# Patient Record
Sex: Female | Born: 1950 | Race: White | Hispanic: No | Marital: Married | Smoking: Former smoker
Health system: Southern US, Community
[De-identification: ages and names within clinical notes are randomized; demographics above are authoritative.]

## PROBLEM LIST (undated history)

## (undated) DIAGNOSIS — G20 Parkinson's disease: Secondary | ICD-10-CM

## (undated) DIAGNOSIS — G20A1 Parkinson's disease without dyskinesia, without mention of fluctuations: Secondary | ICD-10-CM

## (undated) DIAGNOSIS — E039 Hypothyroidism, unspecified: Secondary | ICD-10-CM

## (undated) DIAGNOSIS — E119 Type 2 diabetes mellitus without complications: Secondary | ICD-10-CM

## (undated) DIAGNOSIS — F319 Bipolar disorder, unspecified: Secondary | ICD-10-CM

## (undated) HISTORY — PX: ABDOMINAL HYSTERECTOMY: SHX81

## (undated) HISTORY — PX: CHOLECYSTECTOMY: SHX55

## (undated) HISTORY — PX: TONSILLECTOMY: SUR1361

---

## 2002-02-11 ENCOUNTER — Emergency Department (HOSPITAL_COMMUNITY): Admission: EM | Admit: 2002-02-11 | Discharge: 2002-02-11 | Payer: Self-pay | Admitting: Emergency Medicine

## 2004-09-08 ENCOUNTER — Ambulatory Visit: Payer: Self-pay | Admitting: Family Medicine

## 2004-11-20 ENCOUNTER — Ambulatory Visit: Payer: Self-pay | Admitting: Family Medicine

## 2004-12-08 ENCOUNTER — Ambulatory Visit: Payer: Self-pay | Admitting: Family Medicine

## 2005-01-27 ENCOUNTER — Ambulatory Visit: Payer: Self-pay | Admitting: Family Medicine

## 2005-05-12 ENCOUNTER — Ambulatory Visit: Payer: Self-pay | Admitting: Family Medicine

## 2005-11-23 ENCOUNTER — Ambulatory Visit: Payer: Self-pay | Admitting: Family Medicine

## 2006-01-08 ENCOUNTER — Ambulatory Visit: Payer: Self-pay | Admitting: Family Medicine

## 2015-12-29 ENCOUNTER — Encounter (HOSPITAL_COMMUNITY): Payer: Self-pay

## 2015-12-29 ENCOUNTER — Emergency Department (HOSPITAL_COMMUNITY): Payer: Medicaid Other

## 2015-12-29 ENCOUNTER — Emergency Department (HOSPITAL_COMMUNITY)
Admission: EM | Admit: 2015-12-29 | Discharge: 2015-12-30 | Disposition: A | Discharge status: Home / Self Care | Payer: Medicaid Other | Attending: Emergency Medicine | Admitting: Emergency Medicine

## 2015-12-29 DIAGNOSIS — W010XXA Fall on same level from slipping, tripping and stumbling without subsequent striking against object, initial encounter: Secondary | ICD-10-CM | POA: Insufficient documentation

## 2015-12-29 DIAGNOSIS — I10 Essential (primary) hypertension: Secondary | ICD-10-CM | POA: Diagnosis not present

## 2015-12-29 DIAGNOSIS — Z880 Allergy status to penicillin: Secondary | ICD-10-CM | POA: Insufficient documentation

## 2015-12-29 DIAGNOSIS — S40021A Contusion of right upper arm, initial encounter: Secondary | ICD-10-CM

## 2015-12-29 DIAGNOSIS — Y998 Other external cause status: Secondary | ICD-10-CM | POA: Insufficient documentation

## 2015-12-29 DIAGNOSIS — E1165 Type 2 diabetes mellitus with hyperglycemia: Secondary | ICD-10-CM | POA: Diagnosis present

## 2015-12-29 DIAGNOSIS — Y9389 Activity, other specified: Secondary | ICD-10-CM | POA: Insufficient documentation

## 2015-12-29 DIAGNOSIS — R739 Hyperglycemia, unspecified: Secondary | ICD-10-CM

## 2015-12-29 DIAGNOSIS — Y9289 Other specified places as the place of occurrence of the external cause: Secondary | ICD-10-CM | POA: Insufficient documentation

## 2015-12-29 HISTORY — DX: Type 2 diabetes mellitus without complications: E11.9

## 2015-12-29 HISTORY — DX: Parkinson's disease without dyskinesia, without mention of fluctuations: G20.A1

## 2015-12-29 HISTORY — DX: Parkinson's disease: G20

## 2015-12-29 LAB — CBG MONITORING, ED: Glucose-Capillary: 347 mg/dL — ABNORMAL HIGH (ref 65–99)

## 2015-12-29 MED ORDER — ONDANSETRON HCL 4 MG/2ML IJ SOLN
4.0000 mg | Freq: Once | INTRAMUSCULAR | Status: AC
  Administered 2015-12-29: 4 mg via INTRAVENOUS
  Filled 2015-12-29: qty 2

## 2015-12-29 MED ORDER — FENTANYL CITRATE (PF) 100 MCG/2ML IJ SOLN
50.0000 ug | Freq: Once | INTRAMUSCULAR | Status: AC
  Administered 2015-12-29: 50 ug via INTRAVENOUS
  Filled 2015-12-29: qty 2

## 2015-12-29 MED ORDER — INSULIN ASPART 100 UNIT/ML ~~LOC~~ SOLN
10.0000 [IU] | Freq: Once | SUBCUTANEOUS | Status: AC
  Administered 2015-12-29: 10 [IU] via INTRAVENOUS
  Filled 2015-12-29: qty 1

## 2015-12-29 MED ORDER — SODIUM CHLORIDE 0.9 % IV BOLUS (SEPSIS)
1000.0000 mL | Freq: Once | INTRAVENOUS | Status: AC
  Administered 2015-12-29: 1000 mL via INTRAVENOUS

## 2015-12-29 NOTE — ED Provider Notes (Signed)
CSN: [id number]     Arrival date & time 12/29/15  2218 History  By signing my name below, I, Linus GalasMaharshi Patel, attest that this documentation has been prepared under the direction and in the presence of Raeford RazorStephen Valery Chance, MD. Electronically Signed: Linus GalasMaharshi Patel, ED Scribe. 12/29/2015. 11:16 PM.   Chief Complaint  Patient presents with  . Arm Pain  . Hypoglycemia   The history is provided by the patient. No language interpreter was used.   HPI Comments: Alexis Sanders is a 65 y.o. female with a PMHx of DM and HTN  presents to the Emergency Department for an evaluation s/p fall 1 hour prior to arrival. Pt states as she was getting up from a seated position, she lost her balance and fell. Since then she reports right elbow pain. She denies any head injury. Pt denies any HA, neck pain, back pain, fever, chills, nausea, or any other symptoms at this time.     Pt also reports hyperglycemia.   No past medical history on file. No past surgical history on file. No family history on file. Social History  Substance Use Topics  . Smoking status: Not on file  . Smokeless tobacco: Not on file  . Alcohol Use: Not on file   OB History    No data available     Review of Systems  Constitutional: Negative for fever and chills.  Gastrointestinal: Negative for nausea.  Musculoskeletal: Positive for arthralgias. Negative for back pain and neck pain.  Neurological: Negative for headaches.   Allergies  Codeine and Penicillins  Home Medications   Prior to Admission medications   Not on File   BP 158/119 mmHg  Pulse 105  Temp(Src) 98 F (36.7 C)  Resp 19  SpO2 95%   Physical Exam  Constitutional: She is oriented to person, place, and time. She appears well-developed and well-nourished.  HENT:  Head: Normocephalic and atraumatic.  Mouth/Throat: Oropharynx is clear and moist and mucous membranes are normal.  Neck: Normal range of motion. Neck supple.  Cardiovascular: Normal rate, regular  rhythm, normal heart sounds and intact distal pulses.   Pulmonary/Chest: Effort normal and breath sounds normal. No respiratory distress.  Abdominal: Soft. Bowel sounds are normal. She exhibits no distension.  Musculoskeletal: Normal range of motion. She exhibits no edema or tenderness.  Mild tenderness to the distal right humerus above the elbow; no apparent pain with ROM of elbow; no deformity; NVID  Neurological: She is alert and oriented to person, place, and time. She exhibits normal muscle tone. Coordination normal.  Skin: Skin is warm and dry.  Psychiatric: She has a normal mood and affect.  Nursing note and vitals reviewed.   ED Course  Procedures   DIAGNOSTIC STUDIES: Oxygen Saturation is 95% on room air, normal by my interpretation.    COORDINATION OF CARE: 11:11 PM Will give zofran, fluids, insulin, and pain medication. Will order right elbow xray, blood work and urinalysis. Discussed treatment plan with pt at bedside and pt agreed to plan.   Labs Review Labs Reviewed  BASIC METABOLIC PANEL - Abnormal; Notable for the following:    Glucose, Bld 386 (*)    Creatinine, Ser 1.28 (*)    GFR calc non Af Amer 43 (*)    GFR calc Af Amer 50 (*)    All other components within normal limits  CBC - Abnormal; Notable for the following:    WBC 12.3 (*)    Platelets 121 (*)    All other  components within normal limits  URINALYSIS, ROUTINE W REFLEX MICROSCOPIC (NOT AT [facility]) - Abnormal; Notable for the following:    APPearance CLOUDY (*)    Glucose, UA >1000 (*)    Leukocytes, UA SMALL (*)    All other components within normal limits  URINE MICROSCOPIC-ADD ON - Abnormal; Notable for the following:    Squamous Epithelial / LPF 6-30 (*)    Bacteria, UA RARE (*)    All other components within normal limits  CBG MONITORING, ED - Abnormal; Notable for the following:    Glucose-Capillary 347 (*)    All other components within normal limits  CBG MONITORING, ED - Abnormal; Notable for  the following:    Glucose-Capillary 199 (*)    All other components within normal limits    Imaging Review No results found. I have personally reviewed and evaluated these images and lab results as part of my medical decision-making.   EKG Interpretation None      MDM   Final diagnoses:  Arm contusion, right, initial encounter  Hyperglycemia    64yF with R arm pain after mechanical fall. Closed injury. NVI. Negative imaging. Plan symptomatic tx of what is clinically a contusion.  I personally preformed the services scribed in my presence. The recorded information has been reviewed is accurate. Raeford Razor, MD.    Raeford Razor, MD 01/03/16 [place] Moment

## 2015-12-29 NOTE — ED Notes (Signed)
[facility], pt tripped and fell with 10/10 pain right arm. No head injury reported. No deformity. CBG of 398. Last BP of 212/100.

## 2015-12-29 NOTE — ED Notes (Signed)
Patient in XR, unable to reassess pain at this time.

## 2015-12-29 NOTE — ED Notes (Signed)
Family at bedside. 

## 2015-12-29 NOTE — ED Notes (Signed)
Bed: WA07 Expected date:  Expected time:  Means of arrival:  Comments: EMS 64yo F fall / arm pain

## 2015-12-30 ENCOUNTER — Encounter (HOSPITAL_COMMUNITY): Payer: Self-pay | Admitting: Emergency Medicine

## 2015-12-30 LAB — CBG MONITORING, ED: Glucose-Capillary: 199 mg/dL — ABNORMAL HIGH (ref 65–99)

## 2015-12-30 LAB — CBC
HEMATOCRIT: 37.9 % (ref 36.0–46.0)
Hemoglobin: 12.9 g/dL (ref 12.0–15.0)
MCH: 28.4 pg (ref 26.0–34.0)
MCHC: 34 g/dL (ref 30.0–36.0)
MCV: 83.3 fL (ref 78.0–100.0)
PLATELETS: 121 10*3/uL — AB (ref 150–400)
RBC: 4.55 MIL/uL (ref 3.87–5.11)
RDW: 13.1 % (ref 11.5–15.5)
WBC: 12.3 10*3/uL — AB (ref 4.0–10.5)

## 2015-12-30 LAB — BASIC METABOLIC PANEL
Anion gap: 12 (ref 5–15)
BUN: 17 mg/dL (ref 6–20)
CHLORIDE: 101 mmol/L (ref 101–111)
CO2: 25 mmol/L (ref 22–32)
CREATININE: 1.28 mg/dL — AB (ref 0.44–1.00)
Calcium: 9.2 mg/dL (ref 8.9–10.3)
GFR calc Af Amer: 50 mL/min — ABNORMAL LOW (ref >60)
GFR calc non Af Amer: 43 mL/min — ABNORMAL LOW (ref >60)
GLUCOSE: 386 mg/dL — AB (ref 65–99)
POTASSIUM: 4.3 mmol/L (ref 3.5–5.1)
SODIUM: 138 mmol/L (ref 135–145)

## 2015-12-30 LAB — URINALYSIS, ROUTINE W REFLEX MICROSCOPIC
BILIRUBIN URINE: NEGATIVE
HGB URINE DIPSTICK: NEGATIVE
KETONES UR: NEGATIVE mg/dL
Nitrite: NEGATIVE
PH: 7 (ref 5.0–8.0)
PROTEIN: NEGATIVE mg/dL
Specific Gravity, Urine: 1.01 (ref 1.005–1.030)

## 2015-12-30 LAB — URINE MICROSCOPIC-ADD ON

## 2015-12-30 MED ORDER — TRAMADOL HCL 50 MG PO TABS: 50.0000 mg | ORAL_TABLET | Freq: Four times a day (QID) | ORAL | Status: DC | PRN

## 2015-12-30 MED ORDER — MORPHINE SULFATE (PF) 4 MG/ML IV SOLN
4.0000 mg | Freq: Once | INTRAVENOUS | Status: AC
  Administered 2015-12-30: 4 mg via INTRAVENOUS
  Filled 2015-12-30: qty 1

## 2015-12-30 NOTE — Discharge Instructions (Signed)
Contusion °A contusion is a deep bruise. Contusions are the result of a blunt injury to tissues and muscle fibers under the skin. The injury causes bleeding under the skin. The skin overlying the contusion may turn blue, purple, or yellow. Minor injuries will give you a painless contusion, but more severe contusions may stay painful and swollen for a few weeks.  °CAUSES  °This condition is usually caused by a blow, trauma, or direct force to an area of the body. °SYMPTOMS  °Symptoms of this condition include: °· Swelling of the injured area. °· Pain and tenderness in the injured area. °· Discoloration. The area may have redness and then turn blue, purple, or yellow. °DIAGNOSIS  °This condition is diagnosed based on a physical exam and medical history. An X-ray, CT scan, or MRI may be needed to determine if there are any associated injuries, such as broken bones (fractures). °TREATMENT  °Specific treatment for this condition depends on what area of the body was injured. In general, the best treatment for a contusion is resting, icing, applying pressure to (compression), and elevating the injured area. This is often called the RICE strategy. Over-the-counter anti-inflammatory medicines may also be recommended for pain control.  °HOME CARE INSTRUCTIONS  °· Rest the injured area. °· If directed, apply ice to the injured area: °· Put ice in a plastic bag. °· Place a towel between your skin and the bag. °· Leave the ice on for 20 minutes, 2-3 times per day. °· If directed, apply light compression to the injured area using an elastic bandage. Make sure the bandage is not wrapped too tightly. Remove and reapply the bandage as directed by your health care provider. °· If possible, raise (elevate) the injured area above the level of your heart while you are sitting or lying down. °· Take over-the-counter and prescription medicines only as told by your health care provider. °SEEK MEDICAL CARE IF: °· Your symptoms do not  improve after several days of treatment. °· Your symptoms get worse. °· You have difficulty moving the injured area. °SEEK IMMEDIATE MEDICAL CARE IF:  °· You have severe pain. °· You have numbness in a hand or foot. °· Your hand or foot turns pale or cold. °  °This information is not intended to replace advice given to you by your health care provider. Make sure you discuss any questions you have with your health care provider. °  °Document Released: 07/15/2005 Document Revised: 06/26/2015 Document Reviewed: 02/20/2015 °Elsevier Interactive Patient Education ©2016 [employer] °Hyperglycemia °Hyperglycemia occurs when the glucose (sugar) in your blood is too high. Hyperglycemia can happen for many reasons, but it most often happens to people who do not know they have diabetes or are not managing their diabetes properly.  °CAUSES  °Whether you have diabetes or not, there are other causes of hyperglycemia. Hyperglycemia can occur when you have diabetes, but it can also occur in other situations that you might not be as aware of, such as: °Diabetes °· If you have diabetes and are having problems controlling your blood glucose, hyperglycemia could occur because of some of the following reasons: °¨ Not following your meal plan. °¨ Not taking your diabetes medications or not taking it properly. °¨ Exercising less or doing less activity than you normally do. °¨ Being sick. °Pre-diabetes °· This cannot be ignored. Before people develop Type 2 diabetes, they almost always have "pre-diabetes." This is when your blood glucose levels are higher than normal, but not yet high enough   to be diagnosed as diabetes. Research has shown that some long-term damage to the body, especially the heart and circulatory system, may already be occurring during pre-diabetes. If you take action to manage your blood glucose when you have pre-diabetes, you may delay or prevent Type 2 diabetes from developing. °Stress °· If you have diabetes, you  may be "diet" controlled or on oral medications or insulin to control your diabetes. However, you may find that your blood glucose is higher than usual in the hospital whether you have diabetes or not. This is often referred to as "stress hyperglycemia." Stress can elevate your blood glucose. This happens because of hormones put out by the body during times of stress. If stress has been the cause of your high blood glucose, it can be followed regularly by your caregiver. That way he/she can make sure your hyperglycemia does not continue to get worse or progress to diabetes. °Steroids °· Steroids are medications that act on the infection fighting system (immune system) to block inflammation or infection. One side effect can be a rise in blood glucose. Most people can produce enough extra insulin to allow for this rise, but for those who cannot, steroids make blood glucose levels go even higher. It is not unusual for steroid treatments to "uncover" diabetes that is developing. It is not always possible to determine if the hyperglycemia will go away after the steroids are stopped. A special blood test called an A1c is sometimes done to determine if your blood glucose was elevated before the steroids were started. °SYMPTOMS °· Thirsty. °· Frequent urination. °· Dry mouth. °· Blurred vision. °· Tired or fatigue. °· Weakness. °· Sleepy. °· Tingling in feet or leg. °DIAGNOSIS  °Diagnosis is made by monitoring blood glucose in one or all of the following ways: °· A1c test. This is a chemical found in your blood. °· Fingerstick blood glucose monitoring. °· Laboratory results. °TREATMENT  °First, knowing the cause of the hyperglycemia is important before the hyperglycemia can be treated. Treatment may include, but is not be limited to: °· Education. °· Change or adjustment in medications. °· Change or adjustment in meal plan. °· Treatment for an illness, infection, etc. °· More frequent blood glucose monitoring. °· Change in  exercise plan. °· Decreasing or stopping steroids. °· Lifestyle changes. °HOME CARE INSTRUCTIONS  °· Test your blood glucose as directed. °· Exercise regularly. Your caregiver will give you instructions about exercise. Pre-diabetes or diabetes which comes on with stress is helped by exercising. °· Eat wholesome, balanced meals. Eat often and at regular, fixed times. Your caregiver or nutritionist will give you a meal plan to guide your sugar intake. °· Being at an ideal weight is important. If needed, losing as little as 10 to 15 pounds may help improve blood glucose levels. °SEEK MEDICAL CARE IF:  °· You have questions about medicine, activity, or diet. °· You continue to have symptoms (problems such as increased thirst, urination, or weight gain). °SEEK IMMEDIATE MEDICAL CARE IF:  °· You are vomiting or have diarrhea. °· Your breath smells fruity. °· You are breathing faster or slower. °· You are very sleepy or incoherent. °· You have numbness, tingling, or pain in your feet or hands. °· You have chest pain. °· Your symptoms get worse even though you have been following your caregiver's orders. °· If you have any other questions or concerns. °  °This information is not intended to replace advice given to you by your health care provider.   Make sure you discuss any questions you have with your health care provider. °  °Document Released: 03/31/2001 Document Revised: 12/28/2011 Document Reviewed: 06/11/2015 °Elsevier Interactive Patient Education ©2016 Elsevier Inc. ° °

## 2016-08-17 ENCOUNTER — Encounter (HOSPITAL_COMMUNITY): Payer: Self-pay

## 2016-08-17 ENCOUNTER — Emergency Department (HOSPITAL_COMMUNITY): Payer: Medicare Other

## 2016-08-17 ENCOUNTER — Inpatient Hospital Stay (HOSPITAL_COMMUNITY)
Admission: EM | Admit: 2016-08-17 | Discharge: 2016-09-04 | DRG: 871 | Disposition: A | Discharge status: Skilled Nursing Facility | Payer: Medicare Other | Attending: Family Medicine | Admitting: Family Medicine

## 2016-08-17 DIAGNOSIS — E039 Hypothyroidism, unspecified: Secondary | ICD-10-CM | POA: Diagnosis present

## 2016-08-17 DIAGNOSIS — G9340 Encephalopathy, unspecified: Secondary | ICD-10-CM | POA: Diagnosis present

## 2016-08-17 DIAGNOSIS — A419 Sepsis, unspecified organism: Secondary | ICD-10-CM | POA: Diagnosis present

## 2016-08-17 DIAGNOSIS — D689 Coagulation defect, unspecified: Secondary | ICD-10-CM | POA: Diagnosis present

## 2016-08-17 DIAGNOSIS — J690 Pneumonitis due to inhalation of food and vomit: Secondary | ICD-10-CM | POA: Diagnosis present

## 2016-08-17 DIAGNOSIS — N183 Chronic kidney disease, stage 3 unspecified: Secondary | ICD-10-CM

## 2016-08-17 DIAGNOSIS — E876 Hypokalemia: Secondary | ICD-10-CM | POA: Diagnosis present

## 2016-08-17 DIAGNOSIS — N179 Acute kidney failure, unspecified: Secondary | ICD-10-CM | POA: Diagnosis present

## 2016-08-17 DIAGNOSIS — G2119 Other drug induced secondary parkinsonism: Secondary | ICD-10-CM | POA: Diagnosis present

## 2016-08-17 DIAGNOSIS — E878 Other disorders of electrolyte and fluid balance, not elsewhere classified: Secondary | ICD-10-CM | POA: Diagnosis present

## 2016-08-17 DIAGNOSIS — E1151 Type 2 diabetes mellitus with diabetic peripheral angiopathy without gangrene: Secondary | ICD-10-CM | POA: Diagnosis present

## 2016-08-17 DIAGNOSIS — G20A1 Parkinson's disease without dyskinesia, without mention of fluctuations: Secondary | ICD-10-CM | POA: Diagnosis present

## 2016-08-17 DIAGNOSIS — F419 Anxiety disorder, unspecified: Secondary | ICD-10-CM | POA: Diagnosis present

## 2016-08-17 DIAGNOSIS — E1122 Type 2 diabetes mellitus with diabetic chronic kidney disease: Secondary | ICD-10-CM | POA: Diagnosis present

## 2016-08-17 DIAGNOSIS — Z87891 Personal history of nicotine dependence: Secondary | ICD-10-CM

## 2016-08-17 DIAGNOSIS — N390 Urinary tract infection, site not specified: Secondary | ICD-10-CM | POA: Diagnosis present

## 2016-08-17 DIAGNOSIS — Z885 Allergy status to narcotic agent status: Secondary | ICD-10-CM

## 2016-08-17 DIAGNOSIS — E1165 Type 2 diabetes mellitus with hyperglycemia: Secondary | ICD-10-CM | POA: Diagnosis present

## 2016-08-17 DIAGNOSIS — Z794 Long term (current) use of insulin: Secondary | ICD-10-CM

## 2016-08-17 DIAGNOSIS — E869 Volume depletion, unspecified: Secondary | ICD-10-CM | POA: Diagnosis present

## 2016-08-17 DIAGNOSIS — R509 Fever, unspecified: Secondary | ICD-10-CM | POA: Diagnosis not present

## 2016-08-17 DIAGNOSIS — N170 Acute kidney failure with tubular necrosis: Secondary | ICD-10-CM | POA: Diagnosis not present

## 2016-08-17 DIAGNOSIS — J9601 Acute respiratory failure with hypoxia: Secondary | ICD-10-CM

## 2016-08-17 DIAGNOSIS — Z789 Other specified health status: Secondary | ICD-10-CM

## 2016-08-17 DIAGNOSIS — E43 Unspecified severe protein-calorie malnutrition: Secondary | ICD-10-CM | POA: Diagnosis present

## 2016-08-17 DIAGNOSIS — Z888 Allergy status to other drugs, medicaments and biological substances status: Secondary | ICD-10-CM

## 2016-08-17 DIAGNOSIS — F319 Bipolar disorder, unspecified: Secondary | ICD-10-CM | POA: Diagnosis present

## 2016-08-17 DIAGNOSIS — M6281 Muscle weakness (generalized): Secondary | ICD-10-CM

## 2016-08-17 DIAGNOSIS — A4102 Sepsis due to Methicillin resistant Staphylococcus aureus: Principal | ICD-10-CM | POA: Diagnosis present

## 2016-08-17 DIAGNOSIS — J9600 Acute respiratory failure, unspecified whether with hypoxia or hypercapnia: Secondary | ICD-10-CM | POA: Diagnosis not present

## 2016-08-17 DIAGNOSIS — E038 Other specified hypothyroidism: Secondary | ICD-10-CM | POA: Diagnosis not present

## 2016-08-17 DIAGNOSIS — G20 Parkinson's disease: Secondary | ICD-10-CM | POA: Diagnosis present

## 2016-08-17 DIAGNOSIS — R6521 Severe sepsis with septic shock: Secondary | ICD-10-CM | POA: Diagnosis present

## 2016-08-17 DIAGNOSIS — E875 Hyperkalemia: Secondary | ICD-10-CM | POA: Diagnosis present

## 2016-08-17 DIAGNOSIS — R1310 Dysphagia, unspecified: Secondary | ICD-10-CM | POA: Diagnosis not present

## 2016-08-17 DIAGNOSIS — D6489 Other specified anemias: Secondary | ICD-10-CM | POA: Diagnosis present

## 2016-08-17 DIAGNOSIS — E119 Type 2 diabetes mellitus without complications: Secondary | ICD-10-CM

## 2016-08-17 DIAGNOSIS — Z7984 Long term (current) use of oral hypoglycemic drugs: Secondary | ICD-10-CM

## 2016-08-17 DIAGNOSIS — Z7189 Other specified counseling: Secondary | ICD-10-CM

## 2016-08-17 DIAGNOSIS — D638 Anemia in other chronic diseases classified elsewhere: Secondary | ICD-10-CM | POA: Diagnosis present

## 2016-08-17 DIAGNOSIS — Z880 Allergy status to penicillin: Secondary | ICD-10-CM

## 2016-08-17 DIAGNOSIS — R0602 Shortness of breath: Secondary | ICD-10-CM

## 2016-08-17 DIAGNOSIS — Z79899 Other long term (current) drug therapy: Secondary | ICD-10-CM

## 2016-08-17 DIAGNOSIS — E870 Hyperosmolality and hypernatremia: Secondary | ICD-10-CM | POA: Diagnosis present

## 2016-08-17 DIAGNOSIS — G9341 Metabolic encephalopathy: Secondary | ICD-10-CM | POA: Diagnosis present

## 2016-08-17 DIAGNOSIS — Z515 Encounter for palliative care: Secondary | ICD-10-CM | POA: Diagnosis present

## 2016-08-17 DIAGNOSIS — R1312 Dysphagia, oropharyngeal phase: Secondary | ICD-10-CM | POA: Diagnosis present

## 2016-08-17 DIAGNOSIS — R0600 Dyspnea, unspecified: Secondary | ICD-10-CM

## 2016-08-17 DIAGNOSIS — J9690 Respiratory failure, unspecified, unspecified whether with hypoxia or hypercapnia: Secondary | ICD-10-CM

## 2016-08-17 DIAGNOSIS — Z4659 Encounter for fitting and adjustment of other gastrointestinal appliance and device: Secondary | ICD-10-CM

## 2016-08-17 HISTORY — DX: Hypothyroidism, unspecified: E03.9

## 2016-08-17 LAB — COMPREHENSIVE METABOLIC PANEL
ALT: 20 U/L (ref 14–54)
AST: 24 U/L (ref 15–41)
Albumin: 2.9 g/dL — ABNORMAL LOW (ref 3.5–5.0)
Alkaline Phosphatase: 136 U/L — ABNORMAL HIGH (ref 38–126)
Anion gap: 10 (ref 5–15)
BUN: 31 mg/dL — ABNORMAL HIGH (ref 6–20)
CHLORIDE: 109 mmol/L (ref 101–111)
CO2: 23 mmol/L (ref 22–32)
CREATININE: 2.26 mg/dL — AB (ref 0.44–1.00)
Calcium: 9.6 mg/dL (ref 8.9–10.3)
GFR calc non Af Amer: 22 mL/min — ABNORMAL LOW (ref >60)
GFR, EST AFRICAN AMERICAN: 25 mL/min — AB (ref >60)
Glucose, Bld: 116 mg/dL — ABNORMAL HIGH (ref 65–99)
Potassium: 4.5 mmol/L (ref 3.5–5.1)
SODIUM: 142 mmol/L (ref 135–145)
Total Bilirubin: 0.8 mg/dL (ref 0.3–1.2)
Total Protein: 7.1 g/dL (ref 6.5–8.1)

## 2016-08-17 LAB — CBC WITH DIFFERENTIAL/PLATELET
Basophils Absolute: 0 10*3/uL (ref 0.0–0.1)
Basophils Relative: 0 %
EOS ABS: 0 10*3/uL (ref 0.0–0.7)
Eosinophils Relative: 0 %
HEMATOCRIT: 31.5 % — AB (ref 36.0–46.0)
HEMOGLOBIN: 10.1 g/dL — AB (ref 12.0–15.0)
LYMPHS ABS: 0.8 10*3/uL (ref 0.7–4.0)
LYMPHS PCT: 11 %
MCH: 26.6 pg (ref 26.0–34.0)
MCHC: 32.1 g/dL (ref 30.0–36.0)
MCV: 83.1 fL (ref 78.0–100.0)
MONOS PCT: 8 %
Monocytes Absolute: 0.6 10*3/uL (ref 0.1–1.0)
NEUTROS ABS: 5.4 10*3/uL (ref 1.7–7.7)
NEUTROS PCT: 81 %
Platelets: 198 10*3/uL (ref 150–400)
RBC: 3.79 MIL/uL — ABNORMAL LOW (ref 3.87–5.11)
RDW: 13.6 % (ref 11.5–15.5)
WBC: 6.8 10*3/uL (ref 4.0–10.5)

## 2016-08-17 LAB — URINALYSIS, ROUTINE W REFLEX MICROSCOPIC
BILIRUBIN URINE: NEGATIVE
Glucose, UA: NEGATIVE mg/dL
HGB URINE DIPSTICK: NEGATIVE
Ketones, ur: 15 mg/dL — AB
Nitrite: NEGATIVE
PH: 5.5 (ref 5.0–8.0)
Protein, ur: NEGATIVE mg/dL
SPECIFIC GRAVITY, URINE: 1.012 (ref 1.005–1.030)

## 2016-08-17 LAB — URINE MICROSCOPIC-ADD ON
BACTERIA UA: NONE SEEN
RBC / HPF: NONE SEEN RBC/hpf (ref 0–5)

## 2016-08-17 LAB — I-STAT CG4 LACTIC ACID, ED
LACTIC ACID, VENOUS: 1.78 mmol/L (ref 0.5–1.9)
LACTIC ACID, VENOUS: 0.6 mmol/L (ref 0.5–1.9)

## 2016-08-17 MED ORDER — DEXTROSE 5 % IV SOLN
500.0000 mg | INTRAVENOUS | Status: DC
  Administered 2016-08-18 – 2016-08-20 (×3): 500 mg via INTRAVENOUS
  Filled 2016-08-17 (×6): qty 500

## 2016-08-17 MED ORDER — DEXTROSE 5 % IV SOLN
1.0000 g | Freq: Once | INTRAVENOUS | Status: AC
  Administered 2016-08-17: 1 g via INTRAVENOUS
  Filled 2016-08-17: qty 10

## 2016-08-17 MED ORDER — DEXTROSE 5 % IV SOLN
1.0000 g | INTRAVENOUS | Status: DC
  Filled 2016-08-17: qty 10

## 2016-08-17 MED ORDER — SODIUM CHLORIDE 0.9 % IV BOLUS (SEPSIS)
1500.0000 mL | Freq: Once | INTRAVENOUS | Status: AC
  Administered 2016-08-17: 1500 mL via INTRAVENOUS

## 2016-08-17 MED ORDER — DEXTROSE 5 % IV SOLN
500.0000 mg | Freq: Once | INTRAVENOUS | Status: AC
  Administered 2016-08-17: 500 mg via INTRAVENOUS
  Filled 2016-08-17: qty 500

## 2016-08-17 NOTE — ED Provider Notes (Addendum)
[facility]-EMERGENCY DEPT Provider Note   CSN: [id number] Arrival date & time: 08/17/16  1725     History   Chief Complaint Chief Complaint  Patient presents with  . Altered Mental Status  . Shortness of Breath  Level V caveat altered mental status history obtained from patient's husband who accompanies her and from records are coming patient. Patient has been more confused, less verbal over the past 3 days. She's also had slight cough. [facility], treated for community-acquired pneumonia with prescriptions for albuterol Levaquin and promethazine. She presents here by EMS treated by EMS with supplemental oxygen. She presents as she remains confused with speech unintelligible. She's had no vomiting no other associated symptoms  HPI Alexis Sanders is a 65 y.o. female.  HPI  Past Medical History:  Diagnosis Date  . Diabetes mellitus without complication (HCC)   . Parkinson disease (HCC)     There are no active problems to display for this patient.   Past Surgical History:  Procedure Laterality Date  . ABDOMINAL HYSTERECTOMY    . CHOLECYSTECTOMY    . TONSILLECTOMY      OB History    No data available       Home Medications    Prior to Admission medications   Medication Sig Start Date End Date Taking? Authorizing Provider  amantadine (SYMMETREL) 100 MG capsule Take 100 mg by mouth 2 (two) times daily.   Yes Historical Provider, MD  ARIPiprazole (ABILIFY) 10 MG tablet Take 10 mg by mouth every evening.   Yes Historical Provider, MD  buPROPion (WELLBUTRIN) 100 MG tablet Take 100 mg by mouth daily.   Yes Historical Provider, MD  clonazePAM (KLONOPIN) 1 MG tablet Take 1 mg by mouth 2 (two) times daily.   Yes Historical Provider, MD  DULoxetine (CYMBALTA) 30 MG capsule Take 30 mg by mouth 2 (two) times daily.   Yes Historical Provider, MD  gabapentin (NEURONTIN) 300 MG capsule Take 600 mg by mouth 2 (two) times daily.   Yes Historical Provider,  MD  glipiZIDE (GLUCOTROL XL) 2.5 MG 24 hr tablet Take 2.5 mg by mouth daily with breakfast.   Yes Historical Provider, MD  levofloxacin (LEVAQUIN) 750 MG tablet Take 750 mg by mouth daily.   Yes Historical Provider, MD  levothyroxine (SYNTHROID, LEVOTHROID) 125 MCG tablet Take 125 mcg by mouth daily before breakfast.   Yes Historical Provider, MD  montelukast (SINGULAIR) 10 MG tablet Take 10 mg by mouth at bedtime.   Yes Historical Provider, MD  pantoprazole (PROTONIX) 40 MG tablet Take 40 mg by mouth every evening.   Yes Historical Provider, MD  promethazine (PHENERGAN) 25 MG tablet Take 25 mg by mouth every 6 (six) hours as needed for nausea or vomiting.   Yes Historical Provider, MD  solifenacin (VESICARE) 5 MG tablet Take 5 mg by mouth daily.   Yes Historical Provider, MD  traZODone (DESYREL) 50 MG tablet Take 25 mg by mouth at bedtime.   Yes Historical Provider, MD    Family History History reviewed. No pertinent family history.  Social History Social History  Substance Use Topics  . Smoking status: Never Smoker  . Smokeless tobacco: Never Used  . Alcohol use No     Allergies   Codeine; Metformin; and Penicillins   Review of Systems Review of Systems  Unable to perform ROS: Mental status change  Respiratory: Positive for cough.   Allergic/Immunologic: Positive for immunocompromised state.  Diabetic  Neurological: Positive for tremors.       Chronic tremor     Physical Exam Updated Vital Signs BP 110/76   Pulse 87   Temp 100.2 F (37.9 C) (Rectal)   Resp 22   SpO2 98%   Physical Exam  Constitutional:  Chronically and acutely ill-appearing  HENT:  Head: Normocephalic and atraumatic.  Mucous membranes dry  Eyes: Conjunctivae are normal. Pupils are equal, round, and reactive to light.  Neck: Neck supple. No tracheal deviation present. No thyromegaly present.  Cardiovascular: Normal rate and regular rhythm.   No murmur heard. Pulmonary/Chest: Effort  normal and breath sounds normal.  Abdominal: Soft. Bowel sounds are normal. She exhibits no distension. There is no tenderness.  Musculoskeletal: Normal range of motion. She exhibits no edema or tenderness.  Neurological: She is alert. Coordination normal.  moves all extremities, tremor at rest. Speech unintelligible  Skin: Skin is warm and dry. No rash noted.  Psychiatric: She has a normal mood and affect.  Nursing note and vitals reviewed.  Radiology department called. Yesterday's chest x-ray report consistent with basilar densities concerning for pneumonia  ED Treatments / Results  Labs (all labs ordered are listed, but only abnormal results are displayed) Labs Reviewed  CULTURE, BLOOD (ROUTINE X 2)  CULTURE, BLOOD (ROUTINE X 2)  URINE CULTURE  COMPREHENSIVE METABOLIC PANEL  CBC WITH DIFFERENTIAL/PLATELET  URINALYSIS, ROUTINE W REFLEX MICROSCOPIC (NOT AT [facility])  I-STAT CG4 LACTIC ACID, ED    EKG  EKG Interpretation  Date/Time:  Monday August 17 2016 17:50:03 EDT Ventricular Rate:  96 PR Interval:    QRS Duration: 104 QT Interval:  375 QTC Calculation: 474 R Axis:   5 Text Interpretation:  Sinus rhythm Probable left ventricular hypertrophy Baseline wander in lead(s) V6 Confirmed by Ethelda ChickJACUBOWITZ  MD, Mathis Cashman ([id number]) on 08/17/2016 6:12:19 PM       Radiology No results found.  Procedures Procedures (including critical care time)  Medications Ordered in ED Medications  cefTRIAXone (ROCEPHIN) 1 g in dextrose 5 % 50 mL IVPB (not administered)  azithromycin (ZITHROMAX) 500 mg in dextrose 5 % 250 mL IVPB (not administered)    Results for orders placed or performed during the hospital encounter of 08/17/16  Comprehensive metabolic panel  Result Value Ref Range   Sodium 142 135 - 145 mmol/L   Potassium 4.5 3.5 - 5.1 mmol/L   Chloride 109 101 - 111 mmol/L   CO2 23 22 - 32 mmol/L   Glucose, Bld 116 (H) 65 - 99 mg/dL   BUN 31 (H) 6 - 20 mg/dL   Creatinine, Ser 9.142.26 (H) 0.44  - 1.00 mg/dL   Calcium 9.6 8.9 - 78.210.3 mg/dL   Total Protein 7.1 6.5 - 8.1 g/dL   Albumin 2.9 (L) 3.5 - 5.0 g/dL   AST 24 15 - 41 U/L   ALT 20 14 - 54 U/L   Alkaline Phosphatase 136 (H) 38 - 126 U/L   Total Bilirubin 0.8 0.3 - 1.2 mg/dL   GFR calc non Af Amer 22 (L) >60 mL/min   GFR calc Af Amer 25 (L) >60 mL/min   Anion gap 10 5 - 15  CBC WITH DIFFERENTIAL  Result Value Ref Range   WBC 6.8 4.0 - 10.5 K/uL   RBC 3.79 (L) 3.87 - 5.11 MIL/uL   Hemoglobin 10.1 (L) 12.0 - 15.0 g/dL   HCT 95.631.5 (L) 21.336.0 - 08.646.0 %   MCV 83.1 78.0 - 100.0 fL   MCH 26.6  26.0 - 34.0 pg   MCHC 32.1 30.0 - 36.0 g/dL   RDW 16.1 09.6 - 04.5 %   Platelets 198 150 - 400 K/uL   Neutrophils Relative % 81 %   Neutro Abs 5.4 1.7 - 7.7 K/uL   Lymphocytes Relative 11 %   Lymphs Abs 0.8 0.7 - 4.0 K/uL   Monocytes Relative 8 %   Monocytes Absolute 0.6 0.1 - 1.0 K/uL   Eosinophils Relative 0 %   Eosinophils Absolute 0.0 0.0 - 0.7 K/uL   Basophils Relative 0 %   Basophils Absolute 0.0 0.0 - 0.1 K/uL  Urinalysis, Routine w reflex microscopic (not at [facility])  Result Value Ref Range   Color, Urine YELLOW YELLOW   APPearance CLOUDY (A) CLEAR   Specific Gravity, Urine 1.012 1.005 - 1.030   pH 5.5 5.0 - 8.0   Glucose, UA NEGATIVE NEGATIVE mg/dL   Hgb urine dipstick NEGATIVE NEGATIVE   Bilirubin Urine NEGATIVE NEGATIVE   Ketones, ur 15 (A) NEGATIVE mg/dL   Protein, ur NEGATIVE NEGATIVE mg/dL   Nitrite NEGATIVE NEGATIVE   Leukocytes, UA LARGE (A) NEGATIVE  Urine microscopic-add on  Result Value Ref Range   Squamous Epithelial / LPF 6-30 (A) NONE SEEN   WBC, UA TOO NUMEROUS TO COUNT 0 - 5 WBC/hpf   RBC / HPF NONE SEEN 0 - 5 RBC/hpf   Bacteria, UA NONE SEEN NONE SEEN  I-Stat CG4 Lactic Acid, ED  (not at  [facility])  Result Value Ref Range   Lactic Acid, Venous 1.78 0.5 - 1.9 mmol/L   Dg Chest 2 View  Result Date: 08/17/2016 CLINICAL DATA:  Altered mental status and shortness of breath EXAM: CHEST  2 VIEW COMPARISON:   None. FINDINGS: There is patchy airspace consolidation in both lower lobes and right upper lobe regions. Lungs elsewhere clear. Heart is borderline enlarged with pulmonary vascularity within normal limits. No adenopathy. No bone lesions. IMPRESSION: Patchy airspace consolidation bilaterally, somewhat more on the right than on the left. While pneumonia can present in this manner, aspiration is a differential consideration given the overall appearance and distribution of abnormality. There is borderline cardiomegaly. Followup PA and lateral chest radiographs recommended in 3-4 weeks following trial of antibiotic therapy to ensure resolution and exclude underlying malignancy. Electronically Signed   By: Bretta Bang III M.D.   On: 08/17/2016 20:42   Initial Impression / Assessment and Plan / ED Course  I have reviewed the triage vital signs and the nursing notes.  Pertinent labs & imaging results that were available during my care of the patient were reviewed by me and considered in my medical decision making (see chart for details).  Clinical Course   9:15 PM patient is alert and talkative after treatment with intravenous antibiotics. IV fluid bolus ordered total 2.5 L after lab results back. Dr.Kakrakandy consulted. Plan admit step down unit   Final Clinical Impressions(s) / ED Diagnoses  Diagnosis #1 community-acquired pneumonia #2 septic shock #3 acute kidney injury Final diagnoses:  None  #4 anemia New Prescriptions New Prescriptions   No medications on file  CRITICAL CARE Performed by: Doug Sou Total critical care time: 30 minutes Critical care time was exclusive of separately billable procedures and treating other patients. Critical care was necessary to treat or prevent imminent or life-threatening deterioration. Critical care was time spent personally by me on the following activities: development of treatment plan with patient and/or surrogate as well as nursing,  discussions with consultants, evaluation of patient's response  to treatment, examination of patient, obtaining history from patient or surrogate, ordering and performing treatments and interventions, ordering and review of laboratory studies, ordering and review of radiographic studies, pulse oximetry and re-evaluation of patient's condition.   Doug Sou, MD 08/17/16 2122    Doug Sou, MD 08/17/16 2123

## 2016-08-17 NOTE — Progress Notes (Signed)
Pharmacy Antibiotic Note  Alexis HoleBetty S Sanders is a 65 y.o. female admitted on 08/17/2016 with pneumonia.  Pharmacy has been consulted for ceftriaxone and azithromycin dosing. CC AMS and SOB. [facility], dx w/ PNA and given levofloxacin PO for tx.   Plan: Ceftriaxone 1g IV q24h Azithromycin 500 mg q24h   Height: 5\' 2"  (157.5 cm) Weight: 160 lb (72.6 kg) IBW/kg (Calculated) : 50.1  Temp (24hrs), Avg:100.2 F (37.9 C), Min:100.1 F (37.8 C), Max:100.2 F (37.9 C)   Recent Labs Lab 08/17/16 1913  LATICACIDVEN 1.78    CrCl cannot be calculated (Patient's most recent lab result is older than the maximum 21 days allowed.).    Allergies  Allergen Reactions  . Codeine Nausea Only  . Metformin Diarrhea  . Penicillins Rash    Has patient had a PCN reaction causing immediate rash, facial/tongue/throat swelling, SOB or lightheadedness with hypotension:Yes Has patient had a PCN reaction causing severe rash involving mucus membranes or skin necrosis:unsure. Has patient had a PCN reaction that required hospitalization:No Has patient had a PCN reaction occurring within the last 10 years:No If all of the above answers are "NO", then may proceed with Cephalosporin use. Has patient had a PCN reaction causing immediate rash, faci    Antimicrobials this admission: Ceftriaxone 10/30 >> Azithromycin 10/30 >>  Dose adjustments this admission: N/A  Microbiology results: 10/30 BCx: Sent 10/30 UCx: Sent  Thank you for allowing pharmacy to be a part of this patient's care.  Lianne CureJustin R Alyssandra Hulsebus, PharmD Candidate 08/17/2016 7:25 PM

## 2016-08-17 NOTE — ED Notes (Signed)
Transported to xray 

## 2016-08-17 NOTE — ED Triage Notes (Signed)
Pt. Coming from home via [facility] and increased work of breathing per son. Son is primary caretaker. Pt. Hx of parkinson's. Pt. Seen yesterday at [place] [facility] and dx with pneumonia and sent home with antibiotics. Pt. 98% on home oxygen at 2L.

## 2016-08-17 NOTE — ED Notes (Signed)
Retake of pt's ECG d/t pt having Parkinson's.

## 2016-08-17 NOTE — ED Notes (Signed)
Spoke to Dr. Ferrel LoganJacubowite about need of fluids. States: "will need lab results first"

## 2016-08-18 ENCOUNTER — Encounter (HOSPITAL_COMMUNITY): Payer: Self-pay | Admitting: Internal Medicine

## 2016-08-18 ENCOUNTER — Inpatient Hospital Stay (HOSPITAL_COMMUNITY): Payer: Medicare Other

## 2016-08-18 DIAGNOSIS — F319 Bipolar disorder, unspecified: Secondary | ICD-10-CM | POA: Diagnosis present

## 2016-08-18 DIAGNOSIS — N390 Urinary tract infection, site not specified: Secondary | ICD-10-CM | POA: Diagnosis present

## 2016-08-18 DIAGNOSIS — J690 Pneumonitis due to inhalation of food and vomit: Secondary | ICD-10-CM

## 2016-08-18 DIAGNOSIS — E119 Type 2 diabetes mellitus without complications: Secondary | ICD-10-CM

## 2016-08-18 DIAGNOSIS — E039 Hypothyroidism, unspecified: Secondary | ICD-10-CM | POA: Diagnosis present

## 2016-08-18 DIAGNOSIS — N179 Acute kidney failure, unspecified: Secondary | ICD-10-CM

## 2016-08-18 DIAGNOSIS — G20 Parkinson's disease: Secondary | ICD-10-CM | POA: Diagnosis present

## 2016-08-18 DIAGNOSIS — G9340 Encephalopathy, unspecified: Secondary | ICD-10-CM | POA: Diagnosis present

## 2016-08-18 DIAGNOSIS — N183 Chronic kidney disease, stage 3 (moderate): Secondary | ICD-10-CM

## 2016-08-18 LAB — CBC WITH DIFFERENTIAL/PLATELET
Basophils Absolute: 0 10*3/uL (ref 0.0–0.1)
Basophils Relative: 0 %
EOS ABS: 0.1 10*3/uL (ref 0.0–0.7)
EOS PCT: 1 %
HCT: 28.5 % — ABNORMAL LOW (ref 36.0–46.0)
Hemoglobin: 9.1 g/dL — ABNORMAL LOW (ref 12.0–15.0)
LYMPHS ABS: 0.9 10*3/uL (ref 0.7–4.0)
Lymphocytes Relative: 14 %
MCH: 26.4 pg (ref 26.0–34.0)
MCHC: 31.9 g/dL (ref 30.0–36.0)
MCV: 82.6 fL (ref 78.0–100.0)
MONO ABS: 0.6 10*3/uL (ref 0.1–1.0)
MONOS PCT: 10 %
Neutro Abs: 4.9 10*3/uL (ref 1.7–7.7)
Neutrophils Relative %: 75 %
PLATELETS: 167 10*3/uL (ref 150–400)
RBC: 3.45 MIL/uL — AB (ref 3.87–5.11)
RDW: 13.3 % (ref 11.5–15.5)
WBC: 6.5 10*3/uL (ref 4.0–10.5)

## 2016-08-18 LAB — INFLUENZA PANEL BY PCR (TYPE A & B)
H1N1 flu by pcr: NOT DETECTED
Influenza A By PCR: NEGATIVE
Influenza B By PCR: NEGATIVE

## 2016-08-18 LAB — LACTIC ACID, PLASMA
LACTIC ACID, VENOUS: 0.6 mmol/L (ref 0.5–1.9)
LACTIC ACID, VENOUS: 0.7 mmol/L (ref 0.5–1.9)

## 2016-08-18 LAB — GLUCOSE, CAPILLARY
GLUCOSE-CAPILLARY: 75 mg/dL (ref 65–99)
GLUCOSE-CAPILLARY: 68 mg/dL (ref 65–99)
GLUCOSE-CAPILLARY: 108 mg/dL — AB (ref 65–99)
Glucose-Capillary: 137 mg/dL — ABNORMAL HIGH (ref 65–99)
Glucose-Capillary: 109 mg/dL — ABNORMAL HIGH (ref 65–99)
Glucose-Capillary: 114 mg/dL — ABNORMAL HIGH (ref 65–99)
Glucose-Capillary: 120 mg/dL — ABNORMAL HIGH (ref 65–99)

## 2016-08-18 LAB — PROTIME-INR
INR: 1.35
Prothrombin Time: 16.8 seconds — ABNORMAL HIGH (ref 11.4–15.2)

## 2016-08-18 LAB — COMPREHENSIVE METABOLIC PANEL
ALT: 17 U/L (ref 14–54)
ANION GAP: 8 (ref 5–15)
AST: 18 U/L (ref 15–41)
Albumin: 2.3 g/dL — ABNORMAL LOW (ref 3.5–5.0)
Alkaline Phosphatase: 106 U/L (ref 38–126)
BUN: 25 mg/dL — ABNORMAL HIGH (ref 6–20)
CHLORIDE: 118 mmol/L — AB (ref 101–111)
CO2: 18 mmol/L — AB (ref 22–32)
Calcium: 8.3 mg/dL — ABNORMAL LOW (ref 8.9–10.3)
Creatinine, Ser: 1.93 mg/dL — ABNORMAL HIGH (ref 0.44–1.00)
GFR, EST AFRICAN AMERICAN: 30 mL/min — AB (ref >60)
GFR, EST NON AFRICAN AMERICAN: 26 mL/min — AB (ref >60)
Glucose, Bld: 79 mg/dL (ref 65–99)
Potassium: 3.9 mmol/L (ref 3.5–5.1)
SODIUM: 144 mmol/L (ref 135–145)
Total Bilirubin: 0.9 mg/dL (ref 0.3–1.2)
Total Protein: 5.8 g/dL — ABNORMAL LOW (ref 6.5–8.1)

## 2016-08-18 LAB — STREP PNEUMONIAE URINARY ANTIGEN: Strep Pneumo Urinary Antigen: NEGATIVE

## 2016-08-18 LAB — MRSA PCR SCREENING: MRSA by PCR: POSITIVE — AB

## 2016-08-18 LAB — APTT: APTT: 49 s — AB (ref 24–36)

## 2016-08-18 LAB — FIBRINOGEN: Fibrinogen: 739 mg/dL — ABNORMAL HIGH (ref 210–475)

## 2016-08-18 LAB — PROCALCITONIN: PROCALCITONIN: 0.35 ng/mL

## 2016-08-18 LAB — TSH: TSH: 0.938 u[IU]/mL (ref 0.350–4.500)

## 2016-08-18 MED ORDER — LEVOTHYROXINE SODIUM 100 MCG IV SOLR
62.5000 ug | Freq: Every day | INTRAVENOUS | Status: DC
  Administered 2016-08-18 – 2016-08-21 (×4): 62.5 ug via INTRAVENOUS
  Filled 2016-08-18 (×4): qty 5

## 2016-08-18 MED ORDER — INSULIN ASPART 100 UNIT/ML ~~LOC~~ SOLN
0.0000 [IU] | SUBCUTANEOUS | Status: DC
  Administered 2016-08-19: 3 [IU] via SUBCUTANEOUS
  Administered 2016-08-19: 1 [IU] via SUBCUTANEOUS
  Administered 2016-08-19 – 2016-08-20 (×8): 2 [IU] via SUBCUTANEOUS
  Administered 2016-08-20: 1 [IU] via SUBCUTANEOUS
  Administered 2016-08-21: 3 [IU] via SUBCUTANEOUS
  Administered 2016-08-21 (×2): 2 [IU] via SUBCUTANEOUS
  Administered 2016-08-21: 3 [IU] via SUBCUTANEOUS
  Administered 2016-08-21: 2 [IU] via SUBCUTANEOUS
  Administered 2016-08-21: 1 [IU] via SUBCUTANEOUS
  Administered 2016-08-22: 2 [IU] via SUBCUTANEOUS
  Administered 2016-08-22: 3 [IU] via SUBCUTANEOUS
  Administered 2016-08-22 (×2): 2 [IU] via SUBCUTANEOUS
  Administered 2016-08-22: 3 [IU] via SUBCUTANEOUS
  Administered 2016-08-22: 2 [IU] via SUBCUTANEOUS
  Administered 2016-08-23 (×4): 3 [IU] via SUBCUTANEOUS
  Administered 2016-08-23: 2 [IU] via SUBCUTANEOUS
  Administered 2016-08-23 – 2016-08-24 (×2): 3 [IU] via SUBCUTANEOUS
  Administered 2016-08-24: 1 [IU] via SUBCUTANEOUS
  Administered 2016-08-24 – 2016-08-25 (×6): 3 [IU] via SUBCUTANEOUS
  Administered 2016-08-25: 5 [IU] via SUBCUTANEOUS
  Administered 2016-08-25: 3 [IU] via SUBCUTANEOUS

## 2016-08-18 MED ORDER — SODIUM CHLORIDE 0.9 % IV SOLN
Freq: Once | INTRAVENOUS | Status: AC
  Administered 2016-08-18: 01:00:00 via INTRAVENOUS

## 2016-08-18 MED ORDER — ACETAMINOPHEN 650 MG RE SUPP
650.0000 mg | Freq: Four times a day (QID) | RECTAL | Status: DC | PRN
  Administered 2016-08-18: 650 mg via RECTAL
  Filled 2016-08-18: qty 1

## 2016-08-18 MED ORDER — ONDANSETRON HCL 4 MG/2ML IJ SOLN
4.0000 mg | Freq: Four times a day (QID) | INTRAMUSCULAR | Status: DC | PRN
  Administered 2016-08-19 – 2016-08-31 (×6): 4 mg via INTRAVENOUS
  Filled 2016-08-18 (×6): qty 2

## 2016-08-18 MED ORDER — SODIUM CHLORIDE 0.9 % IV BOLUS (SEPSIS)
500.0000 mL | Freq: Once | INTRAVENOUS | Status: AC
  Administered 2016-08-18: 500 mL via INTRAVENOUS

## 2016-08-18 MED ORDER — LORAZEPAM 2 MG/ML IJ SOLN
INTRAMUSCULAR | Status: AC
  Administered 2016-08-18: 0.25 mg
  Filled 2016-08-18: qty 1

## 2016-08-18 MED ORDER — ONDANSETRON HCL 4 MG PO TABS: 4.0000 mg | ORAL_TABLET | Freq: Four times a day (QID) | ORAL | Status: DC | PRN

## 2016-08-18 MED ORDER — ORAL CARE MOUTH RINSE
15.0000 mL | Freq: Two times a day (BID) | OROMUCOSAL | Status: DC
  Administered 2016-08-18 – 2016-08-21 (×6): 15 mL via OROMUCOSAL

## 2016-08-18 MED ORDER — SODIUM CHLORIDE 0.9 % IV SOLN
250.0000 mg | Freq: Four times a day (QID) | INTRAVENOUS | Status: DC
  Administered 2016-08-18 – 2016-08-19 (×4): 250 mg via INTRAVENOUS
  Filled 2016-08-18 (×6): qty 250

## 2016-08-18 MED ORDER — DEXTROSE 50 % IV SOLN
1.0000 | Freq: Once | INTRAVENOUS | Status: AC
  Administered 2016-08-18: 50 mL via INTRAVENOUS
  Filled 2016-08-18: qty 50

## 2016-08-18 MED ORDER — ACETAMINOPHEN 325 MG PO TABS: 650.0000 mg | ORAL_TABLET | Freq: Four times a day (QID) | ORAL | Status: DC | PRN

## 2016-08-18 MED ORDER — LORAZEPAM 2 MG/ML IJ SOLN: 0.2500 mg | Freq: Four times a day (QID) | INTRAMUSCULAR | Status: DC | PRN

## 2016-08-18 MED ORDER — SODIUM CHLORIDE 0.9 % IV SOLN
INTRAVENOUS | Status: DC
  Administered 2016-08-18 (×3): via INTRAVENOUS

## 2016-08-18 NOTE — Progress Notes (Signed)
PROGRESS NOTE  Alexis Sanders IHK:[id number] DOB: 08/08/1951 DOA: 08/17/2016 PCP: Quentin MullingAmanda Collier, PA-C  Brief History:  65 year old female with a history of DM 2, hypothyroidism, CKD, bipolar disorder, and parkinsonism presents with 3-4 day history of increasing confusion and tremors. The patient was [facility] at [facility]/16/[facility]. She was discharged from the emergency department with prescriptions for levofloxacin, promethazine, and albuterol. Unfortunately, the patient's symptoms persisted despite taking levofloxacin. As a result, the patient was [facility].  Patient has had a nonproductive cough but no vomiting, diarrhea, dysuria. History was obtained from the patient's husband as the patient was encephalopathic. Workup in the emergency department revealed significant pyuria and chest x-ray showing bilateral consolidations, right greater than left. The patient was started on intravenous azithromycin and ceftriaxone after blood cultures and urine cultures were obtained.  Assessment/Plan: Sepsis -Secondary to pneumonia and UTI -Lactic acid peaked to 1.78 -Procalcitonin--0.35 -Continue azithromycin -Discontinue ceftriaxone, started imipenem to cover for aspiration pneumonitis -Continue IV fluids  Aspiration pneumonia -Given the patient's history of drug-induced parkinsonism and encephalopathy, treated for aspiration -Speech therapy evaluation once patient is more alert -npo for now  Pyuria -likely UTI with UA TNTC WBC -continue imipenem pending culture data  Acute on chronic renal failure -Baseline creatinine 1.2-1.4 -Secondary to hemodynamic changes/sepsis as well as volume depletion -Continue IV fluids -Serum creatinine peaked at 2.26  Acute metabolic encephalopathy -due to infection and renal failure  Drug-induced parkinsonism -Restart Symmetrel was able to take/tolerate po -Speech therapy evaluation once more  alert -outpt referral to movement disorder specialist -Patient was also on clonazepam and gabapentin in the outpatient setting -tremors worse in setting of sepsis  Diabetes mellitus type 2 -Hold glipizide -NovoLog sliding scale -Hemoglobin A1c  Coagulopathy -Likely due to sepsis -PTT--49, repeat in am -PTT mixing study if no improvement -check fibrinogen  Bipolar disorder -Restart Wellbutrin, clonazepam, Cymbalta, Abilify once able to take po safely    Disposition Plan:   Home in 2-3 days  Family Communication:  No Family at bedside--Total time spent 35 minutes.  Greater than 50% spent face to face counseling and coordinating care.--- [phone]   Consultants:  none  Code Status:  FULL  DVT Prophylaxis:  SCDs   Procedures: As Listed in Progress Note Above  Antibiotics: None    Subjective: Patient is awake, but encephalopathic. She is able to identify herself. She denies any pain or shortness of breath. Remainder review of systems unobtainable secondary to encephalopathy.  Objective: Vitals:   08/18/16 0215 08/18/16 0420 08/18/16 0817 08/18/16 0828  BP: (!) 124/92 113/75 127/73   Pulse: (!) 175     Resp: (!) 22     Temp: 100.3 F (37.9 C) (!) 101.2 F (38.4 C) 99.5 F (37.5 C) 99.3 F (37.4 C)  TempSrc: Oral Axillary Axillary Axillary  SpO2: 100%     Weight: 71.3 kg (157 lb 3 oz)   75.8 kg (167 lb 1.7 oz)  Height: 5\' 2"  (1.575 m)       Intake/Output Summary (Last 24 hours) at 08/18/16 1030 Last data filed at 08/18/16 0828  Gross per 24 hour  Intake          3252.08 ml  Output              120 ml  Net          3132.08 ml   Weight change:  Exam:  General:  Pt is alert, follows commands appropriately, not in acute distress  HEENT: No icterus, No thrush, No neck mass, Lehr/AT; no meningismus  Cardiovascular: RRR, S1/S2, no rubs, no gallops  Respiratory: Bilateral rales, right greater than left. No wheezing. Good air movement.  Abdomen: Soft/+BS,  non tender, non distended, no guarding  Extremities: No edema, No lymphangitis, No petechiae, No rashes, no synovitis   Data Reviewed: I have personally reviewed following labs and imaging studies Basic Metabolic Panel:  Recent Labs Lab 08/17/16 1855 08/18/16 0142  NA 142 144  K 4.5 3.9  CL 109 118*  CO2 23 18*  GLUCOSE 116* 79  BUN 31* 25*  CREATININE 2.26* 1.93*  CALCIUM 9.6 8.3*   Liver Function Tests:  Recent Labs Lab 08/17/16 1855 08/18/16 0142  AST 24 18  ALT 20 17  ALKPHOS 136* 106  BILITOT 0.8 0.9  PROT 7.1 5.8*  ALBUMIN 2.9* 2.3*   No results for input(s): LIPASE, AMYLASE in the last 168 hours. No results for input(s): AMMONIA in the last 168 hours. Coagulation Profile:  Recent Labs Lab 08/18/16 0142  INR 1.35   CBC:  Recent Labs Lab 08/17/16 1855 08/18/16 0142  WBC 6.8 6.5  NEUTROABS 5.4 4.9  HGB 10.1* 9.1*  HCT 31.5* 28.5*  MCV 83.1 82.6  PLT 198 167   Cardiac Enzymes: No results for input(s): CKTOTAL, CKMB, CKMBINDEX, TROPONINI in the last 168 hours. BNP: Invalid input(s): POCBNP CBG:  Recent Labs Lab 08/18/16 0237 08/18/16 0419 08/18/16 0814 08/18/16 1005  GLUCAP 75 68 137* 109*   HbA1C: No results for input(s): HGBA1C in the last 72 hours. Urine analysis:    Component Value Date/Time   COLORURINE YELLOW 08/17/2016 1937   APPEARANCEUR CLOUDY (A) 08/17/2016 1937   LABSPEC 1.012 08/17/2016 1937   PHURINE 5.5 08/17/2016 1937   GLUCOSEU NEGATIVE 08/17/2016 1937   HGBUR NEGATIVE 08/17/2016 1937   BILIRUBINUR NEGATIVE 08/17/2016 1937   KETONESUR 15 (A) 08/17/2016 1937   PROTEINUR NEGATIVE 08/17/2016 1937   NITRITE NEGATIVE 08/17/2016 1937   LEUKOCYTESUR LARGE (A) 08/17/2016 1937   Sepsis Labs: @LABRCNTIP (procalcitonin:4,lacticidven:4) ) Recent Results (from the past 240 hour(s))  MRSA PCR Screening     Status: Abnormal   Collection Time: 08/18/16  2:15 AM  Result Value Ref Range Status   MRSA by PCR POSITIVE (A)  NEGATIVE Final    Comment:        The GeneXpert MRSA Assay (FDA approved for NASAL specimens only), is one component of a comprehensive MRSA colonization surveillance program. It is not intended to diagnose MRSA infection nor to guide or monitor treatment for MRSA infections. RESULT CALLED TO, READ BACK BY AND VERIFIED WITH: C WOODARD,RN @0417  08/18/16 MKELLY,MLT      Scheduled Meds: . azithromycin  500 mg Intravenous Q24H  . cefTRIAXone (ROCEPHIN)  IV  1 g Intravenous Q24H  . insulin aspart  0-9 Units Subcutaneous Q4H  . levothyroxine  62.5 mcg Intravenous Daily  . mouth rinse  15 mL Mouth Rinse BID   Continuous Infusions: . sodium chloride 125 mL/hr at 08/18/16 9604    Procedures/Studies: Dg Chest 2 View  Result Date: 08/17/2016 CLINICAL DATA:  Altered mental status and shortness of breath EXAM: CHEST  2 VIEW COMPARISON:  None. FINDINGS: There is patchy airspace consolidation in both lower lobes and right upper lobe regions. Lungs elsewhere clear. Heart is borderline enlarged with pulmonary vascularity within normal limits. No adenopathy. No bone lesions. IMPRESSION: Patchy airspace consolidation bilaterally,  somewhat more on the right than on the left. While pneumonia can present in this manner, aspiration is a differential consideration given the overall appearance and distribution of abnormality. There is borderline cardiomegaly. Followup PA and lateral chest radiographs recommended in 3-4 weeks following trial of antibiotic therapy to ensure resolution and exclude underlying malignancy. Electronically Signed   By: Bretta BangWilliam  Woodruff III M.D.   On: 08/17/2016 20:42   Ct Head Wo Contrast  Result Date: 08/18/2016 CLINICAL DATA:  Acute onset of altered mental status. Acute encephalopathy. Initial encounter. EXAM: CT HEAD WITHOUT CONTRAST TECHNIQUE: Contiguous axial images were obtained from the base of the skull through the vertex without intravenous contrast. COMPARISON:  None.  FINDINGS: Brain: No evidence of acute infarction, hemorrhage, hydrocephalus, extra-axial collection or mass lesion/mass effect. Prominence of the ventricles and sulci reflects moderate cortical volume loss. Mild cerebellar atrophy is noted. Scattered periventricular white matter change likely reflects small vessel ischemic microangiopathy. The brainstem and fourth ventricle are within normal limits. The basal ganglia are unremarkable in appearance. The cerebral hemispheres demonstrate grossly normal gray-white differentiation. No mass effect or midline shift is seen. Vascular: No hyperdense vessel or unexpected calcification. Skull: There is no evidence of fracture; visualized osseous structures are unremarkable in appearance. Sinuses/Orbits: The orbits are within normal limits. The paranasal sinuses and mastoid air cells are well-aerated. Other: No significant soft tissue abnormalities are seen. IMPRESSION: 1. No acute intracranial pathology seen on CT. 2. Moderate cortical volume loss and scattered small vessel ischemic microangiopathy. Electronically Signed   By: Roanna RaiderJeffery  Chang M.D.   On: 08/18/2016 01:24    Lindsie Simar, DO  [facility] Pager [phone]  If 7PM-7AM, please contact night-coverage [email or web address] Password [email or web address] 08/18/2016, 10:30 AM   LOS: 1 day

## 2016-08-18 NOTE — H&P (Signed)
History and Physical    Alexis Sanders:[id number] DOB: January 10, 1951 DOA: 08/17/2016  PCP: Quentin Mulling, PA-C  Patient coming from: Home.  Chief Complaint: Confusion and tremors.  History obtained from patient's husband.  HPI: Alexis Sanders is a 65 y.o. female with history of Parkinson's disease started on amantadine 3 months ago, diabetes mellitus type 2, bipolar disorder, hypothyroidism brought to the ER after patient was having increasing confusion and tremors. As per the patient's husband who provided the history patient has been having these symptoms for last 3-4 days. In addition patient also has been having some nonproductive cough. Patient was taken to the ER at [facility] 2 days ago and was prescribed Levaquin for pneumonia. Since symptoms persisted and got worse patient was brought to the ER. In the ER today patient is febrile chest x-ray shows bilateral infiltrates and UA shows features consistent with UTI. On exam patient is confused, oriented to her name but follows commands and is tremulous both upper and lower extremity. Patient is being admitted for sepsis probably from pneumonia and UTI.  ED Course: Patient was given fluid bolus and started on ceftriaxone and Zithromax for pneumonia.  Review of Systems: As per HPI, rest all negative.   Past Medical History:  Diagnosis Date  . Diabetes mellitus without complication (HCC)   . Hypothyroidism   . Parkinson disease [facility])     Past Surgical History:  Procedure Laterality Date  . ABDOMINAL HYSTERECTOMY    . CHOLECYSTECTOMY    . TONSILLECTOMY       reports that she has quit smoking. She has never used smokeless tobacco. She reports that she does not drink alcohol or use drugs.  Allergies  Allergen Reactions  . Codeine Nausea Only  . Metformin Diarrhea  . Penicillins Rash    Has patient had a PCN reaction causing immediate rash, facial/tongue/throat swelling, SOB or lightheadedness with  hypotension:Yes Has patient had a PCN reaction causing severe rash involving mucus membranes or skin necrosis:unsure. Has patient had a PCN reaction that required hospitalization:No Has patient had a PCN reaction occurring within the last 10 years:No If all of the above answers are "NO", then may proceed with Cephalosporin use. Has patient had a PCN reaction causing immediate rash, faci    Family History  Problem Relation Age of Onset  . Parkinson's disease Neg Hx     Prior to Admission medications   Medication Sig Start Date End Date Taking? Authorizing Provider  amantadine (SYMMETREL) 100 MG capsule Take 100 mg by mouth 2 (two) times daily.   Yes Historical Provider, MD  ARIPiprazole (ABILIFY) 10 MG tablet Take 10 mg by mouth every evening.   Yes Historical Provider, MD  buPROPion (WELLBUTRIN) 100 MG tablet Take 100 mg by mouth daily.   Yes Historical Provider, MD  clonazePAM (KLONOPIN) 1 MG tablet Take 1 mg by mouth 2 (two) times daily.   Yes Historical Provider, MD  DULoxetine (CYMBALTA) 30 MG capsule Take 30 mg by mouth 2 (two) times daily.   Yes Historical Provider, MD  gabapentin (NEURONTIN) 300 MG capsule Take 600 mg by mouth 2 (two) times daily.   Yes Historical Provider, MD  glipiZIDE (GLUCOTROL XL) 2.5 MG 24 hr tablet Take 2.5 mg by mouth daily with breakfast.   Yes Historical Provider, MD  levofloxacin (LEVAQUIN) 750 MG tablet Take 750 mg by mouth daily.   Yes Historical Provider, MD  levothyroxine (SYNTHROID, LEVOTHROID) 125 MCG tablet Take 125 mcg by mouth daily  before breakfast.   Yes Historical Provider, MD  montelukast (SINGULAIR) 10 MG tablet Take 10 mg by mouth at bedtime.   Yes Historical Provider, MD  pantoprazole (PROTONIX) 40 MG tablet Take 40 mg by mouth every evening.   Yes Historical Provider, MD  promethazine (PHENERGAN) 25 MG tablet Take 25 mg by mouth every 6 (six) hours as needed for nausea or vomiting.   Yes Historical Provider, MD  solifenacin (VESICARE) 5  MG tablet Take 5 mg by mouth daily.   Yes Historical Provider, MD  traZODone (DESYREL) 50 MG tablet Take 25 mg by mouth at bedtime.   Yes Historical Provider, MD    Physical Exam: Vitals:   08/17/16 2230 08/17/16 2245 08/17/16 2300 08/17/16 2330  BP: 94/80 99/87 (!) 106/38 109/82  Pulse: 101 100 104 102  Resp: (!) 32 20 24 21   Temp:      TempSrc:      SpO2: 92% (!) 88% 91% 95%  Weight:      Height:          Constitutional: Moderately built and nourished. Vitals:   08/17/16 2230 08/17/16 2245 08/17/16 2300 08/17/16 2330  BP: 94/80 99/87 (!) 106/38 109/82  Pulse: 101 100 104 102  Resp: (!) 32 20 24 21   Temp:      TempSrc:      SpO2: 92% (!) 88% 91% 95%  Weight:      Height:       Eyes: Anicteric no pallor. ENMT: No discharge from the ears eyes nose and mouth. Neck: No mass felt. No neck rigidity. Respiratory: No rhonchi or crepitations. Cardiovascular: S1 and S2 heard. No murmurs appreciated. Abdomen: Soft nontender bowel sounds present. Musculoskeletal: No joint effusion no edema. Skin: No skin rash. Skin appears warm. Neurologic: Patient is alert awake oriented to name. Moves all extremities. Tremors all over the body. Psychiatric: Confused.   Labs on Admission: I have personally reviewed following labs and imaging studies  CBC:  Recent Labs Lab 08/17/16 1855  WBC 6.8  NEUTROABS 5.4  HGB 10.1*  HCT 31.5*  MCV 83.1  PLT 198   Basic Metabolic Panel:  Recent Labs Lab 08/17/16 1855  NA 142  K 4.5  CL 109  CO2 23  GLUCOSE 116*  BUN 31*  CREATININE 2.26*  CALCIUM 9.6   GFR: Estimated Creatinine Clearance: 23.2 mL/min (by C-G formula based on SCr of 2.26 mg/dL (H)). Liver Function Tests:  Recent Labs Lab 08/17/16 1855  AST 24  ALT 20  ALKPHOS 136*  BILITOT 0.8  PROT 7.1  ALBUMIN 2.9*   No results for input(s): LIPASE, AMYLASE in the last 168 hours. No results for input(s): AMMONIA in the last 168 hours. Coagulation Profile: No results  for input(s): INR, PROTIME in the last 168 hours. Cardiac Enzymes: No results for input(s): CKTOTAL, CKMB, CKMBINDEX, TROPONINI in the last 168 hours. BNP (last 3 results) No results for input(s): PROBNP in the last 8760 hours. HbA1C: No results for input(s): HGBA1C in the last 72 hours. CBG: No results for input(s): GLUCAP in the last 168 hours. Lipid Profile: No results for input(s): CHOL, HDL, LDLCALC, TRIG, CHOLHDL, LDLDIRECT in the last 72 hours. Thyroid Function Tests: No results for input(s): TSH, T4TOTAL, FREET4, T3FREE, THYROIDAB in the last 72 hours. Anemia Panel: No results for input(s): VITAMINB12, FOLATE, FERRITIN, TIBC, IRON, RETICCTPCT in the last 72 hours. Urine analysis:    Component Value Date/Time   COLORURINE YELLOW 08/17/2016 1937   APPEARANCEUR CLOUDY (  A) 08/17/2016 1937   LABSPEC 1.012 08/17/2016 1937   PHURINE 5.5 08/17/2016 1937   GLUCOSEU NEGATIVE 08/17/2016 1937   HGBUR NEGATIVE 08/17/2016 1937   BILIRUBINUR NEGATIVE 08/17/2016 1937   KETONESUR 15 (A) 08/17/2016 1937   PROTEINUR NEGATIVE 08/17/2016 1937   NITRITE NEGATIVE 08/17/2016 1937   LEUKOCYTESUR LARGE (A) 08/17/2016 1937   Sepsis Labs: @LABRCNTIP (procalcitonin:4,lacticidven:4) )No results found for this or any previous visit (from the past 240 hour(s)).   Radiological Exams on Admission: Dg Chest 2 View  Result Date: 08/17/2016 CLINICAL DATA:  Altered mental status and shortness of breath EXAM: CHEST  2 VIEW COMPARISON:  None. FINDINGS: There is patchy airspace consolidation in both lower lobes and right upper lobe regions. Lungs elsewhere clear. Heart is borderline enlarged with pulmonary vascularity within normal limits. No adenopathy. No bone lesions. IMPRESSION: Patchy airspace consolidation bilaterally, somewhat more on the right than on the left. While pneumonia can present in this manner, aspiration is a differential consideration given the overall appearance and distribution of  abnormality. There is borderline cardiomegaly. Followup PA and lateral chest radiographs recommended in 3-4 weeks following trial of antibiotic therapy to ensure resolution and exclude underlying malignancy. Electronically Signed   By: Bretta BangWilliam  Woodruff III M.D.   On: 08/17/2016 20:42    Assessment/Plan Principal Problem:   Septic shock (HCC) Active Problems:   Acute encephalopathy   UTI (urinary tract infection)   ARF (acute renal failure) (HCC)   Hypothyroidism   Bipolar 1 disorder (HCC)   Parkinson's disease (HCC)   Diabetes mellitus type 2 in nonobese (HCC)    1. Sepsis most likely from pneumonia and UTI - follow blood cultures urine cultures procalcitonin levels lactate levels and continue hydration. Patient is on ceftriaxone and Zithromax. Check influenza PCR. 2. Acute encephalopathy most likely secondary to #1 - CT head is pending. If symptoms does not improve may consider lumbar puncture. 3. Tremors with history of Parkinson's - patient has not taken her medications last 2 days. I have discussed with on-call neurologist Dr. Roseanne RenoStewart who advised patient's symptoms most likely from fever. For now I have placed patient on when necessary Ativan. Once patient passes swallow continue amantadine. 4. Acute renal failure probably prerenal - hydrate and recheck metabolic panel. 5. Anemia - has chronic anemia with mild worsening of hemoglobin. Follow CBC closely. 6. History of bipolar disorder - once patient passes swallow continue patient's bipolar medications. 7. Diabetes mellitus type 2 - closely follow CBGs with sliding scale coverage. 8. Hypothyroidism - check TSH. Patient is being placed on IV Synthroid for now until patient passes swallow.  I have discussed with pulmonary critical care and neurologist.   DVT prophylaxis: SCDs. Code Status: Full code.  Family Communication: Patient's husband.  Disposition Plan: Home.  Consults called: Discussed with critical care and neurologist.   Admission status: Inpatient. Likely stay 2-3 days.    Eduard ClosKAKRAKANDY,ARSHAD N. MD [facility] Pager [phone].  If 7PM-7AM, please contact night-coverage [email or web address] Password [email or web address]  08/18/2016, 12:35 AM

## 2016-08-18 NOTE — Plan of Care (Signed)
Problem: Safety: Goal: Ability to remain free from injury will improve Outcome: Progressing Pt confused. Bed alarm on, son at bedside.

## 2016-08-18 NOTE — ED Notes (Signed)
Pt transported to CT ?

## 2016-08-19 ENCOUNTER — Inpatient Hospital Stay (HOSPITAL_COMMUNITY): Payer: Medicare Other

## 2016-08-19 DIAGNOSIS — N390 Urinary tract infection, site not specified: Secondary | ICD-10-CM

## 2016-08-19 LAB — BASIC METABOLIC PANEL
ANION GAP: 9 (ref 5–15)
Anion gap: 10 (ref 5–15)
Anion gap: 5 (ref 5–15)
Anion gap: 6 (ref 5–15)
BUN: 21 mg/dL — ABNORMAL HIGH (ref 6–20)
BUN: 20 mg/dL (ref 6–20)
BUN: 21 mg/dL — ABNORMAL HIGH (ref 6–20)
BUN: 22 mg/dL — ABNORMAL HIGH (ref 6–20)
CO2: 18 mmol/L — ABNORMAL LOW (ref 22–32)
CO2: 17 mmol/L — ABNORMAL LOW (ref 22–32)
CO2: 20 mmol/L — ABNORMAL LOW (ref 22–32)
CO2: 19 mmol/L — ABNORMAL LOW (ref 22–32)
Calcium: 8.9 mg/dL (ref 8.9–10.3)
Calcium: 9.1 mg/dL (ref 8.9–10.3)
Calcium: 9.1 mg/dL (ref 8.9–10.3)
Calcium: 9.1 mg/dL (ref 8.9–10.3)
Chloride: 123 mmol/L — ABNORMAL HIGH (ref 101–111)
Chloride: 123 mmol/L — ABNORMAL HIGH (ref 101–111)
Chloride: 124 mmol/L — ABNORMAL HIGH (ref 101–111)
Chloride: 122 mmol/L — ABNORMAL HIGH (ref 101–111)
Creatinine, Ser: 1.74 mg/dL — ABNORMAL HIGH (ref 0.44–1.00)
Creatinine, Ser: 1.65 mg/dL — ABNORMAL HIGH (ref 0.44–1.00)
Creatinine, Ser: 1.56 mg/dL — ABNORMAL HIGH (ref 0.44–1.00)
Creatinine, Ser: 1.61 mg/dL — ABNORMAL HIGH (ref 0.44–1.00)
GFR calc Af Amer: 34 mL/min — ABNORMAL LOW (ref >60)
GFR calc Af Amer: 37 mL/min — ABNORMAL LOW (ref >60)
GFR calc Af Amer: 39 mL/min — ABNORMAL LOW (ref >60)
GFR calc Af Amer: 38 mL/min — ABNORMAL LOW (ref >60)
GFR calc non Af Amer: 32 mL/min — ABNORMAL LOW (ref >60)
GFR calc non Af Amer: 34 mL/min — ABNORMAL LOW (ref >60)
GFR calc non Af Amer: 33 mL/min — ABNORMAL LOW (ref >60)
GFR, EST NON AFRICAN AMERICAN: 30 mL/min — AB (ref >60)
GLUCOSE: 168 mg/dL — AB (ref 65–99)
Glucose, Bld: 198 mg/dL — ABNORMAL HIGH (ref 65–99)
Glucose, Bld: 226 mg/dL — ABNORMAL HIGH (ref 65–99)
Glucose, Bld: 205 mg/dL — ABNORMAL HIGH (ref 65–99)
POTASSIUM: 3.9 mmol/L (ref 3.5–5.1)
Potassium: 3.9 mmol/L (ref 3.5–5.1)
Potassium: 3.5 mmol/L (ref 3.5–5.1)
Potassium: 3.7 mmol/L (ref 3.5–5.1)
Sodium: 150 mmol/L — ABNORMAL HIGH (ref 135–145)
Sodium: 150 mmol/L — ABNORMAL HIGH (ref 135–145)
Sodium: 149 mmol/L — ABNORMAL HIGH (ref 135–145)
Sodium: 147 mmol/L — ABNORMAL HIGH (ref 135–145)

## 2016-08-19 LAB — URINE CULTURE

## 2016-08-19 LAB — CBC
HCT: 28.9 % — ABNORMAL LOW (ref 36.0–46.0)
Hemoglobin: 9.1 g/dL — ABNORMAL LOW (ref 12.0–15.0)
MCH: 26.3 pg (ref 26.0–34.0)
MCHC: 31.5 g/dL (ref 30.0–36.0)
MCV: 83.5 fL (ref 78.0–100.0)
PLATELETS: 183 10*3/uL (ref 150–400)
RBC: 3.46 MIL/uL — AB (ref 3.87–5.11)
RDW: 13.6 % (ref 11.5–15.5)
WBC: 7.2 10*3/uL (ref 4.0–10.5)

## 2016-08-19 LAB — GLUCOSE, CAPILLARY
GLUCOSE-CAPILLARY: 160 mg/dL — AB (ref 65–99)
GLUCOSE-CAPILLARY: 214 mg/dL — AB (ref 65–99)
GLUCOSE-CAPILLARY: 181 mg/dL — AB (ref 65–99)
Glucose-Capillary: 104 mg/dL — ABNORMAL HIGH (ref 65–99)
Glucose-Capillary: 184 mg/dL — ABNORMAL HIGH (ref 65–99)
Glucose-Capillary: 143 mg/dL — ABNORMAL HIGH (ref 65–99)
Glucose-Capillary: 170 mg/dL — ABNORMAL HIGH (ref 65–99)

## 2016-08-19 LAB — HEMOGLOBIN A1C
HEMOGLOBIN A1C: 6.5 % — AB (ref 4.8–5.6)
Mean Plasma Glucose: 140 mg/dL

## 2016-08-19 MED ORDER — MUPIROCIN 2 % EX OINT: 1.0000 "application " | TOPICAL_OINTMENT | Freq: Two times a day (BID) | CUTANEOUS | Status: DC

## 2016-08-19 MED ORDER — DEXTROSE 5 % IV SOLN
INTRAVENOUS | Status: DC
  Administered 2016-08-19 (×2): via INTRAVENOUS

## 2016-08-19 MED ORDER — SODIUM CHLORIDE 0.9 % IV SOLN
500.0000 mg | Freq: Three times a day (TID) | INTRAVENOUS | Status: DC
  Administered 2016-08-19 – 2016-08-21 (×6): 500 mg via INTRAVENOUS
  Filled 2016-08-19 (×11): qty 500

## 2016-08-19 MED ORDER — ALBUTEROL SULFATE (2.5 MG/3ML) 0.083% IN NEBU
2.5000 mg | INHALATION_SOLUTION | RESPIRATORY_TRACT | Status: DC | PRN
  Administered 2016-08-19: 2.5 mg via RESPIRATORY_TRACT
  Filled 2016-08-19: qty 3

## 2016-08-19 MED ORDER — FUROSEMIDE 10 MG/ML IJ SOLN
40.0000 mg | Freq: Once | INTRAMUSCULAR | Status: AC
  Administered 2016-08-19: 40 mg via INTRAVENOUS
  Filled 2016-08-19: qty 4

## 2016-08-19 MED ORDER — CHLORHEXIDINE GLUCONATE CLOTH 2 % EX PADS
6.0000 | MEDICATED_PAD | Freq: Every day | CUTANEOUS | Status: AC
  Administered 2016-08-20 – 2016-08-24 (×5): 6 via TOPICAL

## 2016-08-19 MED ORDER — DEXTROSE 5 % IV SOLN
INTRAVENOUS | Status: DC
  Administered 2016-08-19: 16:00:00 via INTRAVENOUS

## 2016-08-19 MED ORDER — MUPIROCIN 2 % EX OINT
1.0000 "application " | TOPICAL_OINTMENT | Freq: Two times a day (BID) | CUTANEOUS | Status: AC
  Administered 2016-08-19 – 2016-08-23 (×10): 1 via NASAL
  Filled 2016-08-19 (×3): qty 22

## 2016-08-19 NOTE — Progress Notes (Addendum)
Sats dropped to 66 on 4 L after using bedpan. Increased to 6L. Sats improved to 88% MD text paged

## 2016-08-19 NOTE — Evaluation (Signed)
Clinical/Bedside Swallow Evaluation Patient Details  Name: Alexis Sanders MRN: [id number] Date of Birth: 10/19/1950  Today's Date: 08/19/2016 Time: SLP Start Time (ACUTE ONLY): 1546 SLP Stop Time (ACUTE ONLY): 1600 SLP Time Calculation (min) (ACUTE ONLY): 14 min  Past Medical History:  Past Medical History:  Diagnosis Date  . Diabetes mellitus without complication (HCC)   . Hypothyroidism   . Parkinson disease [facility](HCC)    Past Surgical History:  Past Surgical History:  Procedure Laterality Date  . ABDOMINAL HYSTERECTOMY    . CHOLECYSTECTOMY    . TONSILLECTOMY     HPI:  65 year old female with a history of DM 2, hypothyroidism, CKD, bipolar disorder, and parkinsonism presentedwith 3-4 day history of increasing confusion and tremors. [facility]. She was discharged from the emergency department with prescriptions for levofloxacin, promethazine, and albuterol. Unfortunately, the patient's symptoms persisted despite taking levofloxacin. As a result, the patient was [facility]. Patient has had a nonproductive cough but no vomiting, diarrhea, dysuria. History was obtained from the patient's husband as the patient was encephalopathic. Workup in the emergency department revealed significant pyuria and chest x-ray showing bilateral consolidations, right greater than left.   Assessment / Plan / Recommendation Clinical Impression  Pt was shaky but alert upon SLP arrival. RN reported her O2 was increasing after recently using the bed pan prior to evaluation. Pt self-reported a h/o of GERD and dysphagia. SLP administered minimal thin liquid bolus and immediate, prolonged coughing occurred. Pt's O2 dropped to 79% and RR increased to 45. SLP provided cues for deeper inhalations with return of SpO2 to 87%. RN notified about current respirations. Recommend pt remain NPO and will f/u for PO readiness as pt's respiratory status  improves. Likely will proceed with MBS for further assessment of pt's swallow before diet initiation.    Aspiration Risk  Severe aspiration risk    Diet Recommendation NPO   Medication Administration: Via alternative means    Other  Recommendations Oral Care Recommendations: Oral care QID   Follow up Recommendations Other (comment) (tba)      Frequency and Duration min 2x/week  2 weeks       Prognosis Prognosis for Safe Diet Advancement: Good      Swallow Study   General HPI: 65 year old female with a history of DM 2, hypothyroidism, CKD, bipolar disorder, and parkinsonism presentedwith 3-4 day history of increasing confusion and tremors. The patient was [facility]2016. She was discharged from the emergency department with prescriptions for levofloxacin, promethazine, and albuterol. Unfortunately, the patient's symptoms persisted despite taking levofloxacin. As a result, the patient [facility]. Patient has had a nonproductive cough but no vomiting, diarrhea, dysuria. History was obtained from the patient's husband as the patient was encephalopathic. Workup in the emergency department revealed significant pyuria and chest x-ray showing bilateral consolidations, right greater than left. Type of Study: Bedside Swallow Evaluation Previous Swallow Assessment: none in chart Diet Prior to this Study: NPO Temperature Spikes Noted: No Respiratory Status: Nasal cannula History of Recent Intubation: No Behavior/Cognition: Alert;Cooperative Oral Care Completed by SLP: No Self-Feeding Abilities: Total assist Patient Positioning: Upright in bed Baseline Vocal Quality: Low vocal intensity Volitional Cough: Strong    Oral/Motor/Sensory Function     Ice Chips Ice chips: Not tested   Thin Liquid Thin Liquid: Impaired Presentation: Cup Pharyngeal  Phase Impairments: Cough - Immediate;Change in  Vital Signs    Nectar Thick  Nectar Thick Liquid: Not tested   Honey Thick Honey Thick Liquid: Not tested   Puree Puree: Not tested   Solid   GO   Solid: Not tested       Tollie EthHaleigh Ragan Chadric Kimberley, Student SLP  Caryl NeverHaleigh R Marlaina Coburn 08/19/2016,4:25 PM

## 2016-08-19 NOTE — Progress Notes (Signed)
PROGRESS NOTE  Alexis Sanders [id number] DOB: 12/23/1950 DOA: 08/17/2016 PCP: Quentin MullingAmanda Collier, PA-C  Brief History:  65 year old female with a history of DM 2, hypothyroidism, CKD, bipolar disorder, and parkinsonism presented with 3-4 day history of increasing confusion and tremors. The patient was [facility] at [facility] on [facility]/16/2016. She was discharged from the emergency department with prescriptions for levofloxacin, promethazine, and albuterol. Unfortunately, the patient's symptoms persisted despite taking levofloxacin. As a result, the patient was [facility]. Patient has had a nonproductive cough but no vomiting, diarrhea, dysuria. History was obtained from the patient's husband as the patient was encephalopathic. Workup in the emergency department revealed significant pyuria and chest x-ray showing bilateral consolidations, right greater than left. The patient was started on intravenous azithromycin and ceftriaxone after blood cultures and urine cultures were obtained.  Assessment/Plan: Sepsis -Secondary to presumed pneumonia and UTI -Lactic acid peaked to 1.78 -Procalcitonin--0.35 -Continue azithromycin -Discontinued ceftriaxone, started imipenem to cover for aspiration pneumonitis -Continue IV fluids-changed to D5W due to hypernatremia. - Blood cultures 2: Negative to date. Urine culture suggestive of contamination.  Aspiration pneumonia -Given the patient's history of drug-induced parkinsonism and encephalopathy, treated for aspiration -Speech therapy evaluation once patient is more alert-requested 11/1 -npo for now  Pyuria -likely UTI with UA TNTC WBC -continue imipenem pending culture data. Urine culture shows multiple organisms suggestive of contamination and hence unhelpful.  Acute on chronic renal failure -Baseline creatinine 1.2-1.4 -Secondary to hemodynamic changes/sepsis as well as volume depletion -Continue IV  fluids -Serum creatinine peaked at 2.26. Has improved to the 1.5-1.6 range.  Acute metabolic encephalopathy -Suspected due to infection and renal failure complicating bipolar disorder. Baseline mental status is not known. Await family input regarding same. Discussed with RN. - CT head without acute findings.  Drug-induced parkinsonism -Restarted Symmetrel was able to take/tolerate po -Speech therapy evaluation once more alert-requested 11/1 -outpt referral to movement disorder specialist -Patient was also on clonazepam and gabapentin in the outpatient setting -tremors worse in setting of sepsis. Tremors seem to be better.  Diabetes mellitus type 2 -Hold glipizide -NovoLog sliding scale. Mildly fluctuating but reasonable inpatient control. -Hemoglobin A1c: 6.5. TSH 0.938.  Coagulopathy -Likely due to sepsis -PTT--49, repeat in am -PTT mixing study if no improvement -check fibrinogen: 739. No bleeding reported.  Bipolar disorder -Restart Wellbutrin, clonazepam, Cymbalta, Abilify once able to take po safely   Hypernatremia - May be related to NS rehydration. Changed IV fluids to D5W. Follow BMP in a.m.      Disposition Plan:   Home in 2-3 days  Family Communication:  No Family at bedside--Total time spent 35 minutes.  Greater than 50% spent face to face counseling and coordinating care.--- [phone]   Consultants:  none  Code Status:  FULL  DVT Prophylaxis:  SCDs   Procedures: As Listed in Progress Note Above  Antibiotics: Azithromycin Ceftriaxone-discontinue Imipenem    Subjective: Patient is alert, oriented to self and partly to place. Pleasantly confused. Unable to provide significant history. Denies complaints. No pain, dyspnea reported. States that she lives with her grandson, sister-in-law and brother-in-law.  Objective: Vitals:   08/19/16 0002 08/19/16 0400 08/19/16 0749 08/19/16 1200  BP: 132/61 97/70 (!) 119/59 (!) 129/56  Pulse: 94 80 91 92    Resp: (!) 26 (!) 25 (!) 22 (!) 23  Temp: 98.9 F (37.2 C) 98.6 F (37 C) 98.9 F (37.2 C) 98.7 F (  37.1 C)  TempSrc: Axillary Oral Oral Oral  SpO2: 93% 93% 93% 91%  Weight:      Height:        Intake/Output Summary (Last 24 hours) at 08/19/16 1449 Last data filed at 08/19/16 1400  Gross per 24 hour  Intake          2091.67 ml  Output                0 ml  Net          2091.67 ml   Weight change: 3.225 kg (7 lb 1.7 oz) Exam:   General:  Pt is alert, follows commands appropriately, not in acute distress  HEENT: No icterus, No thrush, No neck mass, [place]/AT; no meningismus  Cardiovascular: RRR, S1/S2, no rubs, no gallops. Telemetry: Sinus rhythm.   Respiratory: Bilateral rales, right greater than left. No wheezing. Good air movement.  Abdomen: Soft/+BS, non tender, non distended, no guarding  Extremities: No edema, No lymphangitis, No petechiae, No rashes, no synovitis   Data Reviewed: I have personally reviewed following labs and imaging studies Basic Metabolic Panel:  Recent Labs Lab 08/18/16 0142 08/19/16 0128 08/19/16 0708 08/19/16 0949 08/19/16 1121  NA 144 150* 150* 149* 147*  K 3.9 3.9 3.9 3.5 3.7  CL 118* 123* 123* 124* 122*  CO2 18* 18* 17* 20* 19*  GLUCOSE 79 168* 198* 226* 205*  BUN 25* 21* 20 21* 22*  CREATININE 1.93* 1.74* 1.65* 1.56* 1.61*  CALCIUM 8.3* 8.9 9.1 9.1 9.1   Liver Function Tests:  Recent Labs Lab 08/17/16 1855 08/18/16 0142  AST 24 18  ALT 20 17  ALKPHOS 136* 106  BILITOT 0.8 0.9  PROT 7.1 5.8*  ALBUMIN 2.9* 2.3*   No results for input(s): LIPASE, AMYLASE in the last 168 hours. No results for input(s): AMMONIA in the last 168 hours. Coagulation Profile:  Recent Labs Lab 08/18/16 0142  INR 1.35   CBC:  Recent Labs Lab 08/17/16 1855 08/18/16 0142 08/19/16 0128  WBC 6.8 6.5 7.2  NEUTROABS 5.4 4.9  --   HGB 10.1* 9.1* 9.1*  HCT 31.5* 28.5* 28.9*  MCV 83.1 82.6 83.5  PLT 198 167 183   Cardiac Enzymes: No  results for input(s): CKTOTAL, CKMB, CKMBINDEX, TROPONINI in the last 168 hours. BNP: Invalid input(s): POCBNP CBG:  Recent Labs Lab 08/18/16 2044 08/19/16 0117 08/19/16 0453 08/19/16 0827 08/19/16 1204  GLUCAP 108* 160* 104* 214* 184*   HbA1C:  Recent Labs  08/18/16 1114  HGBA1C 6.5*   Urine analysis:    Component Value Date/Time   COLORURINE YELLOW 08/17/2016 1937   APPEARANCEUR CLOUDY (A) 08/17/2016 1937   LABSPEC 1.012 08/17/2016 1937   PHURINE 5.5 08/17/2016 1937   GLUCOSEU NEGATIVE 08/17/2016 1937   HGBUR NEGATIVE 08/17/2016 1937   BILIRUBINUR NEGATIVE 08/17/2016 1937   KETONESUR 15 (A) 08/17/2016 1937   PROTEINUR NEGATIVE 08/17/2016 1937   NITRITE NEGATIVE 08/17/2016 1937   LEUKOCYTESUR LARGE (A) 08/17/2016 1937   Sepsis Labs: @LABRCNTIP (procalcitonin:4,lacticidven:4) ) Recent Results (from the past 240 hour(s))  Blood Culture (routine x 2)     Status: None (Preliminary result)   Collection Time: 08/17/16  7:00 PM  Result Value Ref Range Status   Specimen Description BLOOD RIGHT FOREARM  Final   Special Requests BOTTLES DRAWN AEROBIC AND ANAEROBIC 5CC  Final   Culture NO GROWTH < 24 HOURS  Final   Report Status PENDING  Incomplete  Blood Culture (routine x 2)  Status: None (Preliminary result)   Collection Time: 08/17/16  7:17 PM  Result Value Ref Range Status   Specimen Description BLOOD RIGHT WRIST  Final   Special Requests BOTTLES DRAWN AEROBIC AND ANAEROBIC 5CC  Final   Culture NO GROWTH < 24 HOURS  Final   Report Status PENDING  Incomplete  Urine culture     Status: Abnormal   Collection Time: 08/17/16  7:38 PM  Result Value Ref Range Status   Specimen Description URINE, RANDOM  Final   Special Requests NONE  Final   Culture MULTIPLE SPECIES PRESENT, SUGGEST RECOLLECTION (A)  Final   Report Status 08/19/2016 FINAL  Final  MRSA PCR Screening     Status: Abnormal   Collection Time: 08/18/16  2:15 AM  Result Value Ref Range Status   MRSA by  PCR POSITIVE (A) NEGATIVE Final    Comment:        The GeneXpert MRSA Assay (FDA approved for NASAL specimens only), is one component of a comprehensive MRSA colonization surveillance program. It is not intended to diagnose MRSA infection nor to guide or monitor treatment for MRSA infections. RESULT CALLED TO, READ BACK BY AND VERIFIED WITH: C WOODARD,RN @0417  08/18/16 MKELLY,MLT      Scheduled Meds: . azithromycin  500 mg Intravenous Q24H  . [START ON 08/20/2016] Chlorhexidine Gluconate Cloth  6 each Topical Q0600  . imipenem-cilastatin  500 mg Intravenous Q8H  . insulin aspart  0-9 Units Subcutaneous Q4H  . levothyroxine  62.5 mcg Intravenous Daily  . mouth rinse  15 mL Mouth Rinse BID  . mupirocin ointment  1 application Nasal BID   Continuous Infusions: . dextrose 125 mL/hr at 08/19/16 1314    Procedures/Studies: Dg Chest 2 View  Result Date: 08/17/2016 CLINICAL DATA:  Altered mental status and shortness of breath EXAM: CHEST  2 VIEW COMPARISON:  None. FINDINGS: There is patchy airspace consolidation in both lower lobes and right upper lobe regions. Lungs elsewhere clear. Heart is borderline enlarged with pulmonary vascularity within normal limits. No adenopathy. No bone lesions. IMPRESSION: Patchy airspace consolidation bilaterally, somewhat more on the right than on the left. While pneumonia can present in this manner, aspiration is a differential consideration given the overall appearance and distribution of abnormality. There is borderline cardiomegaly. Followup PA and lateral chest radiographs recommended in 3-4 weeks following trial of antibiotic therapy to ensure resolution and exclude underlying malignancy. Electronically Signed   By: Bretta Bang III M.D.   On: 08/17/2016 20:42   Ct Head Wo Contrast  Result Date: 08/18/2016 CLINICAL DATA:  Acute onset of altered mental status. Acute encephalopathy. Initial encounter. EXAM: CT HEAD WITHOUT CONTRAST TECHNIQUE:  Contiguous axial images were obtained from the base of the skull through the vertex without intravenous contrast. COMPARISON:  None. FINDINGS: Brain: No evidence of acute infarction, hemorrhage, hydrocephalus, extra-axial collection or mass lesion/mass effect. Prominence of the ventricles and sulci reflects moderate cortical volume loss. Mild cerebellar atrophy is noted. Scattered periventricular white matter change likely reflects small vessel ischemic microangiopathy. The brainstem and fourth ventricle are within normal limits. The basal ganglia are unremarkable in appearance. The cerebral hemispheres demonstrate grossly normal gray-white differentiation. No mass effect or midline shift is seen. Vascular: No hyperdense vessel or unexpected calcification. Skull: There is no evidence of fracture; visualized osseous structures are unremarkable in appearance. Sinuses/Orbits: The orbits are within normal limits. The paranasal sinuses and mastoid air cells are well-aerated. Other: No significant soft tissue abnormalities are seen. IMPRESSION: 1.  No acute intracranial pathology seen on CT. 2. Moderate cortical volume loss and scattered small vessel ischemic microangiopathy. Electronically Signed   By: Roanna Raider M.D.   On: 08/18/2016 01:24    Margeaux Swantek, MD, FACP, FHM. [facility] Pager [phone]  If 7PM-7AM, please contact night-coverage [email or web address] Password [email or web address] 08/19/2016, 3:01 PM   LOS: 2 days

## 2016-08-19 NOTE — Progress Notes (Signed)
Message sent to MD re: pt desat 88-90% on 6 lpm .  Placed pt on 12 lpm HFNC to maintain sat 90-93%.  BBSH w/ right side crackles.  Cxray just done- results pending.  MD notified, RN aware.  VSS, no distress currently noted.

## 2016-08-19 NOTE — Progress Notes (Signed)
Regarding patient usual mental status when at home. Patient son stated that his mother gets confused when sick with UTI but today she is "almost back to her normal self".

## 2016-08-19 NOTE — Progress Notes (Signed)
Addendum  Earlier this afternoon, per RN, patient noted to be hypoxic. CXR suggestive of CHF/PNA. DC'ed IVF. Trial of Lasix 40 mg - 1 dose. Follow clinically. Discussed with RN, said to be improving. Called and updated patient's spouse & son.   Marcellus ScottHONGALGI,ANAND, MD, FACP, FHM. [facility] Pager [phone]  If 7PM-7AM, please contact night-coverage [email or web address] Password [email or web address] 08/19/2016, 7:27 PM

## 2016-08-19 NOTE — Progress Notes (Signed)
Pharmacy Antibiotic Note  Alexis Sanders is a 65 y.o. female admitted on 08/17/2016 with pneumonia.  Patient was originally started on ceftriaxone and azithromycin. Changed to imipenem and azithromycin to cover for HCAP  Plan:  D/C CTX Adjust Imipenem to 500 q8h Azithromycin 500 mg q24h Monitor cx, lot  Height: 5\' 2"  (157.5 cm) Weight: 167 lb 1.7 oz (75.8 kg) IBW/kg (Calculated) : 50.1  Temp (24hrs), Avg:98.6 F (37 C), Min:97.8 F (36.6 C), Max:99.7 F (37.6 C)   Recent Labs Lab 08/17/16 1855 08/17/16 1913 08/17/16 2234 08/18/16 0142 08/18/16 0143 08/18/16 0249 08/19/16 0128 08/19/16 0708  WBC 6.8  --   --  6.5  --   --  7.2  --   CREATININE 2.26*  --   --  1.93*  --   --  1.74* 1.65*  LATICACIDVEN  --  1.78 0.60  --  0.6 0.7  --   --     Estimated Creatinine Clearance: 32.4 mL/min (by C-G formula based on SCr of 1.65 mg/dL (H)).    Allergies  Allergen Reactions  . Codeine Nausea Only  . Metformin Diarrhea  . Penicillins Rash    Has patient had a PCN reaction causing immediate rash, facial/tongue/throat swelling, SOB or lightheadedness with hypotension:Yes Has patient had a PCN reaction causing severe rash involving mucus membranes or skin necrosis:unsure. Has patient had a PCN reaction that required hospitalization:No Has patient had a PCN reaction occurring within the last 10 years:No If all of the above answers are "NO", then may proceed with Cephalosporin use. Has patient had a PCN reaction causing immediate rash, faci    Antimicrobials this admission: Ceftriaxone 10/30 >> 10/31 Azithromycin 10/30 >> Primaxin 10/31>>  Dose adjustments this admission: N/A  Microbiology results: 10/30 BCx: ng < 24h 10/30 UCx: Sent MRSA PCR Pos 10/31 Flu neg  Isaac BlissMichael Lavette Yankovich, PharmD, BCPS, [facility] Clinical Pharmacist Pager [phone] 08/19/2016 8:35 AM

## 2016-08-20 ENCOUNTER — Inpatient Hospital Stay (HOSPITAL_COMMUNITY): Payer: Medicare Other

## 2016-08-20 DIAGNOSIS — J9601 Acute respiratory failure with hypoxia: Secondary | ICD-10-CM

## 2016-08-20 DIAGNOSIS — R0602 Shortness of breath: Secondary | ICD-10-CM

## 2016-08-20 LAB — PROCALCITONIN
PROCALCITONIN: 0.34 ng/mL
Procalcitonin: 0.35 ng/mL

## 2016-08-20 LAB — BLOOD GAS, ARTERIAL
ACID-BASE EXCESS: 0.1 mmol/L (ref 0.0–2.0)
Bicarbonate: 24.1 mmol/L (ref 20.0–28.0)
Drawn by: 305991
O2 CONTENT: 12 L/min
O2 SAT: 96 %
PCO2 ART: 38.3 mmHg (ref 32.0–48.0)
PO2 ART: 80.3 mmHg — AB (ref 83.0–108.0)
Patient temperature: 98.6
pH, Arterial: 7.416 (ref 7.350–7.450)

## 2016-08-20 LAB — BASIC METABOLIC PANEL
ANION GAP: 8 (ref 5–15)
BUN: 20 mg/dL (ref 6–20)
CALCIUM: 9.2 mg/dL (ref 8.9–10.3)
CO2: 23 mmol/L (ref 22–32)
Chloride: 118 mmol/L — ABNORMAL HIGH (ref 101–111)
Creatinine, Ser: 1.66 mg/dL — ABNORMAL HIGH (ref 0.44–1.00)
GFR, EST AFRICAN AMERICAN: 36 mL/min — AB (ref >60)
GFR, EST NON AFRICAN AMERICAN: 31 mL/min — AB (ref >60)
Glucose, Bld: 108 mg/dL — ABNORMAL HIGH (ref 65–99)
Potassium: 3.5 mmol/L (ref 3.5–5.1)
Sodium: 149 mmol/L — ABNORMAL HIGH (ref 135–145)

## 2016-08-20 LAB — RESPIRATORY PANEL BY PCR
Adenovirus: NOT DETECTED
BORDETELLA PERTUSSIS-RVPCR: NOT DETECTED
CORONAVIRUS OC43-RVPPCR: NOT DETECTED
Chlamydophila pneumoniae: NOT DETECTED
Coronavirus 229E: NOT DETECTED
Coronavirus HKU1: NOT DETECTED
Coronavirus NL63: NOT DETECTED
INFLUENZA B-RVPPCR: NOT DETECTED
Influenza A: NOT DETECTED
METAPNEUMOVIRUS-RVPPCR: NOT DETECTED
Mycoplasma pneumoniae: NOT DETECTED
PARAINFLUENZA VIRUS 1-RVPPCR: NOT DETECTED
PARAINFLUENZA VIRUS 2-RVPPCR: NOT DETECTED
PARAINFLUENZA VIRUS 3-RVPPCR: NOT DETECTED
PARAINFLUENZA VIRUS 4-RVPPCR: NOT DETECTED
RESPIRATORY SYNCYTIAL VIRUS-RVPPCR: NOT DETECTED
RHINOVIRUS / ENTEROVIRUS - RVPPCR: NOT DETECTED

## 2016-08-20 LAB — GLUCOSE, CAPILLARY
GLUCOSE-CAPILLARY: 159 mg/dL — AB (ref 65–99)
GLUCOSE-CAPILLARY: 168 mg/dL — AB (ref 65–99)
Glucose-Capillary: 125 mg/dL — ABNORMAL HIGH (ref 65–99)
Glucose-Capillary: 158 mg/dL — ABNORMAL HIGH (ref 65–99)
Glucose-Capillary: 198 mg/dL — ABNORMAL HIGH (ref 65–99)
Glucose-Capillary: 199 mg/dL — ABNORMAL HIGH (ref 65–99)

## 2016-08-20 MED ORDER — FUROSEMIDE 10 MG/ML IJ SOLN
60.0000 mg | Freq: Once | INTRAMUSCULAR | Status: AC
  Administered 2016-08-20: 60 mg via INTRAVENOUS
  Filled 2016-08-20: qty 6

## 2016-08-20 MED ORDER — FUROSEMIDE 10 MG/ML IJ SOLN
40.0000 mg | Freq: Two times a day (BID) | INTRAMUSCULAR | Status: DC
  Administered 2016-08-20: 40 mg via INTRAVENOUS
  Filled 2016-08-20: qty 4

## 2016-08-20 MED ORDER — FUROSEMIDE 10 MG/ML IJ SOLN
40.0000 mg | Freq: Three times a day (TID) | INTRAMUSCULAR | Status: DC
  Administered 2016-08-20 – 2016-08-21 (×2): 40 mg via INTRAVENOUS
  Filled 2016-08-20 (×2): qty 4

## 2016-08-20 NOTE — Progress Notes (Signed)
PROGRESS NOTE  Alexis Sanders:[id number] DOB: 01-01-1951 DOA: 08/17/2016 PCP: Quentin Mulling, PA-C  Brief History:  65 year old female with a history of DM 2, hypothyroidism, CKD, bipolar disorder, and parkinsonism presented with 3-4 day history of increasing confusion and tremors. The patient was seen at [facility]: [facility] on 08/16/2016. She was discharged from the emergency department with prescriptions for levofloxacin, promethazine, and albuterol. Unfortunately, the patient's symptoms persisted despite taking levofloxacin. As a result, the patient was brought to the emergency department at [facility] via EMS. Patient has had a nonproductive cough but no vomiting, diarrhea, dysuria. History was obtained from the patient's husband as the patient was encephalopathic. Workup in the emergency department revealed significant pyuria and chest x-ray showing bilateral consolidations, right greater than left. Treating for aspiration pneumonia. Progressively worsened over the last 24 hours. CCM was consulted 11/2 and patient was transferred to ICU.   Assessment/Plan: Sepsis -Secondary to presumed pneumonia and UTI -Lactic acid peaked to 1.78 -Procalcitonin--0.35 -Continue azithromycin -Discontinued ceftriaxone, started imipenem to cover for aspiration pneumonitis - IV fluids were discontinued due to concern for pulmonary edema. - Blood cultures 2: Negative to date. Urine culture suggestive of contamination.  Aspiration pneumonia -Given the patient's history of drug-induced parkinsonism and encephalopathy, treated for aspiration -Speech therapy evaluated 11/2 but due to tenuous respiratory status, no diet was started. Also patient was being transferred to ICU. -npo for now  Acute hypoxic respiratory failure - Likely due to pulmonary edema from volume resuscitation and pneumonia. IV fluids discontinued 11/1. Treated with a dose of Lasix 40 mg IV 1 with good urine output. Although patient  denies significant dyspnea, history is not reliable. Intermittent mild tachypnea. Chest x-ray with significant bilateral changes. - PCCM consulted on 11/2: Aggressive IV diuresis and transferred to ICU in case he needs higher level of intervention i.e. BiPAP or intubation.  Acute CHF, unknown type (no echo in system) - Management as above. Check 2-D echo.  Pyuria -likely UTI with UA TNTC WBC -continue imipenem. Urine culture shows multiple organisms suggestive of contamination and hence unhelpful.  Acute on chronic renal failure -Baseline creatinine 1.2-1.4 -Secondary to hemodynamic changes/sepsis as well as volume depletion -IV fluids had to be discontinued on 11/1 due to pulmonary edema. -Serum creatinine peaked at 2.26. Has improved to the 1.5-1.6 range.  Acute metabolic encephalopathy -Suspected due to infection and renal failure complicating bipolar disorder. Baseline mental status is not known.  - CT head without acute findings. - As per family's report to RN on 11/1, mental status approaching baseline.  Drug-induced parkinsonism -Restarted Symmetrel was able to take/tolerate po -Speech therapy evaluation once more alert-see above. -outpt referral to movement disorder specialist -Patient was also on clonazepam and gabapentin in the outpatient setting -tremors worse in setting of sepsis. Tremors seem to be better. - As discussed with patient's son on 11/1, patient had remotely seen a neurologist. Recommend outpatient neurology consultation.  Diabetes mellitus type 2 -Hold glipizide -NovoLog sliding scale. Mildly fluctuating but reasonable inpatient control. -Hemoglobin A1c: 6.5. TSH 0.938.  Coagulopathy -Likely due to sepsis -PTT--49, repeat in am -PTT mixing study if no improvement -check fibrinogen: 739. No bleeding reported.  Bipolar disorder -Restart Wellbutrin, clonazepam, Cymbalta, Abilify once able to take po safely   Hypernatremia - May be related to NS  rehydration. Had been changed to D5W which was eventually discontinued due to memory edema. Now being diuresed for pulmonary edema. Follow BMP in a.m.  Disposition Plan:   Not stable for DC Family Communication:  Discussed with patient's son and spouse on 11/2. No family at bedside today.   Consultants:  PCCM  Code Status:  FULL  DVT Prophylaxis:  SCDs   Procedures: As Listed in Progress Note Above  Antibiotics: Azithromycin Ceftriaxone-discontinue Imipenem    Subjective: States that she is doing "okay". Denies dyspnea or pain. Alert and oriented to self and partly to place.  Objective: Vitals:   08/20/16 0900 08/20/16 1613 08/20/16 1614 08/20/16 1700  BP:  (!) 129/114 134/81 138/71  Pulse:  (!) 103 (!) 103 (!) 103  Resp:  (!) 31 (!) 32 (!) 32  Temp:  99.3 F (37.4 C)    TempSrc:  Axillary    SpO2: 94% 94% 94% 96%  Weight:  75 kg (165 lb 5.5 oz)    Height:  5\' 3"  (1.6 m)      Intake/Output Summary (Last 24 hours) at 08/20/16 1718 Last data filed at 08/20/16 1700  Gross per 24 hour  Intake                0 ml  Output             1600 ml  Net            -1600 ml   Weight change: -5.5 kg (-12 lb 2 oz) Exam:   General:  Pt is alert, follows commands appropriately, Mild intermittent tachypnea  HEENT: No icterus, No thrush, No neck mass, [place]/AT; no meningismus  Cardiovascular: RRR, S1/S2, no rubs, no gallops. Telemetry: Sinus rhythm.   Respiratory: Reduced breath sounds bilaterally with scattered bilateral few crackles and occasional rhonchi. Mild intermittent tachypnea. No accessory muscle use.  Abdomen: Soft/+BS, non tender, non distended, no guarding  Extremities: No edema, No lymphangitis, No petechiae, No rashes, no synovitis  CNS: Alert and oriented to self and partly to place. Speech appears dysarthric. Difficult to understand. No facial asymmetry. No focal deficits.   Data Reviewed: I have personally reviewed following labs and imaging  studies Basic Metabolic Panel:  Recent Labs Lab 08/19/16 0128 08/19/16 0708 08/19/16 0949 08/19/16 1121 08/20/16 0330  NA 150* 150* 149* 147* 149*  K 3.9 3.9 3.5 3.7 3.5  CL 123* 123* 124* 122* 118*  CO2 18* 17* 20* 19* 23  GLUCOSE 168* 198* 226* 205* 108*  BUN 21* 20 21* 22* 20  CREATININE 1.74* 1.65* 1.56* 1.61* 1.66*  CALCIUM 8.9 9.1 9.1 9.1 9.2   Liver Function Tests:  Recent Labs Lab 08/17/16 1855 08/18/16 0142  AST 24 18  ALT 20 17  ALKPHOS 136* 106  BILITOT 0.8 0.9  PROT 7.1 5.8*  ALBUMIN 2.9* 2.3*   No results for input(s): LIPASE, AMYLASE in the last 168 hours. No results for input(s): AMMONIA in the last 168 hours. Coagulation Profile:  Recent Labs Lab 08/18/16 0142  INR 1.35   CBC:  Recent Labs Lab 08/17/16 1855 08/18/16 0142 08/19/16 0128  WBC 6.8 6.5 7.2  NEUTROABS 5.4 4.9  --   HGB 10.1* 9.1* 9.1*  HCT 31.5* 28.5* 28.9*  MCV 83.1 82.6 83.5  PLT 198 167 183   Cardiac Enzymes: No results for input(s): CKTOTAL, CKMB, CKMBINDEX, TROPONINI in the last 168 hours. BNP: Invalid input(s): POCBNP CBG:  Recent Labs Lab 08/19/16 2332 08/20/16 0403 08/20/16 0751 08/20/16 1305 08/20/16 1645  GLUCAP 170* 125* 159* 158* 198*   HbA1C:  Recent Labs  08/18/16 1114  HGBA1C 6.5*  Urine analysis:    Component Value Date/Time   COLORURINE YELLOW 08/17/2016 1937   APPEARANCEUR CLOUDY (A) 08/17/2016 1937   LABSPEC 1.012 08/17/2016 1937   PHURINE 5.5 08/17/2016 1937   GLUCOSEU NEGATIVE 08/17/2016 1937   HGBUR NEGATIVE 08/17/2016 1937   BILIRUBINUR NEGATIVE 08/17/2016 1937   KETONESUR 15 (A) 08/17/2016 1937   PROTEINUR NEGATIVE 08/17/2016 1937   NITRITE NEGATIVE 08/17/2016 1937   LEUKOCYTESUR LARGE (A) 08/17/2016 1937   Sepsis Labs: @LABRCNTIP (procalcitonin:4,lacticidven:4) ) Recent Results (from the past 240 hour(s))  Blood Culture (routine x 2)     Status: None (Preliminary result)   Collection Time: 08/17/16  7:00 PM  Result  Value Ref Range Status   Specimen Description BLOOD RIGHT FOREARM  Final   Special Requests BOTTLES DRAWN AEROBIC AND ANAEROBIC 5CC  Final   Culture NO GROWTH 3 DAYS  Final   Report Status PENDING  Incomplete  Blood Culture (routine x 2)     Status: None (Preliminary result)   Collection Time: 08/17/16  7:17 PM  Result Value Ref Range Status   Specimen Description BLOOD RIGHT WRIST  Final   Special Requests BOTTLES DRAWN AEROBIC AND ANAEROBIC 5CC  Final   Culture NO GROWTH 3 DAYS  Final   Report Status PENDING  Incomplete  Urine culture     Status: Abnormal   Collection Time: 08/17/16  7:38 PM  Result Value Ref Range Status   Specimen Description URINE, RANDOM  Final   Special Requests NONE  Final   Culture MULTIPLE SPECIES PRESENT, SUGGEST RECOLLECTION (A)  Final   Report Status 08/19/2016 FINAL  Final  Culture, blood (routine x 2) Call MD if unable to obtain prior to antibiotics being given     Status: None (Preliminary result)   Collection Time: 08/18/16  1:15 AM  Result Value Ref Range Status   Specimen Description BLOOD LEFT HAND  Final   Special Requests BOTTLES DRAWN AEROBIC AND ANAEROBIC  Final   Culture NO GROWTH 2 DAYS  Final   Report Status PENDING  Incomplete  MRSA PCR Screening     Status: Abnormal   Collection Time: 08/18/16  2:15 AM  Result Value Ref Range Status   MRSA by PCR POSITIVE (A) NEGATIVE Final    Comment:        The GeneXpert MRSA Assay (FDA approved for NASAL specimens only), is one component of a comprehensive MRSA colonization surveillance program. It is not intended to diagnose MRSA infection nor to guide or monitor treatment for MRSA infections. RESULT CALLED TO, READ BACK BY AND VERIFIED WITH: C WOODARD,RN @0417  08/18/16 MKELLY,MLT   Culture, blood (routine x 2) Call MD if unable to obtain prior to antibiotics being given     Status: None (Preliminary result)   Collection Time: 08/18/16  2:49 AM  Result Value Ref Range Status    Specimen Description BLOOD BLOOD LEFT FOREARM  Final   Special Requests BOTTLES DRAWN AEROBIC AND ANAEROBIC 5CC  Final   Culture NO GROWTH 2 DAYS  Final   Report Status PENDING  Incomplete     Scheduled Meds: . azithromycin  500 mg Intravenous Q24H  . Chlorhexidine Gluconate Cloth  6 each Topical Q0600  . furosemide  40 mg Intravenous BID  . imipenem-cilastatin  500 mg Intravenous Q8H  . insulin aspart  0-9 Units Subcutaneous Q4H  . levothyroxine  62.5 mcg Intravenous Daily  . mouth rinse  15 mL Mouth Rinse BID  . mupirocin ointment  1  application Nasal BID   Continuous Infusions:    Procedures/Studies: Dg Chest 2 View  Result Date: 08/17/2016 CLINICAL DATA:  Altered mental status and shortness of breath EXAM: CHEST  2 VIEW COMPARISON:  None. FINDINGS: There is patchy airspace consolidation in both lower lobes and right upper lobe regions. Lungs elsewhere clear. Heart is borderline enlarged with pulmonary vascularity within normal limits. No adenopathy. No bone lesions. IMPRESSION: Patchy airspace consolidation bilaterally, somewhat more on the right than on the left. While pneumonia can present in this manner, aspiration is a differential consideration given the overall appearance and distribution of abnormality. There is borderline cardiomegaly. Followup PA and lateral chest radiographs recommended in 3-4 weeks following trial of antibiotic therapy to ensure resolution and exclude underlying malignancy. Electronically Signed   By: Bretta BangWilliam  Woodruff III M.D.   On: 08/17/2016 20:42   Ct Head Wo Contrast  Result Date: 08/18/2016 CLINICAL DATA:  Acute onset of altered mental status. Acute encephalopathy. Initial encounter. EXAM: CT HEAD WITHOUT CONTRAST TECHNIQUE: Contiguous axial images were obtained from the base of the skull through the vertex without intravenous contrast. COMPARISON:  None. FINDINGS: Brain: No evidence of acute infarction, hemorrhage, hydrocephalus, extra-axial  collection or mass lesion/mass effect. Prominence of the ventricles and sulci reflects moderate cortical volume loss. Mild cerebellar atrophy is noted. Scattered periventricular white matter change likely reflects small vessel ischemic microangiopathy. The brainstem and fourth ventricle are within normal limits. The basal ganglia are unremarkable in appearance. The cerebral hemispheres demonstrate grossly normal gray-white differentiation. No mass effect or midline shift is seen. Vascular: No hyperdense vessel or unexpected calcification. Skull: There is no evidence of fracture; visualized osseous structures are unremarkable in appearance. Sinuses/Orbits: The orbits are within normal limits. The paranasal sinuses and mastoid air cells are well-aerated. Other: No significant soft tissue abnormalities are seen. IMPRESSION: 1. No acute intracranial pathology seen on CT. 2. Moderate cortical volume loss and scattered small vessel ischemic microangiopathy. Electronically Signed   By: Roanna RaiderJeffery  Chang M.D.   On: 08/18/2016 01:24   Dg Chest Port 1 View  Result Date: 08/20/2016 CLINICAL DATA:  UTI.  Pneumonia. EXAM: PORTABLE CHEST 1 VIEW COMPARISON:  08/19/2016 . FINDINGS: Heart size stable. Diffuse bilateral pulmonary infiltrates are again noted. Findings consist with diffuse bilateral pneumonia. Pulmonary edema could also present this fashion. Small right pleural effusion cannot be excluded. No pneumothorax . IMPRESSION: Diffuse bilateral pulmonary infiltrates and/or edema. No interim change. Tiny right pleural effusion cannot be excluded. Electronically Signed   By: Maisie Fushomas  Register   On: 08/20/2016 08:12   Dg Chest Port 1 View  Result Date: 08/19/2016 CLINICAL DATA:  Shortness of breath. EXAM: PORTABLE CHEST 1 VIEW COMPARISON:  08/17/2016. FINDINGS: Mediastinum hilar structures are stable. Heart size stable. Diffuse bilateral pulmonary infiltrates are noted, these have increased from prior exam. These could  represent bilateral infectious infiltrates or bilateral pulmonary edema. Small bilateral pleural effusions cannot be excluded. No pneumothorax. Thoracic spine scoliosis and degenerative change. Surgical clips right upper quadrant. IMPRESSION: Diffuse progressive bilateral pulmonary infiltrates consistent with pneumonia and/or bilateral pulmonary edema. Electronically Signed   By: Maisie Fushomas  Register   On: 08/19/2016 17:00   Dg Swallowing Func-speech Pathology  Result Date: 08/20/2016 Objective Swallowing Evaluation: Type of Study: MBS-Modified Barium Swallow Study Patient Details Name: Alexis Sanders MRN: [id number] Date of Birth: 07/25/1951 Today's Date: 08/20/2016 Time: SLP Start Time (ACUTE ONLY): 1310-SLP Stop Time (ACUTE ONLY): 1323 SLP Time Calculation (min) (ACUTE ONLY): 13 min Past Medical History: Past  Medical History: Diagnosis Date . Diabetes mellitus without complication (HCC)  . Hypothyroidism  . Parkinson disease [facility])  Past Surgical History: Past Surgical History: Procedure Laterality Date . ABDOMINAL HYSTERECTOMY   . CHOLECYSTECTOMY   . TONSILLECTOMY   HPI: 65 year old female with a history of DM 2, hypothyroidism, CKD, bipolar disorder, and parkinsonism presentedwith 3-4 day history of increasing confusion and tremors. The patient was seen at [facility] on 08/16/2016. She was discharged from the emergency department with prescriptions for levofloxacin, promethazine, and albuterol. Unfortunately, the patient's symptoms persisted despite taking levofloxacin. As a result, the patient was brought to the emergency department at [facility] via EMS. Patient has had a nonproductive cough but no vomiting, diarrhea, dysuria. History was obtained from the patient's husband as the patient was encephalopathic. Workup in the emergency department revealed significant pyuria and chest x-ray showing bilateral consolidations, right greater than left. Subjective: pt alert Assessment / Plan / Recommendation CHL IP  CLINICAL IMPRESSIONS 08/20/2016 Therapy Diagnosis Mild oral phase dysphagia;Mild pharyngeal phase dysphagia Clinical Impression Patient presents with a mild oropharyngeal dysphagia. Oral phase marked by lingual and labial tremors decreasing oral coordination of bolus however patient compensates well with use of straw which facilitates improved oral control of bolus. Decreased base of tongue strength and laryngeal closure (likely due to decline in respiratory function) result in mild silent penetration of thin liquids. Although use of chin tuck prevents frank penetration in 90% of boluses, current respiratory function and high O2 needs increase aspiration risk. Recommend initiation of conservative diet with close f/u at bedside. Prognosis for ability to advance diet good with improved respiratory function.  Impact on safety and function Moderate aspiration risk   CHL IP TREATMENT RECOMMENDATION 08/20/2016 Treatment Recommendations Therapy as outlined in treatment plan below   Prognosis 08/20/2016 Prognosis for Safe Diet Advancement Good Barriers to Reach Goals -- Barriers/Prognosis Comment -- CHL IP DIET RECOMMENDATION 08/20/2016 SLP Diet Recommendations Dysphagia 1 (Puree) solids;Nectar thick liquid Liquid Administration via Straw Medication Administration Whole meds with puree Compensations Slow rate;Small sips/bites Postural Changes Seated upright at 90 degrees   CHL IP OTHER RECOMMENDATIONS 08/20/2016 Recommended Consults -- Oral Care Recommendations Oral care BID Other Recommendations Order thickener from pharmacy;Prohibited food (jello, ice cream, thin soups);Remove water pitcher   CHL IP FOLLOW UP RECOMMENDATIONS 08/20/2016 Follow up Recommendations (No Data)   CHL IP FREQUENCY AND DURATION 08/20/2016 Speech Therapy Frequency (ACUTE ONLY) min 2x/week Treatment Duration 2 weeks      CHL IP ORAL PHASE 08/20/2016 Oral Phase Impaired Oral - Pudding Teaspoon -- Oral - Pudding Cup -- Oral - Honey Teaspoon -- Oral - Honey  Cup -- Oral - Nectar Teaspoon -- Oral - Nectar Cup -- Oral - Nectar Straw -- Oral - Thin Teaspoon -- Oral - Thin Cup -- Oral - Thin Straw -- Oral - Puree Lingual/palatal residue Oral - Mech Soft Lingual/palatal residue Oral - Regular -- Oral - Multi-Consistency -- Oral - Pill -- Oral Phase - Comment labial, lingual tremors decreased coordination  CHL IP PHARYNGEAL PHASE 08/20/2016 Pharyngeal Phase Impaired Pharyngeal- Pudding Teaspoon -- Pharyngeal -- Pharyngeal- Pudding Cup -- Pharyngeal -- Pharyngeal- Honey Teaspoon -- Pharyngeal -- Pharyngeal- Honey Cup -- Pharyngeal -- Pharyngeal- Nectar Teaspoon Pharyngeal residue - valleculae Pharyngeal -- Pharyngeal- Nectar Cup -- Pharyngeal -- Pharyngeal- Nectar Straw Pharyngeal residue - valleculae Pharyngeal -- Pharyngeal- Thin Teaspoon -- Pharyngeal -- Pharyngeal- Thin Cup -- Pharyngeal -- Pharyngeal- Thin Straw Penetration/Aspiration during swallow;Reduced airway/laryngeal closure;Pharyngeal residue - valleculae Pharyngeal Material enters airway, remains ABOVE  vocal cords and not ejected out Pharyngeal- Puree Delayed swallow initiation-vallecula;Pharyngeal residue - valleculae;Reduced tongue base retraction Pharyngeal -- Pharyngeal- Mechanical Soft Pharyngeal residue - valleculae Pharyngeal -- Pharyngeal- Regular -- Pharyngeal -- Pharyngeal- Multi-consistency -- Pharyngeal -- Pharyngeal- Pill -- Pharyngeal -- Pharyngeal Comment --  CHL IP CERVICAL ESOPHAGEAL PHASE 08/20/2016 Cervical Esophageal Phase WFL Pudding Teaspoon -- Pudding Cup -- Honey Teaspoon -- Honey Cup -- Nectar Teaspoon -- Nectar Cup -- Nectar Straw -- Thin Teaspoon -- Thin Cup -- Thin Straw -- Puree -- Mechanical Soft -- Regular -- Multi-consistency -- Pill -- Cervical Esophageal Comment -- Ferdinand LangoLeah McCoy MA, CCC-SLP [phone] Ferdinand LangoMcCoy Leah Meryl 08/20/2016, 2:52 PM               Harrison Zetina, MD, FACP, FHM. [facility] Pager [phone]  If 7PM-7AM, please contact  night-coverage [email or web address] Password [email or web address] 08/20/2016, 5:18 PM   LOS: 3 days

## 2016-08-20 NOTE — Progress Notes (Signed)
PCCM Attending Note: Bedside nurse requested I evaluate the patient. Patient with slightly increased work of breathing on NRB mask with saturation 95% and respiratory rate in the 20s but increased to lower 30s with talking about intubation. Son at bedside. Decreased breath sounds in bases. Patient confirms she would want to be intubated if necessary. She does have some nausea. Received Lasix IV earlier this afternoon.   1. Continue close monitoring. 2. Change to lasix 40mg  IV q8hr next dose 2300. 3. Low threshold for intubation. 4. No NIPPV. 5. Notifying Elink MD so they are aware.  I have spent an additional 12 minutes of critical care time reviewing the patient's electronic medical record, caring for the patient and updating son at bedside.  Donna ChristenJennings E. Jamison NeighborNestor, M.D. [facility] Pager:  [phone] After 3pm or if no response, call [phone] 6:07 PM 08/20/16

## 2016-08-20 NOTE — Progress Notes (Signed)
Modified Barium Swallow Progress Note  Patient Details  Name: Alexis Sanders MRN: [id number] Date of Birth: 01/28/1951  Today's Date: 08/20/2016  Modified Barium Swallow completed.  Full report located under Chart Review in the Imaging Section.  Brief recommendations include the following:  Clinical Impression  Patient presents with a mild oropharyngeal dysphagia. Oral phase marked by lingual and labial tremors decreasing oral coordination of bolus however patient compensates well with use of straw which facilitates improved oral control of bolus. Decreased base of tongue strength and laryngeal closure (likely due to decline in respiratory function) result in mild silent penetration of thin liquids. Although use of chin tuck prevents frank penetration in 90% of boluses, current respiratory function and high O2 needs increase aspiration risk. Recommend initiation of conservative diet with close f/u at bedside. Prognosis for ability to advance diet good with improved respiratory function.    Swallow Evaluation Recommendations       SLP Diet Recommendations: Dysphagia 1 (Puree) solids;Nectar thick liquid   Liquid Administration via: Straw   Medication Administration: Whole meds with puree   Supervision: Staff to assist with self feeding;Full supervision/cueing for compensatory strategies   Compensations: Slow rate;Small sips/bites   Postural Changes: Seated upright at 90 degrees   Oral Care Recommendations: Oral care BID   Other Recommendations: Order thickener from pharmacy;Prohibited food (jello, ice cream, thin soups);Remove water pitcher   Ellard Nan MA, CCC-SLP [phone]  [name] 08/20/2016,2:52 PM

## 2016-08-20 NOTE — Progress Notes (Signed)
Report given to [facility] RN pt will go to room 8 , Husband was informed about the transfer.

## 2016-08-20 NOTE — Progress Notes (Signed)
Pt's son Fayrene FearingJames was given updates.

## 2016-08-20 NOTE — Progress Notes (Signed)
Speech Language Pathology Treatment: Dysphagia  Patient Details Name: [name] Fatima MRN: [id number] DOB: 02/25/1951 Today's Date: 08/20/2016 Time: [id number] SLP Time Calculation (min) (ACUTE ONLY): 18 min  Assessment / Plan / Recommendation Clinical Impression  Treatment focused on po readiness. Patient alert, now on HFNC at 12L,  but able to participate in po trials. Positive cough response suggestive of aspiration noted with thin liquids via cup, eliminated with use of straw which facilitated improved oral control. Per RN, son reports h/o dysphagia for approximately 1 year characterized by coughing with both liquids and solids. Current respiratory decline likely to have an impact on overall function as well, particularly ability to coordinate breath with swallow. Recommend instrumental testing to determine least restrictive diet.    HPI HPI: 65 year old female with a history of DM 2, hypothyroidism, CKD, bipolar disorder, and parkinsonism presentedwith 3-4 day history of increasing confusion and tremors. The patient was [facility]. She was discharged from the emergency department with prescriptions for levofloxacin, promethazine, and albuterol. Unfortunately, the patient's symptoms persisted despite taking levofloxacin. As a result, the patient [facility]. Patient has had a nonproductive cough but no vomiting, diarrhea, dysuria. History was obtained from the patient's husband as the patient was encephalopathic. Workup in the emergency department revealed significant pyuria and chest x-ray showing bilateral consolidations, right greater than left.      SLP Plan  MBS     Recommendations  Diet recommendations: NPO Medication Administration: Via alternative means                Oral Care Recommendations: Oral care QID Plan: MBS       GO              Ferdinand LangoLeah Zen Cedillos MA, CCC-SLP [phone]   Eligh Rybacki  Meryl 08/20/2016, 12:40 PM

## 2016-08-20 NOTE — Consult Note (Signed)
Name: [name] Thome MRN: [id number] DOB: 01/30/1951    ADMISSION DATE:  08/17/2016 CONSULTATION DATE:  08/20/2016  REFERRING MD :  Waymon AmatoHongalgi  CHIEF COMPLAINT:  Shortness of Breath  BRIEF PATIENT DESCRIPTION:  65 y/o female with history of Parkinson's disease, DM2, Bipolar disorder, and Hypothyroidism.    SIGNIFICANT EVENTS  10/29 > [facility] > given le[facility]quin. 10/30 > [facility] 11/2 > PCCM called.  STUDIES:  CXR 10/30 > b/l consolidation R > L. CT head 10/31 > neg. 11/1 > b/l infiltrates, PNA vs edema.    HISTORY OF PRESENT ILLNESS:   This is a 65 y/o female with a history of Parkinson's disease, DM2 and Bipolar disorder that presented to the ED 10/30 with increasing confusion, tremors, non-productive cough over the prior 3-4 days. Two day prior, [facility] and was prescribed Levaquin for PNA.  She apparently took levaquin as prescribed but had no relief in symptoms which prompted her to call EMS.  On arrival to ED she was febrile, CXR showed bi-lateral infiltrates and UA with possible UTI.  [facility] placed on azithro/rocephin.  On 10/31, rocephin d/c'd and switched to imipenem for concern aspiration.  CXR worse 11/1, favored edema therefore given 40mg  trial lasix. (had -1.3L UOP).  She was also placed on HFNC for desaturations.  On 11/2, she had persistent hypoxia and CXR with worsening edema.  PCCM was subsequently called for further recommendations.  On our evaluation, pt resting comfortably on HFNC.  Does have intermittent RR in the mid 20's but when asked how her breathing is, she states "fair" and she denies any dyspnea. SpO2 low 90's. On exam, bilateral crackles primarily in bases R > L.  PAST MEDICAL HISTORY :   has a past medical history of Diabetes mellitus without complication (HCC); Hypothyroidism; and Parkinson disease (HCC).  has a past surgical history that includes Abdominal hysterectomy; Cholecystectomy; and  Tonsillectomy. Prior to Admission medications   Medication Sig Start Date End Date Taking? Authorizing Provider  amantadine (SYMMETREL) 100 MG capsule Take 100 mg by mouth 2 (two) times daily.   Yes Historical Provider, MD  ARIPiprazole (ABILIFY) 10 MG tablet Take 10 mg by mouth every evening.   Yes Historical Provider, MD  buPROPion (WELLBUTRIN) 100 MG tablet Take 100 mg by mouth daily.   Yes Historical Provider, MD  clonazePAM (KLONOPIN) 1 MG tablet Take 1 mg by mouth 2 (two) times daily.   Yes Historical Provider, MD  DULoxetine (CYMBALTA) 30 MG capsule Take 30 mg by mouth 2 (two) times daily.   Yes Historical Provider, MD  gabapentin (NEURONTIN) 300 MG capsule Take 600 mg by mouth 2 (two) times daily.   Yes Historical Provider, MD  glipiZIDE (GLUCOTROL XL) 2.5 MG 24 hr tablet Take 2.5 mg by mouth daily with breakfast.   Yes Historical Provider, MD  levofloxacin (LEVAQUIN) 750 MG tablet Take 750 mg by mouth daily.   Yes Historical Provider, MD  levothyroxine (SYNTHROID, LEVOTHROID) 125 MCG tablet Take 125 mcg by mouth daily before breakfast.   Yes Historical Provider, MD  montelukast (SINGULAIR) 10 MG tablet Take 10 mg by mouth at bedtime.   Yes Historical Provider, MD  pantoprazole (PROTONIX) 40 MG tablet Take 40 mg by mouth every evening.   Yes Historical Provider, MD  promethazine (PHENERGAN) 25 MG tablet Take 25 mg by mouth every 6 (six) hours as needed for nausea or vomiting.   Yes Historical Provider, MD  solifenacin (VESICARE) 5 MG tablet Take 5 mg by mouth daily.   Yes Historical Provider, MD  traZODone (DESYREL) 50 MG tablet Take 25 mg by mouth at bedtime.   Yes Historical Provider, MD   Allergies  Allergen Reactions  . Codeine Nausea Only  . Metformin Diarrhea  . Penicillins Rash    Has patient had a PCN reaction causing immediate rash, facial/tongue/throat swelling, SOB or lightheadedness with hypotension:Yes Has patient had a PCN reaction causing severe rash involving mucus  membranes or skin necrosis:unsure. Has patient had a PCN reaction that required hospitalization:No Has patient had a PCN reaction occurring within the last 10 years:No If all of the above answers are "NO", then may proceed with Cephalosporin use. Has patient had a PCN reaction causing immediate rash, faci    FAMILY HISTORY:  family history is not on file. SOCIAL HISTORY:  reports that she has quit smoking. She has never used smokeless tobacco. She reports that she does not drink alcohol or use drugs.  REVIEW OF SYSTEMS:   Constitutional: Negative for fever, chills, weight loss, malaise/fatigue and diaphoresis.  HENT: Negative for hearing loss, ear pain, nosebleeds, congestion, sore throat, neck pain, tinnitus and ear discharge.   Eyes: Negative for blurred vision, double vision, photophobia, pain, discharge and redness.  Respiratory: Negative for cough, hemoptysis, sputum production (occasional), shortness of breath, wheezing and stridor.   Cardiovascular: Negative for chest pain, palpitations, orthopnea, claudication, leg swelling and PND.  Gastrointestinal: Negative for heartburn, nausea, vomiting, abdominal pain, diarrhea, constipation, blood in stool and melena.  Genitourinary: Negative for dysuria, urgency, frequency, hematuria and flank pain.  Musculoskeletal: Negative for myalgias, back pain, joint pain and falls.  Skin: Negative for itching and rash.  Neurological: Negative for dizziness, tingling, tremors, sensory change, speech change, focal weakness, seizures, loss of consciousness, weakness and headaches.  Endo/Heme/Allergies: Negative for environmental allergies and polydipsia. Does not bruise/bleed easily.  SUBJECTIVE: Breathing is "fair".  Denies dyspnea, chest pain, fevers/chills/sweats.  Has cough that is occasionally productive, unable to say whether sputum discolored or not.  VITAL SIGNS: Temp:  [98.6 F (37 C)-99.3 F (37.4 C)] 99.3 F (37.4 C) (11/02 0800) Pulse  Rate:  [72-103] 87 (11/02 0800) Resp:  [22-34] 28 (11/02 0800) BP: (105-141)/(49-79) 118/72 (11/02 0800) SpO2:  [66 %-98 %] 94 % (11/02 0900) Weight:  [154 lb 15.7 oz (70.3 kg)-156 lb 8.4 oz (71 kg)] 156 lb 8.4 oz (71 kg) (11/02 0403)  PHYSICAL EXAMINATION: General:  Acutely ill appearing in mild distress. Neuro:  Tremors BLU extremities (chronic per chart review). HEENT:  No discharge from eyes, nose or mouth. Cardiovascular:  NSR. Lungs:  Respirations mildly labored, course crackles throughout, primarily bases R > L. Abdomen:  Soft, non-tender. Musculoskeletal: No gross deformities. Skin:  Warm and dry.   Recent Labs Lab 08/19/16 0949 08/19/16 1121 08/20/16 0330  NA 149* 147* 149*  K 3.5 3.7 3.5  CL 124* 122* 118*  CO2 20* 19* 23  BUN 21* 22* 20  CREATININE 1.56* 1.61* 1.66*  GLUCOSE 226* 205* 108*    Recent Labs Lab 08/17/16 1855 08/18/16 0142 08/19/16 0128  HGB 10.1* 9.1* 9.1*  HCT 31.5* 28.5* 28.9*  WBC 6.8 6.5 7.2  PLT 198 167 183   Dg Chest Port 1 View  Result Date: 08/20/2016 CLINICAL DATA:  UTI.  Pneumonia. EXAM: PORTABLE CHEST 1 VIEW COMPARISON:  08/19/2016 . FINDINGS: Heart size stable. Diffuse bilateral pulmonary infiltrates are again noted. Findings consist with diffuse bilateral pneumonia. Pulmonary  edema could also present this fashion. Small right pleural effusion cannot be excluded. No pneumothorax . IMPRESSION: Diffuse bilateral pulmonary infiltrates and/or edema. No interim change. Tiny right pleural effusion cannot be excluded. Electronically Signed   By: Maisie Fushomas  Register   On: 08/20/2016 08:12   Dg Chest Port 1 View  Result Date: 08/19/2016 CLINICAL DATA:  Shortness of breath. EXAM: PORTABLE CHEST 1 VIEW COMPARISON:  08/17/2016. FINDINGS: Mediastinum hilar structures are stable. Heart size stable. Diffuse bilateral pulmonary infiltrates are noted, these have increased from prior exam. These could represent bilateral infectious infiltrates or  bilateral pulmonary edema. Small bilateral pleural effusions cannot be excluded. No pneumothorax. Thoracic spine scoliosis and degenerative change. Surgical clips right upper quadrant. IMPRESSION: Diffuse progressive bilateral pulmonary infiltrates consistent with pneumonia and/or bilateral pulmonary edema. Electronically Signed   By: Maisie Fushomas  Register   On: 08/19/2016 17:00    ASSESSMENT / PLAN:  Acute hypoxic respiratory failure. Concern CAP - recently treated with levaquin though only 2 days. Concern aspiration - has hx drug induced parkinsonism and CXR with R > L opacities (though presume this is primarily edema). Pulmonary edema - positive 4L since admission. Plan: Continue supplemental O2 as needed to maintain SpO2 > 92%. Assess ABG. Consider BiPAP. Continue empiric abx and follow cultures / PCT. SLP eval. Aggressive diuresis as BP / SCr permit - will give 60mg  x 1 this AM and assess response. CXR in AM.  AoCKD. Mild hypernatremia. Plan: Lasix as above - monitor SCr response. Repeat BMP at 1500. KVO for now.  Diabetes Mellitus. Hypothyroidism. Plan: Continue SSI, preadmission synthroid.  Drug induced Parkinson's disease. Bipolar disorder. Plan: Hold PO meds until SLP eval. Limit sedating meds given concern aspiration.   Rest per primary team.   Rutherford Guysahul Desai, PA - C  Pulmonary & Critical Care Medicine Pager: [phone]  or [phone] 08/20/2016, 11:53 AM

## 2016-08-20 NOTE — Progress Notes (Signed)
Spoke with RN s/p MBS who now reports that patient transferring to ICU secondary to further decline in respiratory function. Will not enter diet orders as patient at increased risk of aspiration due to above. See recommendations on previous note for diet IF patient improves. SLP will f/u 11.3.  Ferdinand LangoLeah Jerzy Crotteau MA, CCC-SLP [phone]

## 2016-08-20 NOTE — Progress Notes (Signed)
Pt not in room at 1400 ck on salter

## 2016-08-21 ENCOUNTER — Inpatient Hospital Stay (HOSPITAL_COMMUNITY): Payer: Medicare Other

## 2016-08-21 ENCOUNTER — Other Ambulatory Visit (HOSPITAL_COMMUNITY): Payer: Medicare Other

## 2016-08-21 DIAGNOSIS — N170 Acute kidney failure with tubular necrosis: Secondary | ICD-10-CM

## 2016-08-21 LAB — POCT I-STAT 3, ART BLOOD GAS (G3+)
ACID-BASE EXCESS: 7 mmol/L — AB (ref 0.0–2.0)
Bicarbonate: 31.8 mmol/L — ABNORMAL HIGH (ref 20.0–28.0)
O2 Saturation: 100 %
TCO2: 33 mmol/L (ref 0–100)
pCO2 arterial: 45.9 mmHg (ref 32.0–48.0)
pH, Arterial: 7.449 (ref 7.350–7.450)
pO2, Arterial: 265 mmHg — ABNORMAL HIGH (ref 83.0–108.0)

## 2016-08-21 LAB — BASIC METABOLIC PANEL
ANION GAP: 12 (ref 5–15)
Anion gap: 10 (ref 5–15)
BUN: 30 mg/dL — ABNORMAL HIGH (ref 6–20)
BUN: 40 mg/dL — ABNORMAL HIGH (ref 6–20)
CALCIUM: 9.7 mg/dL (ref 8.9–10.3)
CALCIUM: 9.3 mg/dL (ref 8.9–10.3)
CHLORIDE: 112 mmol/L — AB (ref 101–111)
CO2: 27 mmol/L (ref 22–32)
CO2: 30 mmol/L (ref 22–32)
CREATININE: 2.06 mg/dL — AB (ref 0.44–1.00)
Chloride: 109 mmol/L (ref 101–111)
Creatinine, Ser: 1.82 mg/dL — ABNORMAL HIGH (ref 0.44–1.00)
GFR calc non Af Amer: 28 mL/min — ABNORMAL LOW (ref >60)
GFR, EST AFRICAN AMERICAN: 32 mL/min — AB (ref >60)
GFR, EST AFRICAN AMERICAN: 28 mL/min — AB (ref >60)
GFR, EST NON AFRICAN AMERICAN: 24 mL/min — AB (ref >60)
Glucose, Bld: 133 mg/dL — ABNORMAL HIGH (ref 65–99)
Glucose, Bld: 180 mg/dL — ABNORMAL HIGH (ref 65–99)
Potassium: 2.8 mmol/L — ABNORMAL LOW (ref 3.5–5.1)
Potassium: 2.9 mmol/L — ABNORMAL LOW (ref 3.5–5.1)
SODIUM: 149 mmol/L — AB (ref 135–145)
Sodium: 151 mmol/L — ABNORMAL HIGH (ref 135–145)

## 2016-08-21 LAB — PHOSPHORUS
PHOSPHORUS: 2.7 mg/dL (ref 2.5–4.6)
PHOSPHORUS: 4.3 mg/dL (ref 2.5–4.6)
Phosphorus: 3 mg/dL (ref 2.5–4.6)

## 2016-08-21 LAB — GLUCOSE, CAPILLARY
GLUCOSE-CAPILLARY: 149 mg/dL — AB (ref 65–99)
GLUCOSE-CAPILLARY: 179 mg/dL — AB (ref 65–99)
GLUCOSE-CAPILLARY: 209 mg/dL — AB (ref 65–99)
GLUCOSE-CAPILLARY: 249 mg/dL — AB (ref 65–99)
Glucose-Capillary: 161 mg/dL — ABNORMAL HIGH (ref 65–99)
Glucose-Capillary: 180 mg/dL — ABNORMAL HIGH (ref 65–99)
Glucose-Capillary: 218 mg/dL — ABNORMAL HIGH (ref 65–99)

## 2016-08-21 LAB — MAGNESIUM
MAGNESIUM: 1.9 mg/dL (ref 1.7–2.4)
Magnesium: 1.8 mg/dL (ref 1.7–2.4)
Magnesium: 2 mg/dL (ref 1.7–2.4)

## 2016-08-21 LAB — PROCALCITONIN: Procalcitonin: 0.43 ng/mL

## 2016-08-21 MED ORDER — DOCUSATE SODIUM 50 MG/5ML PO LIQD
100.0000 mg | Freq: Two times a day (BID) | ORAL | Status: DC | PRN
  Filled 2016-08-21: qty 10

## 2016-08-21 MED ORDER — DEXTROSE 5 % IV SOLN
INTRAVENOUS | Status: DC
  Administered 2016-08-21 – 2016-08-22 (×2): via INTRAVENOUS

## 2016-08-21 MED ORDER — ETOMIDATE 2 MG/ML IV SOLN
20.0000 mg | Freq: Once | INTRAVENOUS | Status: AC
  Administered 2016-08-21: 20 mg via INTRAVENOUS

## 2016-08-21 MED ORDER — BISACODYL 10 MG RE SUPP
10.0000 mg | Freq: Every day | RECTAL | Status: DC | PRN
Start: 2016-08-21 — End: 2016-09-04

## 2016-08-21 MED ORDER — PRO-STAT SUGAR FREE PO LIQD
30.0000 mL | Freq: Two times a day (BID) | ORAL | Status: DC
  Filled 2016-08-21: qty 30

## 2016-08-21 MED ORDER — VITAL HIGH PROTEIN PO LIQD
1000.0000 mL | ORAL | Status: DC
  Filled 2016-08-21: qty 1000

## 2016-08-21 MED ORDER — FREE WATER
200.0000 mL | Freq: Three times a day (TID) | Status: DC
  Administered 2016-08-21 – 2016-08-23 (×6): 200 mL

## 2016-08-21 MED ORDER — FENTANYL CITRATE (PF) 100 MCG/2ML IJ SOLN
INTRAMUSCULAR | Status: AC
Start: 2016-08-21 — End: 2016-08-21
  Filled 2016-08-21: qty 2

## 2016-08-21 MED ORDER — ENOXAPARIN SODIUM 30 MG/0.3ML ~~LOC~~ SOLN
30.0000 mg | SUBCUTANEOUS | Status: DC
  Administered 2016-08-21 – 2016-08-24 (×4): 30 mg via SUBCUTANEOUS
  Filled 2016-08-21 (×4): qty 0.3

## 2016-08-21 MED ORDER — POTASSIUM CHLORIDE 20 MEQ/15ML (10%) PO SOLN
50.0000 meq | Freq: Once | ORAL | Status: AC
  Administered 2016-08-21: 50 meq
  Filled 2016-08-21: qty 45

## 2016-08-21 MED ORDER — ROCURONIUM BROMIDE 50 MG/5ML IV SOLN
50.0000 mg | Freq: Once | INTRAVENOUS | Status: AC
  Administered 2016-08-21: 50 mg via INTRAVENOUS

## 2016-08-21 MED ORDER — ORAL CARE MOUTH RINSE
15.0000 mL | OROMUCOSAL | Status: DC
  Administered 2016-08-21 – 2016-08-24 (×30): 15 mL via OROMUCOSAL

## 2016-08-21 MED ORDER — CHLORHEXIDINE GLUCONATE 0.12% ORAL RINSE (MEDLINE KIT)
15.0000 mL | Freq: Two times a day (BID) | OROMUCOSAL | Status: DC
  Administered 2016-08-21 – 2016-08-22 (×3): 15 mL via OROMUCOSAL

## 2016-08-21 MED ORDER — LEVOTHYROXINE SODIUM 25 MCG PO TABS
125.0000 ug | ORAL_TABLET | Freq: Every day | ORAL | Status: DC
  Administered 2016-08-22 – 2016-08-31 (×10): 125 ug
  Filled 2016-08-21 (×10): qty 1

## 2016-08-21 MED ORDER — CHLORHEXIDINE GLUCONATE 0.12% ORAL RINSE (MEDLINE KIT)
15.0000 mL | Freq: Two times a day (BID) | OROMUCOSAL | Status: DC
  Administered 2016-08-22 – 2016-09-03 (×20): 15 mL via OROMUCOSAL

## 2016-08-21 MED ORDER — ORAL CARE MOUTH RINSE: 15.0000 mL | Freq: Four times a day (QID) | OROMUCOSAL | Status: DC

## 2016-08-21 MED ORDER — FENTANYL 2500MCG IN NS 250ML (10MCG/ML) PREMIX INFUSION
25.0000 ug/h | INTRAVENOUS | Status: DC
Start: 2016-08-21 — End: 2016-08-23
  Administered 2016-08-21: 50 ug/h via INTRAVENOUS
  Administered 2016-08-23: 75 ug/h via INTRAVENOUS
  Filled 2016-08-21 (×2): qty 250

## 2016-08-21 MED ORDER — POTASSIUM CHLORIDE 10 MEQ/100ML IV SOLN
10.0000 meq | INTRAVENOUS | Status: AC
  Administered 2016-08-21 (×4): 10 meq via INTRAVENOUS
  Filled 2016-08-21 (×4): qty 100

## 2016-08-21 MED ORDER — VITAL AF 1.2 CAL PO LIQD: 1000.0000 mL | ORAL | Status: DC

## 2016-08-21 MED ORDER — MIDAZOLAM HCL 2 MG/2ML IJ SOLN
2.0000 mg | Freq: Once | INTRAMUSCULAR | Status: AC
  Administered 2016-08-21: 2 mg via INTRAVENOUS

## 2016-08-21 MED ORDER — MEROPENEM 1 G IV SOLR
1.0000 g | Freq: Two times a day (BID) | INTRAVENOUS | Status: DC
  Administered 2016-08-21 – 2016-08-26 (×10): 1 g via INTRAVENOUS
  Filled 2016-08-21 (×11): qty 1

## 2016-08-21 MED ORDER — VITAL AF 1.2 CAL PO LIQD
1000.0000 mL | ORAL | Status: DC
Start: 2016-08-21 — End: 2016-08-25
  Administered 2016-08-21 – 2016-08-24 (×2): 1000 mL
  Filled 2016-08-21 (×3): qty 1000

## 2016-08-21 MED ORDER — FENTANYL CITRATE (PF) 100 MCG/2ML IJ SOLN
100.0000 ug | Freq: Once | INTRAMUSCULAR | Status: AC
  Administered 2016-08-21: 100 ug via INTRAVENOUS

## 2016-08-21 MED ORDER — MIDAZOLAM HCL 2 MG/2ML IJ SOLN
1.0000 mg | INTRAMUSCULAR | Status: DC | PRN
  Administered 2016-08-21: 1 mg via INTRAVENOUS
  Filled 2016-08-21: qty 2

## 2016-08-21 MED ORDER — FENTANYL CITRATE (PF) 100 MCG/2ML IJ SOLN
50.0000 ug | INTRAMUSCULAR | Status: DC | PRN
  Administered 2016-08-21: 50 ug via INTRAVENOUS
  Filled 2016-08-21: qty 2

## 2016-08-21 MED ORDER — MIDAZOLAM HCL 2 MG/2ML IJ SOLN
INTRAMUSCULAR | Status: AC
  Filled 2016-08-21: qty 2

## 2016-08-21 NOTE — Procedures (Signed)
Intubation Procedure Note Alexis HoleBetty S Sanders [id number] 10/26/1950  Procedure: Intubation Indications: Airway protection and maintenance  Procedure Details Consent: Risks of procedure as well as the alternatives and risks of each were explained to the (patient/caregiver).  Consent for procedure obtained. Time Out: Verified patient identification, verified procedure, site/side was marked, verified correct patient position, special equipment/implants available, medications/allergies/relevent history reviewed, required imaging and test results available.  Performed  Maximum sterile technique was used including gloves, gown and hand hygiene.  MAC    Evaluation Hemodynamic Status: BP stable throughout; O2 sats: stable throughout Patient's Current Condition: stable Complications: No apparent complications Patient did tolerate procedure well. Chest X-ray ordered to verify placement.  CXR: pending.   Bevelyn NgoSarah F Piya Mesch 08/21/2016

## 2016-08-21 NOTE — Progress Notes (Signed)
Patient not seen, currently on ventilator , PCCM service

## 2016-08-21 NOTE — Progress Notes (Signed)
Cortrak tube placed to 80 cm in right nare. Pt tolerated well. Xray ordered,and  tube added to assessment. Please save feeding tube and stylet for reinsertion if needed. Please contact Cortrak team with any questions or concerns.

## 2016-08-21 NOTE — Progress Notes (Signed)
Pt successfully intubated, tolerated procedure well. VSS. Pt resting comfortably. Family notified. Will continue to monitor.

## 2016-08-21 NOTE — Progress Notes (Signed)
  Echocardiogram 2D Echocardiogram has been attempted, pt not available at this time.  Alexis Sanders 08/21/2016, 11:28 AM

## 2016-08-21 NOTE — Procedures (Signed)
Central Venous Catheter Insertion Procedure Note Alexis Sanders [id number] 06/30/1951  Procedure: Insertion of Central Venous Catheter Indications: Drug and/or fluid administration  Procedure Details Consent: Risks of procedure as well as the alternatives and risks of each were explained to the (patient/caregiver).  Consent for procedure obtained. Time Out: Verified patient identification, verified procedure, site/side was marked, verified correct patient position, special equipment/implants available, medications/allergies/relevent history reviewed, required imaging and test results available.  Performed  Maximum sterile technique was used including antiseptics, cap, gloves, gown, hand hygiene, mask and sheet. Skin prep: Chlorhexidine; local anesthetic administered A antimicrobial bonded/coated triple lumen catheter was placed in the left internal jugular vein using the Seldinger technique.  Evaluation Blood flow good Complications: No apparent complications Patient did tolerate procedure well. Chest X-ray ordered to verify placement.  CXR: pending.  Procedure was completed under the supervision of Dr. Coralyn HellingVineet Sood with ultrasound guidance for real time visualization of the vessel.  Alexis Sanders 08/21/2016, 11:45 AM

## 2016-08-21 NOTE — Progress Notes (Signed)
SLP Cancellation Note  Patient Details Name: Alexis Sanders MRN: [id number] DOB: 06/21/1951   Cancelled treatment:       Reason Eval/Treat Not Completed: Medical issues which prohibited therapy; intubated.  Our services will sign off.  Please refer to SLP services when pt extubated.   [name], [name] 08/21/2016, 2:26 PM

## 2016-08-21 NOTE — Progress Notes (Signed)
Consent for central line verified with spouse Charlynne PanderSteve Prescher, with Scot DockKara Mckibben as second RN to verify.

## 2016-08-21 NOTE — Progress Notes (Signed)
Name: Alexis Sanders MRN: [id number] DOB: 08/11/1951    ADMISSION DATE:  08/17/2016 CONSULTATION DATE:  08/20/2016  REFERRING MD :  Waymon AmatoHongalgi  CHIEF COMPLAINT:  Shortness of Breath  HISTORY OF PRESENT ILLNESS:   65 yo female presented to ER in with fever, confusion, increased tremor, and non productive cough.  Found to have pneumonia with concern for aspiration.  She did not have improvement with diuresis.  She had progressive respiratory distress and oxygen needs, and ultimately required intubation.  SUBJECTIVE:  Feels like she is struggling to breath.  Expressed that she needs more help with her breathing.  VITAL SIGNS: BP 133/77 (BP Location: Left Arm)   Pulse 94   Temp 97.8 F (36.6 C) (Axillary)   Resp (!) 22   Ht 5\' 3"  (1.6 m)   Wt 142 lb 13.7 oz (64.8 kg)   SpO2 97%   BMI 25.31 kg/m    INTAKE/OUTPUT: I/O last 3 completed shifts: In: 600 [IV Piggyback:600] Out: 3325 [Urine:3325]  VENT SETTINGS:    PHYSICAL EXAMINATION: General: tremulous Neuro: follows commands HEENT: NRB mask on Cardiovascular: regular, no murmur Lungs: poor air movement, abdominal breathing, b/l crackles Abdomen:  Soft, non-tender, decreased bowel sounds Musculoskeletal: No edema Skin: no rashes   CBC Recent Labs     08/19/16  0128  WBC  7.2  HGB  9.1*  HCT  28.9*  PLT  183    Coag's No results for input(s): APTT, INR in the last 72 hours.  BMET Recent Labs     08/19/16  1121  08/20/16  0330  08/21/16  0208  NA  147*  149*  151*  K  3.7  3.5  2.8*  CL  122*  118*  112*  CO2  19*  23  27  BUN  22*  20  30*  CREATININE  1.61*  1.66*  1.82*  GLUCOSE  205*  108*  133*    Electrolytes Recent Labs     08/19/16  1121  08/20/16  0330  08/21/16  0208  CALCIUM  9.1  9.2  9.7  MG   --    --   1.8  PHOS   --    --   2.7    Sepsis Markers Recent Labs     08/20/16  1242  08/20/16  1434  08/21/16  0208  PROCALCITON  0.35  0.34  0.43    ABG Recent Labs   08/20/16  1146  PHART  7.416  PCO2ART  38.3  PO2ART  80.3*    Liver Enzymes No results for input(s): AST, ALT, ALKPHOS, BILITOT, ALBUMIN in the last 72 hours.  Cardiac Enzymes No results for input(s): TROPONINI, PROBNP in the last 72 hours.  Glucose Recent Labs     08/20/16  1645  08/20/16  2042  08/21/16  0002  08/21/16  0437  08/21/16  0748  08/21/16  0758  GLUCAP  198*  199*  168*  149*  180*  161*    Imaging Dg Chest Port 1 View  Result Date: 08/21/2016 CLINICAL DATA:  Respiratory failure. EXAM: PORTABLE CHEST 1 VIEW COMPARISON:  08/20/2016.  08/19/2016. FINDINGS: Mediastinum and hilar structures normal. Heart size stable. Diffuse prominent bilateral airspace disease again noted with slight improvement from prior exam. No pleural effusion or pneumothorax. IMPRESSION: Diffuse prominent bilateral airspace disease again noted. Slight improvement from prior exam . Electronically Signed   By: Maisie Fushomas  Register   On: 08/21/2016 07:20  Dg Chest Port 1 View  Result Date: 08/20/2016 CLINICAL DATA:  UTI.  Pneumonia. EXAM: PORTABLE CHEST 1 VIEW COMPARISON:  08/19/2016 . FINDINGS: Heart size stable. Diffuse bilateral pulmonary infiltrates are again noted. Findings consist with diffuse bilateral pneumonia. Pulmonary edema could also present this fashion. Small right pleural effusion cannot be excluded. No pneumothorax . IMPRESSION: Diffuse bilateral pulmonary infiltrates and/or edema. No interim change. Tiny right pleural effusion cannot be excluded. Electronically Signed   By: Maisie Fus  Register   On: 08/20/2016 08:12   Dg Chest Port 1 View  Result Date: 08/19/2016 CLINICAL DATA:  Shortness of breath. EXAM: PORTABLE CHEST 1 VIEW COMPARISON:  08/17/2016. FINDINGS: Mediastinum hilar structures are stable. Heart size stable. Diffuse bilateral pulmonary infiltrates are noted, these have increased from prior exam. These could represent bilateral infectious infiltrates or bilateral pulmonary  edema. Small bilateral pleural effusions cannot be excluded. No pneumothorax. Thoracic spine scoliosis and degenerative change. Surgical clips right upper quadrant. IMPRESSION: Diffuse progressive bilateral pulmonary infiltrates consistent with pneumonia and/or bilateral pulmonary edema. Electronically Signed   By: Maisie Fus  Register   On: 08/19/2016 17:00   Dg Swallowing Func-speech Pathology  Result Date: 08/20/2016 Objective Swallowing Evaluation: Type of Study: MBS-Modified Barium Swallow Study Patient Details Name: Alexis Sanders MRN: [id number] Date of Birth: [birth year] Today's Date: 08/20/2016 Time: SLP Start Time (ACUTE ONLY): 1310-SLP Stop Time (ACUTE ONLY): 1323 SLP Time Calculation (min) (ACUTE ONLY): 13 min Past Medical History: Past Medical History: Diagnosis Date . Diabetes mellitus without complication (HCC)  . Hypothyroidism  . Parkinson disease [facility])  Past Surgical History: Past Surgical History: Procedure Laterality Date . ABDOMINAL HYSTERECTOMY   . CHOLECYSTECTOMY   . TONSILLECTOMY   HPI: 65 year old female with a history of DM 2, hypothyroidism, CKD, bipolar disorder, and parkinsonism presentedwith 3-4 day history of increasing confusion and tremors. The patient was seen at [facility] on 08/16/2016. She was discharged from the emergency department with prescriptions for levofloxacin, promethazine, and albuterol. Unfortunately, the patient's symptoms persisted despite taking levofloxacin. As a result, the patient was brought to the emergency department at [facility] via EMS. Patient has had a nonproductive cough but no vomiting, diarrhea, dysuria. History was obtained from the patient's husband as the patient was encephalopathic. Workup in the emergency department revealed significant pyuria and chest x-ray showing bilateral consolidations, right greater than left. Subjective: pt alert Assessment / Plan / Recommendation CHL IP CLINICAL IMPRESSIONS 08/20/2016 Therapy Diagnosis Mild oral phase  dysphagia;Mild pharyngeal phase dysphagia Clinical Impression Patient presents with a mild oropharyngeal dysphagia. Oral phase marked by lingual and labial tremors decreasing oral coordination of bolus however patient compensates well with use of straw which facilitates improved oral control of bolus. Decreased base of tongue strength and laryngeal closure (likely due to decline in respiratory function) result in mild silent penetration of thin liquids. Although use of chin tuck prevents frank penetration in 90% of boluses, current respiratory function and high O2 needs increase aspiration risk. Recommend initiation of conservative diet with close f/u at bedside. Prognosis for ability to advance diet good with improved respiratory function.  Impact on safety and function Moderate aspiration risk   CHL IP TREATMENT RECOMMENDATION 08/20/2016 Treatment Recommendations Therapy as outlined in treatment plan below   Prognosis 08/20/2016 Prognosis for Safe Diet Advancement Good Barriers to Reach Goals -- Barriers/Prognosis Comment -- CHL IP DIET RECOMMENDATION 08/20/2016 SLP Diet Recommendations Dysphagia 1 (Puree) solids;Nectar thick liquid Liquid Administration via Straw Medication Administration Whole meds with puree Compensations Slow rate;Small sips/bites Postural  Changes Seated upright at 90 degrees   CHL IP OTHER RECOMMENDATIONS 08/20/2016 Recommended Consults -- Oral Care Recommendations Oral care BID Other Recommendations Order thickener from pharmacy;Prohibited food (jello, ice cream, thin soups);Remove water pitcher   CHL IP FOLLOW UP RECOMMENDATIONS 08/20/2016 Follow up Recommendations (No Data)   CHL IP FREQUENCY AND DURATION 08/20/2016 Speech Therapy Frequency (ACUTE ONLY) min 2x/week Treatment Duration 2 weeks      CHL IP ORAL PHASE 08/20/2016 Oral Phase Impaired Oral - Pudding Teaspoon -- Oral - Pudding Cup -- Oral - Honey Teaspoon -- Oral - Honey Cup -- Oral - Nectar Teaspoon -- Oral - Nectar Cup -- Oral - Nectar  Straw -- Oral - Thin Teaspoon -- Oral - Thin Cup -- Oral - Thin Straw -- Oral - Puree Lingual/palatal residue Oral - Mech Soft Lingual/palatal residue Oral - Regular -- Oral - Multi-Consistency -- Oral - Pill -- Oral Phase - Comment labial, lingual tremors decreased coordination  CHL IP PHARYNGEAL PHASE 08/20/2016 Pharyngeal Phase Impaired Pharyngeal- Pudding Teaspoon -- Pharyngeal -- Pharyngeal- Pudding Cup -- Pharyngeal -- Pharyngeal- Honey Teaspoon -- Pharyngeal -- Pharyngeal- Honey Cup -- Pharyngeal -- Pharyngeal- Nectar Teaspoon Pharyngeal residue - valleculae Pharyngeal -- Pharyngeal- Nectar Cup -- Pharyngeal -- Pharyngeal- Nectar Straw Pharyngeal residue - valleculae Pharyngeal -- Pharyngeal- Thin Teaspoon -- Pharyngeal -- Pharyngeal- Thin Cup -- Pharyngeal -- Pharyngeal- Thin Straw Penetration/Aspiration during swallow;Reduced airway/laryngeal closure;Pharyngeal residue - valleculae Pharyngeal Material enters airway, remains ABOVE vocal cords and not ejected out Pharyngeal- Puree Delayed swallow initiation-vallecula;Pharyngeal residue - valleculae;Reduced tongue base retraction Pharyngeal -- Pharyngeal- Mechanical Soft Pharyngeal residue - valleculae Pharyngeal -- Pharyngeal- Regular -- Pharyngeal -- Pharyngeal- Multi-consistency -- Pharyngeal -- Pharyngeal- Pill -- Pharyngeal -- Pharyngeal Comment --  CHL IP CERVICAL ESOPHAGEAL PHASE 08/20/2016 Cervical Esophageal Phase WFL Pudding Teaspoon -- Pudding Cup -- Honey Teaspoon -- Honey Cup -- Nectar Teaspoon -- Nectar Cup -- Nectar Straw -- Thin Teaspoon -- Thin Cup -- Thin Straw -- Puree -- Mechanical Soft -- Regular -- Multi-consistency -- Pill -- Cervical Esophageal Comment -- Ferdinand Lango MA, CCC-SLP [phone] McCoy Leah Meryl 08/20/2016, 2:52 PM                  SIGNIFICANT EVENTS  10/29 seen at [facility] > given levaquin 10/30 admitted [facility] 11/02 PCCM called  STUDIES:  CT head 10/31 > negative  CULTURES: Urine 10/30 >> multiple species Blood  10/30 >> Blood 10/30 >>  Respiratory viral panel 11/02 >> negative Sputum 11/03 >>  ANTIBIOTICS: Rocephin 10/30 >> 10/30 Zithromax 10/30 >> 11/03 Primaxin 11/01 >> 11/03  Meropenem 11/03 >>   LINES/TUBES: ETT 11/03 >>  IJ CVL 11/03 >>   BRIEF PATIENT DESCRIPTION:  65 y/o female with history of Parkinson's disease developed acute hypoxic respiratory failure from aspiration pneumonia.  She has PMHx of DM2, Bipolar disorder, and Hypothyroidism.    ASSESSMENT / PLAN:  Acute hypoxic respiratory failure 2nd to aspiration pneumonia. - proceed with intubation 11/03 - f/u CXR, ABG - change Abx to meropenem 11/03 - f/u sputum culture - f/u Echo  AKI 2nd to volume depletion. Hypernatremia. Hypokalemia. - D5W IV fluid at 50 ml/hr - replace electrolytes as needed - f/u BMET - d/c lasix  Hx of DM type II, hypothyroidism. - SSI - continue synthroid  Sedation on vent. - RASS goal -1  Drug induced Parkinson's disease. Bipolar disorder. - hold amantadine, abilify, bupropion, klonopin, cymbalta, neurontin, trazodone  Anemia of critical illness and chronic disease. - f/u CBC  Nutrition. Dysphagia. - tube feeds while on vent  DVT prophylaxis - Lovenox SUP - Protonix Goals of care - full code  Spoke with pt's husband by phone and updated him about plan of care.  CC time 43 minutes independent of procedure time.  Coralyn HellingVineet Karenann Mcgrory, MD [facility] 08/21/2016, 10:14 AM Pager:  [phone] After 3pm call: [phone]

## 2016-08-21 NOTE — Progress Notes (Signed)
eLink Physician-Brief Progress Note Patient Name: [name] Hlavaty DOB: 12/01/1950 MRN: [id number]   Date of Service  08/21/2016  HPI/Events of Note  K+ = 2.9 and Creatinine = 2.06.   eICU Interventions  Will replace K+.     Intervention Category Intermediate Interventions: Electrolyte abnormality - evaluation and management  Dekker Verga Eugene 08/21/2016, 7:53 PM

## 2016-08-21 NOTE — Progress Notes (Signed)
Initial Nutrition Assessment  DOCUMENTATION CODES:   Not applicable   INTERVENTION:   - Initiate Vital AF 1.2 @ 50 mL/hr (1200 mL) to provide 1440 kcal (102% estimated energy needs), 90 grams protein (100% estimated protein needs), and 972 mL free water daily. - Needs NFPE at follow-up.  NUTRITION DIAGNOSIS:   Inadequate oral intake related to inability to eat as evidenced by NPO status.  GOAL:   Patient will meet greater than or equal to 90% of their needs  MONITOR:   Vent status, Labs, Weight trends, TF tolerance  REASON FOR ASSESSMENT:   Consult Enteral/tube feeding initiation and management  ASSESSMENT:   65 y.o. female with history of Parkinson's disease started on amantadine 3 months ago, diabetes mellitus type 2, bipolar disorder, hypothyroidism brought to the ER after patient was having increasing confusion and tremors.  Pt is currently intubated on ventilator support. MV: 8.2 L/min Highest temperature in 24 hours: 37.4 degrees Celsius BP (MAP): 132/82 (98) Drips: Fentanyl @ 50 mcg/hr  Pt with Cortrak tube placed 11/3. Per RN in room at time of visit, TF held until placement of Cortrak can be confirmed via abdominal x-ray. Findings of x-ray show Cortrak with tube in distal duodenum near Ligament of Treitz. Vital High Protein hung at bedside per protocol TF orders. Will change TF orders to meet pt's needs.  Medications reviewed and include 30 mg Lovenox daily, sliding scale Novolog, 125 mcg Synthroid daily, D5 @ 50 mL (provides 204 kcal daily), PRN Dulcolax, PRN Colace  Labs reviewed and include elevated sodium (151 mmol/L), low potassium (2.8 mmol/L), elevated BUN (30 mg/dL), elevated creatinine (1.82 mg/dL) CBG's: [id number] mg/dL  NFPE postponed due to pt comfort. Per RN, pt has been in a lot of pain.  Diet Order:  Diet NPO time specified  Skin:  Reviewed, no issues  Last BM:  08/17/16  Height:   Ht Readings from Last 1 Encounters:  08/21/16 5\' 3"  (1.6  m)    Weight:    Wt Readings from Last 1 Encounters:  08/21/16 142 lb 13.7 oz (64.8 kg)    Ideal Body Weight:  52.3 kg  BMI:  Body mass index is 25.31 kg/m.  Estimated Nutritional Needs:   Kcal:  1407  Protein:  90-104 grams (1.4-1.6 g/kg)  Fluid:  >/= 1.5 L/day  EDUCATION NEEDS:   No education needs identified at this time  Rosemarie AxKate Angalina Ante Dietetic Intern Pager Number: [phone]

## 2016-08-21 NOTE — Progress Notes (Signed)
Notified MD Elink RN of K+ of 2.9, MD Arsenio LoaderSommer to review. No new orders. Will continue to monitor.

## 2016-08-21 NOTE — Progress Notes (Signed)
Pharmacy Antibiotic Note  Alexis Sanders is a 65 y.o. female admitted on 08/17/2016 with pneumonia.  Patient was originally started on ceftriaxone and azithromycin. Changed to imipenem and azithromycin to cover for HCAP With drug shortage will change primaxin to meropenem Cr 1.8 CrCl approx 5030ml/min  Plan:  D/c primaxin  Meropenem 1gm q12hr Azithromycin 500 mg q24h - d/c per MD Monitor cx, lot  Height: 5\' 3"  (160 cm) Weight: 142 lb 13.7 oz (64.8 kg) IBW/kg (Calculated) : 52.4  Temp (24hrs), Avg:98.5 F (36.9 C), Min:97.5 F (36.4 C), Max:99.3 F (37.4 C)   Recent Labs Lab 08/17/16 1855 08/17/16 1913 08/17/16 2234 08/18/16 0142 08/18/16 0143 08/18/16 0249 08/19/16 0128 08/19/16 0708 08/19/16 0949 08/19/16 1121 08/20/16 0330 08/21/16 0208  WBC 6.8  --   --  6.5  --   --  7.2  --   --   --   --   --   CREATININE 2.26*  --   --  1.93*  --   --  1.74* 1.65* 1.56* 1.61* 1.66* 1.82*  LATICACIDVEN  --  1.78 0.60  --  0.6 0.7  --   --   --   --   --   --     Estimated Creatinine Clearance: 27.9 mL/min (by C-G formula based on SCr of 1.82 mg/dL (H)).    Allergies  Allergen Reactions  . Codeine Nausea Only  . Metformin Diarrhea  . Penicillins Rash    Has patient had a PCN reaction causing immediate rash, facial/tongue/throat swelling, SOB or lightheadedness with hypotension:Yes Has patient had a PCN reaction causing severe rash involving mucus membranes or skin necrosis:unsure. Has patient had a PCN reaction that required hospitalization:No Has patient had a PCN reaction occurring within the last 10 years:No If all of the above answers are "NO", then may proceed with Cephalosporin use. Has patient had a PCN reaction causing immediate rash, faci    Antimicrobials this admission: Ceftriaxone 10/30 >> 10/31 Azithromycin 10/30 >>11/3 Primaxin 10/31>>11/3 Meropenem 11/3> Dose adjustments this admission: N/A  Microbiology results: 10/30 BCx: ngtd 10/30 UCx: mult  spec MRSA PCR Pos 10/31 Flu neg   Leota SauersLisa Demarco Bacci Pharm.D. CPP, BCPS Clinical Pharmacist [phone] 08/21/2016 10:38 AM

## 2016-08-22 ENCOUNTER — Encounter (HOSPITAL_COMMUNITY): Payer: Self-pay | Admitting: Emergency Medicine

## 2016-08-22 ENCOUNTER — Inpatient Hospital Stay (HOSPITAL_COMMUNITY): Payer: Medicare Other

## 2016-08-22 DIAGNOSIS — N179 Acute kidney failure, unspecified: Secondary | ICD-10-CM

## 2016-08-22 DIAGNOSIS — J9600 Acute respiratory failure, unspecified whether with hypoxia or hypercapnia: Secondary | ICD-10-CM

## 2016-08-22 DIAGNOSIS — N183 Chronic kidney disease, stage 3 (moderate): Secondary | ICD-10-CM

## 2016-08-22 DIAGNOSIS — J690 Pneumonitis due to inhalation of food and vomit: Secondary | ICD-10-CM

## 2016-08-22 LAB — MAGNESIUM
MAGNESIUM: 1.9 mg/dL (ref 1.7–2.4)
MAGNESIUM: 1.8 mg/dL (ref 1.7–2.4)

## 2016-08-22 LAB — BASIC METABOLIC PANEL
ANION GAP: 10 (ref 5–15)
Anion gap: 7 (ref 5–15)
BUN: 56 mg/dL — ABNORMAL HIGH (ref 6–20)
BUN: 62 mg/dL — ABNORMAL HIGH (ref 6–20)
CHLORIDE: 112 mmol/L — AB (ref 101–111)
CHLORIDE: 111 mmol/L (ref 101–111)
CO2: 27 mmol/L (ref 22–32)
CO2: 27 mmol/L (ref 22–32)
CREATININE: 2.26 mg/dL — AB (ref 0.44–1.00)
Calcium: 9.2 mg/dL (ref 8.9–10.3)
Calcium: 8.8 mg/dL — ABNORMAL LOW (ref 8.9–10.3)
Creatinine, Ser: 2.57 mg/dL — ABNORMAL HIGH (ref 0.44–1.00)
GFR calc non Af Amer: 18 mL/min — ABNORMAL LOW (ref >60)
GFR calc non Af Amer: 22 mL/min — ABNORMAL LOW (ref >60)
GFR, EST AFRICAN AMERICAN: 21 mL/min — AB (ref >60)
GFR, EST AFRICAN AMERICAN: 25 mL/min — AB (ref >60)
Glucose, Bld: 212 mg/dL — ABNORMAL HIGH (ref 65–99)
Glucose, Bld: 217 mg/dL — ABNORMAL HIGH (ref 65–99)
POTASSIUM: 3.7 mmol/L (ref 3.5–5.1)
Potassium: 3.8 mmol/L (ref 3.5–5.1)
SODIUM: 149 mmol/L — AB (ref 135–145)
SODIUM: 145 mmol/L (ref 135–145)

## 2016-08-22 LAB — PHOSPHORUS
PHOSPHORUS: 1.6 mg/dL — AB (ref 2.5–4.6)
Phosphorus: 1.9 mg/dL — ABNORMAL LOW (ref 2.5–4.6)

## 2016-08-22 LAB — CULTURE, BLOOD (ROUTINE X 2)
CULTURE: NO GROWTH
Culture: NO GROWTH

## 2016-08-22 LAB — ECHOCARDIOGRAM COMPLETE
CHL CUP MV DEC (S): 151
E decel time: 151 msec
EERAT: 5.75
FS: 35 % (ref 28–44)
HEIGHTINCHES: 63 in
IV/PV OW: 1
LA diam end sys: 39 mm
LA vol A4C: 33.8 ml
LADIAMINDEX: 2.34 cm/m2
LASIZE: 39 mm
LDCA: 3.14 cm2
LV E/e' medial: 5.75
LV E/e'average: 5.75
LVELAT: 10.3 cm/s
LVOT diameter: 20 mm
Lateral S' vel: 20.6 cm/s
MV pk E vel: 59.2 m/s
MVPKAVEL: 81.5 m/s
PW: 8.69 mm — AB (ref 0.6–1.1)
TAPSE: 17.4 mm
TDI e' lateral: 10.3
TDI e' medial: 7.62
WEIGHTICAEL: 2257.51 [oz_av]

## 2016-08-22 LAB — CBC
HCT: 35.5 % — ABNORMAL LOW (ref 36.0–46.0)
HEMOGLOBIN: 11 g/dL — AB (ref 12.0–15.0)
MCH: 26.3 pg (ref 26.0–34.0)
MCHC: 31 g/dL (ref 30.0–36.0)
MCV: 84.9 fL (ref 78.0–100.0)
Platelets: 263 10*3/uL (ref 150–400)
RBC: 4.18 MIL/uL (ref 3.87–5.11)
RDW: 14.5 % (ref 11.5–15.5)
WBC: 13 10*3/uL — AB (ref 4.0–10.5)

## 2016-08-22 LAB — GLUCOSE, CAPILLARY
GLUCOSE-CAPILLARY: 191 mg/dL — AB (ref 65–99)
GLUCOSE-CAPILLARY: 172 mg/dL — AB (ref 65–99)
GLUCOSE-CAPILLARY: 217 mg/dL — AB (ref 65–99)
Glucose-Capillary: 230 mg/dL — ABNORMAL HIGH (ref 65–99)
Glucose-Capillary: 189 mg/dL — ABNORMAL HIGH (ref 65–99)
Glucose-Capillary: 200 mg/dL — ABNORMAL HIGH (ref 65–99)

## 2016-08-22 LAB — PROCALCITONIN: Procalcitonin: 0.42 ng/mL

## 2016-08-22 MED ORDER — FAMOTIDINE IN NACL 20-0.9 MG/50ML-% IV SOLN
20.0000 mg | INTRAVENOUS | Status: DC
  Administered 2016-08-22 – 2016-08-23 (×2): 20 mg via INTRAVENOUS
  Filled 2016-08-22 (×2): qty 50

## 2016-08-22 MED ORDER — ARIPIPRAZOLE 5 MG PO TABS
10.0000 mg | ORAL_TABLET | Freq: Every evening | ORAL | Status: DC
  Administered 2016-08-22 – 2016-08-30 (×9): 10 mg
  Filled 2016-08-22: qty 2
  Filled 2016-08-22: qty 1
  Filled 2016-08-22 (×2): qty 2
  Filled 2016-08-22 (×2): qty 1
  Filled 2016-08-22 (×4): qty 2

## 2016-08-22 MED ORDER — SODIUM CHLORIDE 0.9% FLUSH: 10.0000 mL | INTRAVENOUS | Status: DC | PRN

## 2016-08-22 MED ORDER — AMANTADINE HCL 100 MG PO CAPS
100.0000 mg | ORAL_CAPSULE | Freq: Two times a day (BID) | ORAL | Status: DC
  Administered 2016-08-22 – 2016-08-30 (×18): 100 mg
  Filled 2016-08-22 (×20): qty 1

## 2016-08-22 MED ORDER — BUPROPION HCL 100 MG PO TABS
100.0000 mg | ORAL_TABLET | Freq: Every day | ORAL | Status: DC
  Administered 2016-08-22 – 2016-08-30 (×9): 100 mg
  Filled 2016-08-22 (×11): qty 1

## 2016-08-22 MED ORDER — DULOXETINE HCL 30 MG PO CPEP
30.0000 mg | ORAL_CAPSULE | Freq: Two times a day (BID) | ORAL | Status: DC
  Administered 2016-08-22 – 2016-08-25 (×7): 30 mg via ORAL
  Filled 2016-08-22 (×8): qty 1

## 2016-08-22 MED ORDER — SODIUM CHLORIDE 0.9% FLUSH
10.0000 mL | Freq: Two times a day (BID) | INTRAVENOUS | Status: DC
  Administered 2016-08-22 (×2): 10 mL
  Administered 2016-08-23: 20 mL
  Administered 2016-08-23: 10 mL
  Administered 2016-08-24: 20 mL
  Administered 2016-08-24: 3 mL
  Administered 2016-08-25 – 2016-09-03 (×2): 10 mL

## 2016-08-22 NOTE — Progress Notes (Signed)
  Echocardiogram 2D Echocardiogram has been performed.  Alexis Sanders, Alexis Sanders 08/22/2016, 4:23 PM

## 2016-08-22 NOTE — Progress Notes (Addendum)
Name: Alexis Sanders MRN: [id number] DOB: 05/05/1951    ADMISSION DATE:  08/17/2016 CONSULTATION DATE:  08/20/2016  REFERRING MD :  Waymon AmatoHongalgi  CHIEF COMPLAINT:  Shortness of Breath  HISTORY OF PRESENT ILLNESS:   65 yo female presented to ER in with fever, confusion, increased tremor, and non productive cough.  Found to have pneumonia with concern for aspiration.  She did not have improvement with diuresis.  She had progressive respiratory distress and oxygen needs, and ultimately required intubation.  SUBJECTIVE / Interval events:  Intubated 11/3  VITAL SIGNS: BP 122/64 (BP Location: Right Arm)   Pulse 100   Temp 98.1 F (36.7 C) (Axillary)   Resp 18   Ht 5\' 3"  (1.6 m)   Wt 64 kg (141 lb 1.5 oz)   SpO2 98%   BMI 24.99 kg/m    INTAKE/OUTPUT: I/O last 3 completed shifts: In: 2935.5 [I.V.:1130.5; NG/GT:905; IV Piggyback:900] Out: 3110 [Urine:3110]  VENT SETTINGS: Vent Mode: CPAP FiO2 (%):  [40 %-50 %] 40 % Set Rate:  [16 bmp] 16 bmp Vt Set:  [420 mL] 420 mL PEEP:  [5 cmH20] 5 cmH20 Pressure Support:  [5 cmH20] 5 cmH20 Plateau Pressure:  [15 cmH20-35 cmH20] 17 cmH20  PHYSICAL EXAMINATION: General:  Ill appearing, some tremor Neuro: follows commands HEENT: ETT in place Cardiovascular: regular, no murmur Lungs: poor air movement, abdominal breathing, b/l crackles Abdomen:  Soft, non-tender, decreased bowel sounds Musculoskeletal: No edema Skin: no rashes   CBC Recent Labs     08/22/16  0350  WBC  13.0*  HGB  11.0*  HCT  35.5*  PLT  263    Coag's No results for input(s): APTT, INR in the last 72 hours.  BMET Recent Labs     08/21/16  0208  08/21/16  1700  08/22/16  0350  NA  151*  149*  149*  K  2.8*  2.9*  3.8  CL  112*  109  112*  CO2  27  30  27   BUN  30*  40*  56*  CREATININE  1.82*  2.06*  2.57*  GLUCOSE  133*  180*  212*    Electrolytes Recent Labs     08/21/16  0208  08/21/16  1300  08/21/16  1700  08/22/16  0350  CALCIUM  9.7    --   9.3  9.2  MG  1.8  2.0  1.9  1.9  PHOS  2.7  4.3  3.0  1.9*    Sepsis Markers Recent Labs     08/20/16  1434  08/21/16  0208  08/22/16  0350  PROCALCITON  0.34  0.43  0.42    ABG Recent Labs     08/20/16  1146  08/21/16  1427  PHART  7.416  7.449  PCO2ART  38.3  45.9  PO2ART  80.3*  265.0*    Liver Enzymes No results for input(s): AST, ALT, ALKPHOS, BILITOT, ALBUMIN in the last 72 hours.  Cardiac Enzymes No results for input(s): TROPONINI, PROBNP in the last 72 hours.  Glucose Recent Labs     08/21/16  1536  08/21/16  2034  08/21/16  2354  08/22/16  0402  08/22/16  0814  08/22/16  1129  GLUCAP  179*  218*  249*  191*  230*  189*    Imaging Dg Chest Port 1 View  Result Date: 08/22/2016 CLINICAL DATA:  Respiratory failure EXAM: PORTABLE CHEST 1 VIEW COMPARISON:  08/21/2016 FINDINGS: Cardiac shadow  is stable. Endotracheal tube, feeding catheter and left jugular central line are noted in satisfactory position. The lungs are well aerated with some slight increased density in the right lung base although this may be projectional in nature. The overall inspiratory effort is decreased from the prior exam. Patchy changes in the left lung have improved in the interval. IMPRESSION: Slight increase in the degree of right basilar infiltrate. Electronically Signed   By: Alcide Clever M.D.   On: 08/22/2016 08:19   Dg Chest Port 1 View  Result Date: 08/21/2016 CLINICAL DATA:  Central line placement. EXAM: PORTABLE CHEST 1 VIEW COMPARISON:  August 21, 2016 FINDINGS: An ET tube is in good position. A new left central line terminates in the SVC. No pneumothorax. Bilateral pulmonary opacities, right greater than left, persist but are mildly improved. Recommend follow-up to complete resolution. IMPRESSION: Support apparatus is in good position.  No pneumothorax. Persistent but mildly improved bilateral pulmonary opacities. Electronically Signed   By: Gerome Sam III M.D   On:  08/21/2016 12:15   Dg Chest Port 1 View  Result Date: 08/21/2016 CLINICAL DATA:  Respiratory failure. EXAM: PORTABLE CHEST 1 VIEW COMPARISON:  08/20/2016.  08/19/2016. FINDINGS: Mediastinum and hilar structures normal. Heart size stable. Diffuse prominent bilateral airspace disease again noted with slight improvement from prior exam. No pleural effusion or pneumothorax. IMPRESSION: Diffuse prominent bilateral airspace disease again noted. Slight improvement from prior exam . Electronically Signed   By: Maisie Fus  Register   On: 08/21/2016 07:20   Dg Abd Portable 1v  Result Date: 08/21/2016 CLINICAL DATA:  Check feeding catheter placement EXAM: PORTABLE ABDOMEN - 1 VIEW COMPARISON:  None. FINDINGS: Contrast material is noted throughout the colon consistent with recent modified barium swallow. A feeding catheter is noted in the distal duodenum near the ligament of Treitz. Postsurgical changes are seen. No acute bony abnormality is noted. Changes of mild constipation are seen. IMPRESSION: Feeding catheter in satisfactory position distally in the duodenum. Electronically Signed   By: Alcide Clever M.D.   On: 08/21/2016 16:35   Dg Swallowing Func-speech Pathology  Result Date: 08/20/2016 Objective Swallowing Evaluation: Type of Study: MBS-Modified Barium Swallow Study Patient Details Name: Alexis Sanders MRN: [id number] Date of Birth: 08-Oct-1951 Today's Date: 08/20/2016 Time: SLP Start Time (ACUTE ONLY): 1310-SLP Stop Time (ACUTE ONLY): 1323 SLP Time Calculation (min) (ACUTE ONLY): 13 min Past Medical History: Past Medical History: Diagnosis Date . Diabetes mellitus without complication (HCC)  . Hypothyroidism  . Parkinson disease [facility])  Past Surgical History: Past Surgical History: Procedure Laterality Date . ABDOMINAL HYSTERECTOMY   . CHOLECYSTECTOMY   . TONSILLECTOMY   HPI: 65 year old female with a history of DM 2, hypothyroidism, CKD, bipolar disorder, and parkinsonism presentedwith 3-4 day history of  increasing confusion and tremors. The patient was seen at [facility] on 08/16/2016. She was discharged from the emergency department with prescriptions for levofloxacin, promethazine, and albuterol. Unfortunately, the patient's symptoms persisted despite taking levofloxacin. As a result, the patient was brought to the emergency department at [facility] via EMS. Patient has had a nonproductive cough but no vomiting, diarrhea, dysuria. History was obtained from the patient's husband as the patient was encephalopathic. Workup in the emergency department revealed significant pyuria and chest x-ray showing bilateral consolidations, right greater than left. Subjective: pt alert Assessment / Plan / Recommendation CHL IP CLINICAL IMPRESSIONS 08/20/2016 Therapy Diagnosis Mild oral phase dysphagia;Mild pharyngeal phase dysphagia Clinical Impression Patient presents with a mild oropharyngeal dysphagia. Oral phase marked by  lingual and labial tremors decreasing oral coordination of bolus however patient compensates well with use of straw which facilitates improved oral control of bolus. Decreased base of tongue strength and laryngeal closure (likely due to decline in respiratory function) result in mild silent penetration of thin liquids. Although use of chin tuck prevents frank penetration in 90% of boluses, current respiratory function and high O2 needs increase aspiration risk. Recommend initiation of conservative diet with close f/u at bedside. Prognosis for ability to advance diet good with improved respiratory function.  Impact on safety and function Moderate aspiration risk   CHL IP TREATMENT RECOMMENDATION 08/20/2016 Treatment Recommendations Therapy as outlined in treatment plan below   Prognosis 08/20/2016 Prognosis for Safe Diet Advancement Good Barriers to Reach Goals -- Barriers/Prognosis Comment -- CHL IP DIET RECOMMENDATION 08/20/2016 SLP Diet Recommendations Dysphagia 1 (Puree) solids;Nectar thick liquid Liquid  Administration via Straw Medication Administration Whole meds with puree Compensations Slow rate;Small sips/bites Postural Changes Seated upright at 90 degrees   CHL IP OTHER RECOMMENDATIONS 08/20/2016 Recommended Consults -- Oral Care Recommendations Oral care BID Other Recommendations Order thickener from pharmacy;Prohibited food (jello, ice cream, thin soups);Remove water pitcher   CHL IP FOLLOW UP RECOMMENDATIONS 08/20/2016 Follow up Recommendations (No Data)   CHL IP FREQUENCY AND DURATION 08/20/2016 Speech Therapy Frequency (ACUTE ONLY) min 2x/week Treatment Duration 2 weeks      CHL IP ORAL PHASE 08/20/2016 Oral Phase Impaired Oral - Pudding Teaspoon -- Oral - Pudding Cup -- Oral - Honey Teaspoon -- Oral - Honey Cup -- Oral - Nectar Teaspoon -- Oral - Nectar Cup -- Oral - Nectar Straw -- Oral - Thin Teaspoon -- Oral - Thin Cup -- Oral - Thin Straw -- Oral - Puree Lingual/palatal residue Oral - Mech Soft Lingual/palatal residue Oral - Regular -- Oral - Multi-Consistency -- Oral - Pill -- Oral Phase - Comment labial, lingual tremors decreased coordination  CHL IP PHARYNGEAL PHASE 08/20/2016 Pharyngeal Phase Impaired Pharyngeal- Pudding Teaspoon -- Pharyngeal -- Pharyngeal- Pudding Cup -- Pharyngeal -- Pharyngeal- Honey Teaspoon -- Pharyngeal -- Pharyngeal- Honey Cup -- Pharyngeal -- Pharyngeal- Nectar Teaspoon Pharyngeal residue - valleculae Pharyngeal -- Pharyngeal- Nectar Cup -- Pharyngeal -- Pharyngeal- Nectar Straw Pharyngeal residue - valleculae Pharyngeal -- Pharyngeal- Thin Teaspoon -- Pharyngeal -- Pharyngeal- Thin Cup -- Pharyngeal -- Pharyngeal- Thin Straw Penetration/Aspiration during swallow;Reduced airway/laryngeal closure;Pharyngeal residue - valleculae Pharyngeal Material enters airway, remains ABOVE vocal cords and not ejected out Pharyngeal- Puree Delayed swallow initiation-vallecula;Pharyngeal residue - valleculae;Reduced tongue base retraction Pharyngeal -- Pharyngeal- Mechanical Soft Pharyngeal  residue - valleculae Pharyngeal -- Pharyngeal- Regular -- Pharyngeal -- Pharyngeal- Multi-consistency -- Pharyngeal -- Pharyngeal- Pill -- Pharyngeal -- Pharyngeal Comment --  CHL IP CERVICAL ESOPHAGEAL PHASE 08/20/2016 Cervical Esophageal Phase WFL Pudding Teaspoon -- Pudding Cup -- Honey Teaspoon -- Honey Cup -- Nectar Teaspoon -- Nectar Cup -- Nectar Straw -- Thin Teaspoon -- Thin Cup -- Thin Straw -- Puree -- Mechanical Soft -- Regular -- Multi-consistency -- Pill -- Cervical Esophageal Comment -- Ferdinand Lango MA, CCC-SLP [phone] McCoy Leah Meryl 08/20/2016, 2:52 PM                  SIGNIFICANT EVENTS  10/29 seen at [facility] > given levaquin 10/30 admitted [facility] 11/02 PCCM called  STUDIES:  CT head 10/31 > negative  CULTURES: Urine 10/30 >> multiple species >>  Blood 10/30 >> Blood 10/30 >>  Respiratory viral panel 11/02 >> negative Sputum 11/03 >>  ANTIBIOTICS: Rocephin 10/30 >> 10/30 Zithromax 10/30 >>  11/03 Primaxin 11/01 >> 11/03  Meropenem 11/03 >>   LINES/TUBES: ETT 11/03 >>  IJ CVL 11/03 >>   BRIEF PATIENT DESCRIPTION:  65 y/o female with history of Parkinson's disease developed acute hypoxic respiratory failure from aspiration pneumonia.  She has PMHx of DM2, Bipolar disorder, and Hypothyroidism.    ASSESSMENT / PLAN:  Acute hypoxic respiratory failure 2nd to aspiration pneumonia. - intubated 11/3, stable on MV - f/u CXR, ABG - changed Abx to meropenem 11/03 - f/u sputum culture, blood cx's - f/u Echo  AKI 2nd to volume depletion, worse 11/4 Hypernatremia. Hypokalemia. - d/c D5W now that TF are running - replace electrolytes as needed - f/u BMET and UOP - d/c'd  Lasix, gentle volume  Hx of DM type II, hypothyroidism. - SSI - continue synthroid  Sedation on vent. - RASS goal -1  Drug induced Parkinson's disease. Bipolar disorder. - hold klonopin, neurontin, trazodone - order amantadine, abilify, bupropion, cymbalta,   Anemia of critical illness  and chronic disease. - f/u CBC  Nutrition. Dysphagia. - tube feeds while on vent  DVT prophylaxis - Lovenox SUP - Protonix Goals of care - full code  Dr Craige CottaSood spoke with pt's husband by phone 11/3 and updated him about plan of care.  Independent CC time 35 minutes   Levy Pupaobert Asianna Brundage, MD, PhD 08/22/2016, 11:47 AM [facility] [phone] or if no answer [phone]

## 2016-08-23 ENCOUNTER — Inpatient Hospital Stay (HOSPITAL_COMMUNITY): Payer: Medicare Other

## 2016-08-23 DIAGNOSIS — R6521 Severe sepsis with septic shock: Secondary | ICD-10-CM

## 2016-08-23 DIAGNOSIS — A419 Sepsis, unspecified organism: Secondary | ICD-10-CM

## 2016-08-23 DIAGNOSIS — G9340 Encephalopathy, unspecified: Secondary | ICD-10-CM

## 2016-08-23 LAB — GLUCOSE, CAPILLARY
GLUCOSE-CAPILLARY: 203 mg/dL — AB (ref 65–99)
GLUCOSE-CAPILLARY: 182 mg/dL — AB (ref 65–99)
GLUCOSE-CAPILLARY: 211 mg/dL — AB (ref 65–99)
Glucose-Capillary: 201 mg/dL — ABNORMAL HIGH (ref 65–99)
Glucose-Capillary: 212 mg/dL — ABNORMAL HIGH (ref 65–99)
Glucose-Capillary: 218 mg/dL — ABNORMAL HIGH (ref 65–99)

## 2016-08-23 LAB — BASIC METABOLIC PANEL
Anion gap: 7 (ref 5–15)
BUN: 62 mg/dL — AB (ref 6–20)
CHLORIDE: 112 mmol/L — AB (ref 101–111)
CO2: 29 mmol/L (ref 22–32)
Calcium: 8.7 mg/dL — ABNORMAL LOW (ref 8.9–10.3)
Creatinine, Ser: 2.05 mg/dL — ABNORMAL HIGH (ref 0.44–1.00)
GFR calc Af Amer: 28 mL/min — ABNORMAL LOW (ref >60)
GFR calc non Af Amer: 24 mL/min — ABNORMAL LOW (ref >60)
Glucose, Bld: 222 mg/dL — ABNORMAL HIGH (ref 65–99)
POTASSIUM: 4.1 mmol/L (ref 3.5–5.1)
SODIUM: 148 mmol/L — AB (ref 135–145)

## 2016-08-23 LAB — CBC
HEMATOCRIT: 33.4 % — AB (ref 36.0–46.0)
Hemoglobin: 10.2 g/dL — ABNORMAL LOW (ref 12.0–15.0)
MCH: 25.9 pg — AB (ref 26.0–34.0)
MCHC: 30.5 g/dL (ref 30.0–36.0)
MCV: 84.8 fL (ref 78.0–100.0)
Platelets: 222 10*3/uL (ref 150–400)
RBC: 3.94 MIL/uL (ref 3.87–5.11)
RDW: 14.5 % (ref 11.5–15.5)
WBC: 9.9 10*3/uL (ref 4.0–10.5)

## 2016-08-23 LAB — CULTURE, BLOOD (ROUTINE X 2)
CULTURE: NO GROWTH
CULTURE: NO GROWTH

## 2016-08-23 MED ORDER — CHLORHEXIDINE GLUCONATE 0.12% ORAL RINSE (MEDLINE KIT)
15.0000 mL | Freq: Two times a day (BID) | OROMUCOSAL | Status: DC
  Administered 2016-08-23 (×2): 15 mL via OROMUCOSAL

## 2016-08-23 MED ORDER — ACETAMINOPHEN 160 MG/5ML PO SOLN
650.0000 mg | ORAL | Status: DC | PRN
  Administered 2016-08-23 – 2016-08-26 (×3): 650 mg
  Filled 2016-08-23 (×3): qty 20.3

## 2016-08-23 MED ORDER — ORAL CARE MOUTH RINSE: 15.0000 mL | OROMUCOSAL | Status: DC

## 2016-08-23 MED ORDER — FREE WATER
200.0000 mL | Status: DC
  Administered 2016-08-23 – 2016-08-25 (×13): 200 mL

## 2016-08-23 NOTE — Progress Notes (Signed)
Name: Alexis Sanders MRN: [id number] DOB: 07/02/1951    ADMISSION DATE:  08/17/2016 CONSULTATION DATE:  08/20/2016  REFERRING MD :  Waymon Amato  CHIEF COMPLAINT:  Shortness of Breath  HISTORY OF PRESENT ILLNESS:   65 yo female presented to ER in with fever, confusion, increased tremor, and non productive cough.  Found to have pneumonia with concern for aspiration.  She did not have improvement with diuresis.  She had progressive respiratory distress and oxygen needs, and ultimately required intubation.  SUBJECTIVE / Interval events:  Intubated 11/3  VITAL SIGNS: BP 123/76 (BP Location: Right Arm)   Pulse (!) 105   Temp (!) 100.8 F (38.2 C) (Oral)   Resp 18   Ht 5\' 3"  (1.6 m)   Wt 149 lb 7.6 oz (67.8 kg)   SpO2 100%   BMI 26.48 kg/m    INTAKE/OUTPUT: I/O last 3 completed shifts: In: 3879.7 [I.V.:824.7; Other:320; WU/JW:1191; IV Piggyback:350] Out: 1560 [Urine:1560]  VENT SETTINGS: Vent Mode: CPAP FiO2 (%):  [40 %] 40 % Set Rate:  [16 bmp] 16 bmp Vt Set:  [420 mL] 420 mL PEEP:  [5 cmH20] 5 cmH20 Pressure Support:  [5 cmH20-10 cmH20] 5 cmH20 Plateau Pressure:  [15 cmH20-16 cmH20] 15 cmH20  PHYSICAL EXAMINATION: General:  Ill appearing, some tremor, no distress. Good Vt on SBT Neuro: follows commands, generalize weakness HEENT: ETT in place Cardiovascular: regular, no murmur Lungs: equal chest rise. Crackles bases, no accessory use  Abdomen:  Soft, non-tender, decreased bowel sounds Musculoskeletal: No edema Skin: no rashes   CBC Recent Labs     08/22/16  0350  08/23/16  0445  WBC  13.0*  9.9  HGB  11.0*  10.2*  HCT  35.5*  33.4*  PLT  263  222    Coag's No results for input(s): APTT, INR in the last 72 hours.  BMET Recent Labs     08/22/16  0350  08/22/16  1630  08/23/16  0445  NA  149*  145  148*  K  3.8  3.7  4.1  CL  112*  111  112*  CO2  27  27  29   BUN  56*  62*  62*  CREATININE  2.57*  2.26*  2.05*  GLUCOSE  212*  217*  222*     Electrolytes Recent Labs     08/21/16  1700  08/22/16  0350  08/22/16  1630  08/23/16  0445  CALCIUM  9.3  9.2  8.8*  8.7*  MG  1.9  1.9  1.8   --   PHOS  3.0  1.9*  1.6*   --     Sepsis Markers Recent Labs     08/20/16  1434  08/21/16  0208  08/22/16  0350  PROCALCITON  0.34  0.43  0.42    ABG Recent Labs     08/20/16  1146  08/21/16  1427  PHART  7.416  7.449  PCO2ART  38.3  45.9  PO2ART  80.3*  265.0*    Liver Enzymes No results for input(s): AST, ALT, ALKPHOS, BILITOT, ALBUMIN in the last 72 hours.  Cardiac Enzymes No results for input(s): TROPONINI, PROBNP in the last 72 hours.  Glucose Recent Labs     08/22/16  1129  08/22/16  1628  08/22/16  1948  08/22/16  2325  08/23/16  0337  08/23/16  0757  GLUCAP  189*  200*  172*  217*  212*  203*  Imaging Dg Chest Port 1 View  Result Date: 08/23/2016 CLINICAL DATA:  Acute respiratory failure, diabetes mellitus, Parkinson's EXAM: PORTABLE CHEST 1 VIEW COMPARISON:  Portable exam 0408 hours compared to 08/22/2016 FINDINGS: Tip of endotracheal tube projects 2.8 cm above carina. Feeding tube extends through stomach. LEFT jugular central venous catheter with tip projecting over SVC. Normal heart size, mediastinal contours, and pulmonary vascularity. Slightly improved RIGHT basilar infiltrate. Mild retrocardiac LEFT lower lobe infiltrate suspected, new. Upper lungs clear. No pleural effusion or pneumothorax. IMPRESSION: Improved RIGHT basilar infiltrate with new mild retrocardiac LEFT lower lobe infiltrate. Electronically Signed   By: Ulyses SouthwardMark  Boles M.D.   On: 08/23/2016 07:14   Dg Chest Port 1 View  Result Date: 08/22/2016 CLINICAL DATA:  Respiratory failure EXAM: PORTABLE CHEST 1 VIEW COMPARISON:  08/21/2016 FINDINGS: Cardiac shadow is stable. Endotracheal tube, feeding catheter and left jugular central line are noted in satisfactory position. The lungs are well aerated with some slight increased density in the  right lung base although this may be projectional in nature. The overall inspiratory effort is decreased from the prior exam. Patchy changes in the left lung have improved in the interval. IMPRESSION: Slight increase in the degree of right basilar infiltrate. Electronically Signed   By: Alcide CleverMark  Lukens M.D.   On: 08/22/2016 08:19   Dg Chest Port 1 View  Result Date: 08/21/2016 CLINICAL DATA:  Central line placement. EXAM: PORTABLE CHEST 1 VIEW COMPARISON:  August 21, 2016 FINDINGS: An ET tube is in good position. A new left central line terminates in the SVC. No pneumothorax. Bilateral pulmonary opacities, right greater than left, persist but are mildly improved. Recommend follow-up to complete resolution. IMPRESSION: Support apparatus is in good position.  No pneumothorax. Persistent but mildly improved bilateral pulmonary opacities. Electronically Signed   By: Gerome Samavid  Williams III M.D   On: 08/21/2016 12:15   Dg Abd Portable 1v  Result Date: 08/21/2016 CLINICAL DATA:  Check feeding catheter placement EXAM: PORTABLE ABDOMEN - 1 VIEW COMPARISON:  None. FINDINGS: Contrast material is noted throughout the colon consistent with recent modified barium swallow. A feeding catheter is noted in the distal duodenum near the ligament of Treitz. Postsurgical changes are seen. No acute bony abnormality is noted. Changes of mild constipation are seen. IMPRESSION: Feeding catheter in satisfactory position distally in the duodenum. Electronically Signed   By: Alcide CleverMark  Lukens M.D.   On: 08/21/2016 16:35       SIGNIFICANT EVENTS  [facility] > given [facility]quin 10[facility] PCCM called 11/5 extubated  STUDIES:  CT head 10/31 > negative  CULTURES: Urine 10/30 >> multiple species >>  Blood 10/30 >> Blood 10/30 >>  Respiratory viral panel 11/02 >> negative Sputum 11/03 >>  ANTIBIOTICS: Rocephin 10/30 >> 10/30 Zithromax 10/30 >> 11/03 Primaxin 11/01 >> 11/03  Meropenem 11/03 >>    LINES/TUBES: ETT 11/03 >> 11/5 IJ CVL 11/03 >>   BRIEF PATIENT DESCRIPTION:  65 y/o female with history of Parkinson's disease developed acute hypoxic respiratory failure from aspiration pneumonia.  She has PMHx of DM2, Bipolar disorder, and Hypothyroidism.  CXR improved, MS improved, passed SBT. Now ready for extubation. Will cont meropenem, extubate, wean O2 and mobilize. Will cont tube feeds and meds Via NGT today. Will need SLP re-assessment as she gets stronger. I suspect that she may need re-evaluation of her dysphagia restrictions.   ASSESSMENT / PLAN:  Acute hypoxic respiratory failure 2nd to aspiration pneumonia. intubated 11/3, stable on MV Aeration  improved on CXR Passed SBT - extubate - dc all sedation  - wean O2, mobilize  - NPO and re-assess swallowing - cont current abx - f/u sputum culture, blood cx's - f/u Echo  AKI 2nd to volume depletion, slowly improving Hypernatremia. Hyperchloremia  Hypokalemia. - cont free water replacement-->adjusted  - replace electrolytes as needed - f/u BMET and UOP  Hx of DM type II, hypothyroidism. - SSI - continue synthroid   Drug induced Parkinson's disease. Bipolar disorder. - hold klonopin, neurontin, trazodone - cont amantadine, abilify, bupropion, cymbalta - PT/OT eval - unclear if she can return to her current living situation   Anemia of critical illness and chronic disease. - f/u CBC  Nutrition. Dysphagia. - tube feeds for now.  - will need SLP eval to assess swallowing when stronger. May need to modify dysphagia precautions further   DVT prophylaxis - Lovenox SUP - Protonix Goals of care - full code  Dr Craige CottaSood spoke with pt's husband by phone 11/3 and updated him about plan of care   Simonne MartinetPeter E Babcock ACNP-BC [facility] Pager # [phone] OR # [phone] if no answer  Attending Note:  I have examined patient, reviewed labs, studies and notes. I have discussed the case with Kreg ShropshireP Babcock,  and I agree with the data and plans as amended above. 65 yo woman with Parkinson's and probable chronic aspiration, on MV for aspiration PNA. On eval today she is awake, stronger, is tolerating PSV. I believe she can be extubated. Will need to re-involve SLP for swallow eval and aspiration precautions. Continue current abx. Hold sedating meds, start back home regimen carefully.  Independent critical care time is 40 minutes.   Levy Pupaobert Chinmayi Rumer, MD, PhD 08/23/2016, 12:17 PM [facility] [phone] or if no answer [phone]

## 2016-08-23 NOTE — Progress Notes (Signed)
Pt extubated with out NIF<VFC per Doctor. Pt placed on 4 L [place]. And her vitals are good. Rt will continue to monitor

## 2016-08-23 NOTE — Progress Notes (Signed)
Wasted 230 ML of Fentanyl in sink with Sedonia SmallSusan Moore, RN.

## 2016-08-23 NOTE — Procedures (Signed)
Extubation Procedure Note  Patient Details:   Name: Alexis Sanders DOB: 12/15/1950 MRN: [id number]   Airway Documentation:  Airway 7.5 mm (Active)  Secured at (cm) 22 cm 08/23/2016  7:44 AM  Measured From Lips 08/23/2016  7:44 AM  Secured Location Left 08/23/2016  7:44 AM  Secured By [employer] Tube Holder 08/23/2016  7:44 AM  Tube Holder Repositioned Yes 08/23/2016  7:44 AM  Cuff Pressure (cm H2O) 24 cm H2O 08/23/2016  7:44 AM  Site Condition Dry 08/23/2016  7:44 AM    Evaluation  O2 sats: stable throughout Complications: No apparent complications Patient did tolerate procedure well. Bilateral Breath Sounds: Clear   Yes  Katheren Shamsowell, Krisann Mckenna Farmer 08/23/2016, 10:43 AM

## 2016-08-24 DIAGNOSIS — G20 Parkinson's disease: Secondary | ICD-10-CM

## 2016-08-24 LAB — COMPREHENSIVE METABOLIC PANEL
ALBUMIN: 1.9 g/dL — AB (ref 3.5–5.0)
ALT: 55 U/L — ABNORMAL HIGH (ref 14–54)
ANION GAP: 7 (ref 5–15)
AST: 43 U/L — ABNORMAL HIGH (ref 15–41)
Alkaline Phosphatase: 145 U/L — ABNORMAL HIGH (ref 38–126)
BUN: 50 mg/dL — ABNORMAL HIGH (ref 6–20)
CHLORIDE: 111 mmol/L (ref 101–111)
CO2: 29 mmol/L (ref 22–32)
Calcium: 8.5 mg/dL — ABNORMAL LOW (ref 8.9–10.3)
Creatinine, Ser: 1.61 mg/dL — ABNORMAL HIGH (ref 0.44–1.00)
GFR calc non Af Amer: 33 mL/min — ABNORMAL LOW (ref >60)
GFR, EST AFRICAN AMERICAN: 38 mL/min — AB (ref >60)
Glucose, Bld: 193 mg/dL — ABNORMAL HIGH (ref 65–99)
Potassium: 3.7 mmol/L (ref 3.5–5.1)
SODIUM: 147 mmol/L — AB (ref 135–145)
Total Bilirubin: 0.3 mg/dL (ref 0.3–1.2)
Total Protein: 5.5 g/dL — ABNORMAL LOW (ref 6.5–8.1)

## 2016-08-24 LAB — GLUCOSE, CAPILLARY
GLUCOSE-CAPILLARY: 204 mg/dL — AB (ref 65–99)
GLUCOSE-CAPILLARY: 206 mg/dL — AB (ref 65–99)
Glucose-Capillary: 143 mg/dL — ABNORMAL HIGH (ref 65–99)
Glucose-Capillary: 229 mg/dL — ABNORMAL HIGH (ref 65–99)
Glucose-Capillary: 242 mg/dL — ABNORMAL HIGH (ref 65–99)
Glucose-Capillary: 247 mg/dL — ABNORMAL HIGH (ref 65–99)

## 2016-08-24 LAB — CBC
HCT: 30.7 % — ABNORMAL LOW (ref 36.0–46.0)
Hemoglobin: 9.4 g/dL — ABNORMAL LOW (ref 12.0–15.0)
MCH: 25.9 pg — AB (ref 26.0–34.0)
MCHC: 30.6 g/dL (ref 30.0–36.0)
MCV: 84.6 fL (ref 78.0–100.0)
PLATELETS: 218 10*3/uL (ref 150–400)
RBC: 3.63 MIL/uL — AB (ref 3.87–5.11)
RDW: 14.5 % (ref 11.5–15.5)
WBC: 10.1 10*3/uL (ref 4.0–10.5)

## 2016-08-24 LAB — PROCALCITONIN: PROCALCITONIN: 0.27 ng/mL

## 2016-08-24 MED ORDER — CHLORHEXIDINE GLUCONATE 0.12 % MT SOLN
OROMUCOSAL | Status: AC
  Filled 2016-08-24: qty 15

## 2016-08-24 MED ORDER — ORAL CARE MOUTH RINSE
15.0000 mL | Freq: Two times a day (BID) | OROMUCOSAL | Status: DC
  Administered 2016-08-24 – 2016-09-03 (×22): 15 mL via OROMUCOSAL

## 2016-08-24 NOTE — Evaluation (Addendum)
Physical Therapy Evaluation Patient Details Name: Alexis Sanders MRN: [id number] DOB: 12/13/1950 Today's Date: 08/24/2016   History of Present Illness  Pt adm with acute hypoxic respiratory failure from aspiration pneumonia. Pt intubated 11/3 and extubated 11/5. PMH - Parkinsons, bipolar, DM.  Clinical Impression  Pt presents with dependencies for all mobility. Per MD office notes in Care Everywhere pt was able to stand at least in October. MD office reports falls at home when pt standing without assistance and not asking for help. With this information is does appear pt currently has had a functional decline due to this illness and hospitalization. Recommend SNF to assist pt to regain some lost mobility and decr burden of care.   Follow Up Recommendations SNF   Equipment Recommendations  Other (comment) (to be assessed)    Recommendations for Other Services       Precautions / Restrictions Precautions Precautions: Fall Restrictions Weight Bearing Restrictions: No      Mobility  Bed Mobility Overal bed mobility: Needs Assistance Bed Mobility: Supine to Sit;Sit to Supine;Rolling Rolling: Total assist   Supine to sit: +2 for physical assistance;Total assist Sit to supine: +2 for physical assistance;Total assist   General bed mobility comments: Assist for all aspect  Transfers                 General transfer comment: Did not attempt due to poor sitting balance and weakness  Ambulation/Gait                Stairs            Wheelchair Mobility    Modified Rankin (Stroke Patients Only)       Balance Overall balance assessment: Needs assistance Sitting-balance support: No upper extremity supported;Feet supported Sitting balance-Leahy Scale: Zero Sitting balance - Comments: Max A to sit EOB x 7 minutes Postural control: Posterior lean;Left lateral lean                                   Pertinent Vitals/Pain Pain Assessment:  Faces Faces Pain Scale: No hurt    Home Living Family/patient expects to be discharged to:: Private residence Living Arrangements: Other relatives               Additional Comments: Pt unable to describe home set up    Prior Function           Comments: Per MD office notes in Care Everywhere it appears pt was able to stand at least in October. MD office reports pt with falls due to trying to get up on her own.     Hand Dominance        Extremity/Trunk Assessment   Upper Extremity Assessment: RUE deficits/detail;LUE deficits/detail RUE Deficits / Details: Very minimal active movement noted     LUE Deficits / Details: Very minimal active movement noted   Lower Extremity Assessment: RLE deficits/detail;LLE deficits/detail RLE Deficits / Details: Very minimal active movement noted LLE Deficits / Details: Very minimal active movement noted     Communication   Communication: Expressive difficulties  Cognition Arousal/Alertness: Awake/alert Behavior During Therapy: Flat affect Overall Cognitive Status: No family/caregiver present to determine baseline cognitive functioning Area of Impairment: Following commands       Following Commands: Follows one step commands inconsistently            General Comments      Exercises  Assessment/Plan    PT Assessment Patient needs continued PT services  PT Problem List Decreased strength;Decreased activity tolerance;Decreased balance;Decreased mobility;Decreased cognition          PT Treatment Interventions DME instruction;Functional mobility training;Therapeutic activities;Therapeutic exercise;Balance training;Patient/family education    PT Goals (Current goals can be found in the Care Plan section)  Acute Rehab PT Goals Patient Stated Goal: Unable to state PT Goal Formulation: Patient unable to participate in goal setting Time For Goal Achievement: 09/07/16 Potential to Achieve Goals: Fair    Frequency  Min 2X/week   Barriers to discharge        Co-evaluation               End of Session Equipment Utilized During Treatment: Oxygen Activity Tolerance: Patient tolerated treatment well Patient left: in bed;with call bell/phone within reach;with nursing/sitter in room Nurse Communication: Mobility status         Time: [id number] PT Time Calculation (min) (ACUTE ONLY): 22 min   Charges:   PT Evaluation $PT Eval Moderate Complexity: 1 Procedure     PT G Codes:        Tasheba Henson 08/24/2016, 6:59 PM [facility] [phone]

## 2016-08-24 NOTE — Progress Notes (Signed)
Pharmacy Antibiotic Note  [name] is a 65 y.o. female admitted on 08/17/2016 with pneumonia.  Patient was originally started on ceftriaxone and azithromycin and now on meropenem day 7 for HACP.  -WBC=10.1, afeb, SCr= 1.61 and CrCl ~ 30  Plan:  Continue Meropenem 1gm q12hr Consider discontinuing meropenem on 11/7 for a total of 8 days?   Height: 5\' 3"  (160 cm) Weight: 147 lb 4.3 oz (66.8 kg) IBW/kg (Calculated) : 52.4  Temp (24hrs), Avg:99.6 F (37.6 C), Min:98.7 F (37.1 C), Max:101.1 F (38.4 C)   Recent Labs Lab 08/17/16 1913 08/17/16 2234 08/18/16 0142 08/18/16 0143 08/18/16 0249 08/19/16 0128  08/21/16 1700 08/22/16 0350 08/22/16 1630 08/23/16 0445 08/24/16 0330  WBC  --   --  6.5  --   --  7.2  --   --  13.0*  --  9.9 10.1  CREATININE  --   --  1.93*  --   --  1.74*  < > 2.06* 2.57* 2.26* 2.05* 1.61*  LATICACIDVEN 1.78 0.60  --  0.6 0.7  --   --   --   --   --   --   --   < > = values in this interval not displayed.  Estimated Creatinine Clearance: 32 mL/min (by C-G formula based on SCr of 1.61 mg/dL (H)).    Allergies  Allergen Reactions  . Codeine Nausea Only  . Metformin Diarrhea  . Penicillins Rash    Has patient had a PCN reaction causing immediate rash, facial/tongue/throat swelling, SOB or lightheadedness with hypotension:Yes Has patient had a PCN reaction causing severe rash involving mucus membranes or skin necrosis:unsure. Has patient had a PCN reaction that required hospitalization:No Has patient had a PCN reaction occurring within the last 10 years:No If all of the above answers are "NO", then may proceed with Cephalosporin use. Has patient had a PCN reaction causing immediate rash, faci    Antimicrobials this admission: Ceftriaxone 10/30 >> 10/31 Azithromycin 10/30 >>11/3 Primaxin 10/31>>11/3 Meropenem 11/3>  Dose adjustments this admission: N/A  Microbiology results: 10/30 BCx: neg 10/30 UCx: mult spec MRSA PCR Pos 10/31 Flu  neg  Harland GermanAndrew Kemauri Musa, Pharm D 08/24/2016 9:38 AM

## 2016-08-24 NOTE — Care Management Important Message (Signed)
Important Message  Patient Details  Name: Alexis HoleBetty S Deriso MRN: [id number] Date of Birth: 03/24/1951   Medicare Important Message Given:  Yes    Hanley HaysDowell, Kinzleigh Kandler T, RN 08/24/2016, 10:04 AM

## 2016-08-24 NOTE — Progress Notes (Signed)
Name: Alexis HoleBetty S Sanders MRN: [id number] DOB: 03/15/1951    ADMISSION DATE:  08/17/2016 CONSULTATION DATE:  08/20/2016  REFERRING MD :  Waymon AmatoHongalgi  CHIEF COMPLAINT:  Shortness of Breath  HISTORY OF PRESENT ILLNESS:   65 yo female presented to ER in with fever, confusion, increased tremor, and non productive cough.  Found to have pneumonia with concern for aspiration.  She did not have improvement with diuresis.  She had progressive respiratory distress and oxygen needs, and ultimately required intubation.  SUBJECTIVE / Interval events:  Intubated 11/3  VITAL SIGNS: BP (!) 148/61   Pulse 89   Temp 98.7 F (37.1 C) (Oral)   Resp (!) 23   Ht 5\' 3"  (1.6 m)   Wt 66.8 kg (147 lb 4.3 oz)   SpO2 97%   BMI 26.09 kg/m    INTAKE/OUTPUT: I/O last 3 completed shifts: In: 3058.5 [I.V.:88.5; NG/GT:2770; IV Piggyback:200] Out: 1260 [Urine:1260]  VENT SETTINGS:    PHYSICAL EXAMINATION: General:  Ill appearing, tremor, no distress. Extubated and doing well Neuro: follows commands, generalize weakness HEENT: [place]/AT, PERRL, EOM-I and MMM Cardiovascular: RRR, Nl S1/S2, -M/R/G. Lungs: equal chest rise. Crackles bases, no accessory use  Abdomen:  Soft, non-tender, decreased bowel sounds Musculoskeletal: No edema Skin: no rashes  CBC Recent Labs     08/22/16  0350  08/23/16  0445  08/24/16  0330  WBC  13.0*  9.9  10.1  HGB  11.0*  10.2*  9.4*  HCT  35.5*  33.4*  30.7*  PLT  263  222  218   Coag's No results for input(s): APTT, INR in the last 72 hours.  BMET Recent Labs     08/22/16  1630  08/23/16  0445  08/24/16  0330  NA  145  148*  147*  K  3.7  4.1  3.7  CL  111  112*  111  CO2  27  29  29   BUN  62*  62*  50*  CREATININE  2.26*  2.05*  1.61*  GLUCOSE  217*  222*  193*   Electrolytes Recent Labs     08/21/16  1700  08/22/16  0350  08/22/16  1630  08/23/16  0445  08/24/16  0330  CALCIUM  9.3  9.2  8.8*  8.7*  8.5*  MG  1.9  1.9  1.8   --    --   PHOS  3.0   1.9*  1.6*   --    --    Sepsis Markers Recent Labs     08/22/16  0350  08/24/16  0330  PROCALCITON  0.42  0.27    ABG Recent Labs     08/21/16  1427  PHART  7.449  PCO2ART  45.9  PO2ART  265.0*   Liver Enzymes Recent Labs     08/24/16  0330  AST  43*  ALT  55*  ALKPHOS  145*  BILITOT  0.3  ALBUMIN  1.9*   Cardiac Enzymes No results for input(s): TROPONINI, PROBNP in the last 72 hours.  Glucose Recent Labs     08/23/16  1132  08/23/16  1615  08/23/16  2029  08/23/16  2347  08/24/16  0339  08/24/16  0801  GLUCAP  182*  211*  201*  218*  143*  206*   Imaging Dg Chest Port 1 View  Result Date: 08/23/2016 CLINICAL DATA:  Acute respiratory failure, diabetes mellitus, Parkinson's EXAM: PORTABLE CHEST 1 VIEW COMPARISON:  Portable exam 0408 hours compared to 08/22/2016 FINDINGS: Tip of endotracheal tube projects 2.8 cm above carina. Feeding tube extends through stomach. LEFT jugular central venous catheter with tip projecting over SVC. Normal heart size, mediastinal contours, and pulmonary vascularity. Slightly improved RIGHT basilar infiltrate. Mild retrocardiac LEFT lower lobe infiltrate suspected, new. Upper lungs clear. No pleural effusion or pneumothorax. IMPRESSION: Improved RIGHT basilar infiltrate with new mild retrocardiac LEFT lower lobe infiltrate. Electronically Signed   By: Ulyses SouthwardMark  Boles M.D.   On: 08/23/2016 07:14   SIGNIFICANT EVENTS  [facility] given [facility] P[facility]M called 11/05 extubated 11/06 transfer to tele  STUDIES:  CT head 10/31 > negative  CULTURES: Urine 10/30 >> multiple species >>  Blood 10/30 >> Blood 10/30 >>  Respiratory viral panel 11/02 >> negative Sputum 11/03 >>  ANTIBIOTICS: Rocephin 10/30 >> 10/30 Zithromax 10/30 >> 11/03 Primaxin 11/01 >> 11/03  Meropenem 11/03 >>   LINES/TUBES: ETT 11/03 >> 11/5 IJ CVL 11/03 >>   I reviewed CXR myself, infiltrate noted.  BRIEF PATIENT  DESCRIPTION:  65 y/o female with history of Parkinson's disease developed acute hypoxic respiratory failure from aspiration pneumonia.  She has PMHx of DM2, Bipolar disorder, and Hypothyroidism.  CXR improved.  Extubated on 11/5.  ASSESSMENT / PLAN:  Acute hypoxic respiratory failure 2nd to aspiration pneumonia. intubated 11/3, stable on MV Aeration improved on CXR Passed SBT - Titrate O2 for sat of 88-92% - Swallow evaluation ordered - Mobilize as able - PT evaluation - NPO and re-assess swallowing - Cont current abx - F/U sputum culture, blood cx's  AKI 2nd to volume depletion, slowly improving Hypernatremia. Hyperchloremia  Hypokalemia. - Cont free water replacement-->adjusted  - Replace electrolytes as needed - F/U BMET and UOP - Hold lasix  Hx of DM type II, hypothyroidism. - SSI - Continue synthroid  Drug induced Parkinson's disease. Bipolar disorder. - Hold klonopin, neurontin, trazodone - Cont amantadine, abilify, bupropion, cymbalta - PT/OT eval - Unclear if she can return to her current living situation, once closer to discharge may need social work to evaluate.  Anemia of critical illness and chronic disease. - F/u CBC - Transfuse per ICU protocol.  Nutrition. Dysphagia. - Tube feeds for now.  - Will need SLP eval to assess swallowing when stronger. May need to modify dysphagia precautions further   DVT prophylaxis - Lovenox SUP - Protonix Goals of care - full code  Discussed with TRH-MD.  [facility] tele and [facility] with PCCM off 11/7.  Alyson ReedyWesam G. Jamia Hoban, M.D. [facility]. Pager: [phone]. After hours pager: [phone].

## 2016-08-25 DIAGNOSIS — E038 Other specified hypothyroidism: Secondary | ICD-10-CM

## 2016-08-25 DIAGNOSIS — F319 Bipolar disorder, unspecified: Secondary | ICD-10-CM

## 2016-08-25 DIAGNOSIS — J690 Pneumonitis due to inhalation of food and vomit: Secondary | ICD-10-CM

## 2016-08-25 LAB — BASIC METABOLIC PANEL
ANION GAP: 8 (ref 5–15)
BUN: 42 mg/dL — ABNORMAL HIGH (ref 6–20)
CALCIUM: 9.4 mg/dL (ref 8.9–10.3)
CHLORIDE: 116 mmol/L — AB (ref 101–111)
CO2: 25 mmol/L (ref 22–32)
Creatinine, Ser: 1.54 mg/dL — ABNORMAL HIGH (ref 0.44–1.00)
GFR calc non Af Amer: 34 mL/min — ABNORMAL LOW (ref >60)
GFR, EST AFRICAN AMERICAN: 40 mL/min — AB (ref >60)
Glucose, Bld: 275 mg/dL — ABNORMAL HIGH (ref 65–99)
POTASSIUM: 3.5 mmol/L (ref 3.5–5.1)
Sodium: 149 mmol/L — ABNORMAL HIGH (ref 135–145)

## 2016-08-25 LAB — CBC
HEMATOCRIT: 35.9 % — AB (ref 36.0–46.0)
HEMOGLOBIN: 11.1 g/dL — AB (ref 12.0–15.0)
MCH: 25.9 pg — ABNORMAL LOW (ref 26.0–34.0)
MCHC: 30.9 g/dL (ref 30.0–36.0)
MCV: 83.9 fL (ref 78.0–100.0)
Platelets: 296 10*3/uL (ref 150–400)
RBC: 4.28 MIL/uL (ref 3.87–5.11)
RDW: 15 % (ref 11.5–15.5)
WBC: 11.8 10*3/uL — AB (ref 4.0–10.5)

## 2016-08-25 LAB — GLUCOSE, CAPILLARY
GLUCOSE-CAPILLARY: 267 mg/dL — AB (ref 65–99)
GLUCOSE-CAPILLARY: 245 mg/dL — AB (ref 65–99)
GLUCOSE-CAPILLARY: 257 mg/dL — AB (ref 65–99)
GLUCOSE-CAPILLARY: 186 mg/dL — AB (ref 65–99)
Glucose-Capillary: 217 mg/dL — ABNORMAL HIGH (ref 65–99)

## 2016-08-25 LAB — PHOSPHORUS

## 2016-08-25 LAB — MAGNESIUM: Magnesium: 1.9 mg/dL (ref 1.7–2.4)

## 2016-08-25 MED ORDER — INSULIN ASPART 100 UNIT/ML ~~LOC~~ SOLN
0.0000 [IU] | SUBCUTANEOUS | Status: DC
  Administered 2016-08-25: 8 [IU] via SUBCUTANEOUS
  Administered 2016-08-25: 2 [IU] via SUBCUTANEOUS
  Administered 2016-08-26 (×2): 5 [IU] via SUBCUTANEOUS
  Administered 2016-08-26: 2 [IU] via SUBCUTANEOUS
  Administered 2016-08-26: 3 [IU] via SUBCUTANEOUS
  Administered 2016-08-26 (×2): 5 [IU] via SUBCUTANEOUS
  Administered 2016-08-27: 3 [IU] via SUBCUTANEOUS
  Administered 2016-08-27 (×2): 2 [IU] via SUBCUTANEOUS
  Administered 2016-08-27 – 2016-08-28 (×3): 3 [IU] via SUBCUTANEOUS
  Administered 2016-08-28: 8 [IU] via SUBCUTANEOUS
  Administered 2016-08-28 (×2): 3 [IU] via SUBCUTANEOUS
  Administered 2016-08-28 – 2016-08-29 (×2): 5 [IU] via SUBCUTANEOUS
  Administered 2016-08-29: 3 [IU] via SUBCUTANEOUS
  Administered 2016-08-29: 5 [IU] via SUBCUTANEOUS
  Administered 2016-08-29 – 2016-08-30 (×5): 3 [IU] via SUBCUTANEOUS
  Administered 2016-08-30 (×2): 2 [IU] via SUBCUTANEOUS
  Administered 2016-08-30: 3 [IU] via SUBCUTANEOUS
  Administered 2016-08-30 – 2016-08-31 (×4): 2 [IU] via SUBCUTANEOUS

## 2016-08-25 MED ORDER — POTASSIUM PHOSPHATES 15 MMOLE/5ML IV SOLN
50.0000 meq | Freq: Once | INTRAVENOUS | Status: AC
  Administered 2016-08-25: 50 meq via INTRAVENOUS
  Filled 2016-08-25: qty 11.36

## 2016-08-25 MED ORDER — INSULIN GLARGINE 100 UNIT/ML ~~LOC~~ SOLN
8.0000 [IU] | Freq: Every day | SUBCUTANEOUS | Status: DC
  Administered 2016-08-26: 8 [IU] via SUBCUTANEOUS
  Filled 2016-08-25 (×2): qty 0.08

## 2016-08-25 MED ORDER — FREE WATER
300.0000 mL | Status: DC
  Administered 2016-08-25 – 2016-08-31 (×33): 300 mL

## 2016-08-25 MED ORDER — ENOXAPARIN SODIUM 40 MG/0.4ML ~~LOC~~ SOLN
40.0000 mg | SUBCUTANEOUS | Status: DC
  Administered 2016-08-25 – 2016-08-26 (×2): 40 mg via SUBCUTANEOUS
  Filled 2016-08-25 (×2): qty 0.4

## 2016-08-25 MED ORDER — FREE WATER: 200.0000 mL | Freq: Four times a day (QID) | Status: DC

## 2016-08-25 MED ORDER — GLUCERNA 1.2 CAL PO LIQD
1000.0000 mL | ORAL | Status: DC
  Administered 2016-08-25 – 2016-08-30 (×6): 1000 mL
  Filled 2016-08-25 (×12): qty 1000

## 2016-08-25 MED ORDER — LORAZEPAM 2 MG/ML IJ SOLN
0.5000 mg | Freq: Four times a day (QID) | INTRAMUSCULAR | Status: DC | PRN
  Administered 2016-08-25: 0.5 mg via INTRAVENOUS
  Administered 2016-08-25: 1 mg via INTRAVENOUS
  Administered 2016-08-26: 0.5 mg via INTRAVENOUS
  Filled 2016-08-25 (×3): qty 1

## 2016-08-25 NOTE — Progress Notes (Addendum)
Nutrition Follow-up  DOCUMENTATION CODES:   Not applicable  INTERVENTION:  Discontinue Vital AF 1.2 formula.  Initiate Glucerna 1.2 formula @ 30 ml/hr via NGT and increase by 10 ml every 4 hours to goal rate of 60 ml/hr.   Tube feeding regimen provides 1728 kcal (100% of needs), 86 grams of protein, and 1166 ml of H2O.   Continue free water flushes of 300 ml q 4 hours.  RD to continue to monitor.   NUTRITION DIAGNOSIS:   Inadequate oral intake related to inability to eat as evidenced by NPO status; ongoing  GOAL:   Patient will meet greater than or equal to 90% of their needs; progressing  MONITOR:   TF tolerance, Labs, Weight trends, Skin, I & O's, Diet advancement  REASON FOR ASSESSMENT:   Consult Enteral/tube feeding initiation and management  ASSESSMENT:   65 y.o. female with history of Parkinson's disease started on amantadine 3 months ago, diabetes mellitus type 2, bipolar disorder, hypothyroidism brought to the ER after patient was having increasing confusion and tremors. Pt extubated 11/5.  Per SLP evaluation this AM. Pt with severe aspiration risk and recommends continuation of NPO status until able to demonstrate safer consumption of po trials. Pt currently has Vital AF 1.2 at 50 ml/hr running which has been providing 1440 kcal (85% of kcal needs), 90 grams of protein (100% of protein needs), and 972 ml of free water. Pt reports she has been tolerating her tube feeds fine. RD to adjusted tube feedings to better meet nutrition needs. RD to continue to monitor.  Nutrition-Focused physical exam completed. Findings are no fat depletion, mild muscle depletion, and mild edema.   Labs and medications reviewed. Sodium elevated at 149. Chloride elevated at 116. Phosphorous low at <1.0. CBG's 143-267 mg/dL.  Diet Order:  Diet NPO time specified  Skin:  Reviewed, no issues  Last BM:  11/6  Height:   Ht Readings from Last 1 Encounters:  08/21/16 5\' 3"  (1.6 m)     Weight:   Wt Readings from Last 1 Encounters:  08/25/16 146 lb (66.2 kg)    Ideal Body Weight:  52.3 kg  BMI:  Body mass index is 25.86 kg/m.  Estimated Nutritional Needs:   Kcal:  1700-1900  Protein:  80-90 grams  Fluid:  1.7 - 1.9 L/day  EDUCATION NEEDS:   No education needs identified at this time  Roslyn SmilingStephanie Princessa Lesmeister, MS, RD, LDN Pager # [phone] After hours/ weekend pager # [phone]

## 2016-08-25 NOTE — Evaluation (Signed)
Clinical/Bedside Swallow Evaluation Patient Details  Name: Alexis Sanders MRN: [id number] Date of Birth: 04/01/1951  Today's Date: 08/25/2016 Time: SLP Start Time (ACUTE ONLY): 1020 SLP Stop Time (ACUTE ONLY): 1045 SLP Time Calculation (min) (ACUTE ONLY): 25 min  Past Medical History:  Past Medical History:  Diagnosis Date  . Diabetes mellitus without complication (HCC)   . Hypothyroidism   . Parkinson disease [facility](HCC)    Past Surgical History:  Past Surgical History:  Procedure Laterality Date  . ABDOMINAL HYSTERECTOMY    . CHOLECYSTECTOMY    . TONSILLECTOMY     HPI:  65 year old female with a history of DM 2, hypothyroidism, CKD, bipolar disorder, and parkinsonism presentedwith 3-4 day history of increasing confusion and tremors. [facility]. She was discharged from the emergency department with prescriptions for levofloxacin, promethazine, and albuterol. Unfortunately, the patient's symptoms persisted despite taking levofloxacin. As a result, the patient was [facility]. Patient has had a nonproductive cough but no vomiting, diarrhea, dysuria. History was obtained from the patient's husband as the patient was encephalopathic. Workup in the emergency department revealed significant pyuria and chest x-ray showing bilateral consolidations, right greater than left.   Assessment / Plan / Recommendation Clinical Impression  Pt presents with significant swallow dysfunction c/b no swallow initiation and premature spillage of ice chips with coughing immediately present. Pt appears to continue to experience significant sensorimotor impairments that pt her at high aspiration risk. Given this, ST recommends pt remain NPO until she is able to demonstrate safer consumption of PO trials with ST only. ST to follow. Education provided to nursing on continued NPO status.     Aspiration Risk  Severe aspiration risk    Diet  Recommendation NPO   Medication Administration: Via alternative means    Other  Recommendations Oral Care Recommendations: Oral care QID   Follow up Recommendations  (TBD)      Frequency and Duration min 2x/week  2 weeks       Prognosis Prognosis for Safe Diet Advancement: Good Barriers to Reach Goals: Cognitive deficits      Swallow Study   General Date of Onset: 08/17/16 HPI: 65 year old female with a history of DM 2, hypothyroidism, CKD, bipolar disorder, and parkinsonism presentedwith 3-4 day history of increasing confusion and tremors. [facility]. She was discharged from the emergency department with prescriptions for levofloxacin, promethazine, and albuterol. Unfortunately, the patient's symptoms persisted despite taking levofloxacin. As a result, the patient [facility]. Patient has had a nonproductive cough but no vomiting, diarrhea, dysuria. History was obtained from the patient's husband as the patient was encephalopathic. Workup in the emergency department revealed significant pyuria and chest x-ray showing bilateral consolidations, right greater than left. Type of Study: Bedside Swallow Evaluation Previous Swallow Assessment: none in chart Diet Prior to this Study: NPO Temperature Spikes Noted: No Respiratory Status: Room air History of Recent Intubation: No Behavior/Cognition: Alert Oral Cavity Assessment: Within Functional Limits Oral Care Completed by SLP: Yes Oral Cavity - Dentition: Edentulous Vision:  (NT) Self-Feeding Abilities: Total assist Patient Positioning: Upright in bed Baseline Vocal Quality: Low vocal intensity Volitional Cough: Strong Volitional Swallow: Able to elicit    Oral/Motor/Sensory Function Overall Oral Motor/Sensory Function: Generalized oral weakness Facial ROM:  (Unable to assess d/t cognition) Facial Symmetry:  (Unable to assess d/t  cognition) Facial Strength:  (unable  to assess d/t cognition) Facial Sensation:  (unable to assess d/t cognition) Lingual ROM:  (unable to assess d/t cognition) Lingual Symmetry:  (unable to assess d/t cognition) Lingual Strength:  (unable to assess d/t cognition) Lingual Sensation:  (unable to assess d/t cognition) Mandible: Within Functional Limits   Ice Chips Ice chips: Impaired Presentation: Spoon Pharyngeal Phase Impairments: Suspected delayed Swallow;Unable to trigger swallow;Cough - Delayed   Thin Liquid Thin Liquid: Not tested    Nectar Thick Nectar Thick Liquid: Not tested   Honey Thick Honey Thick Liquid: Not tested   Puree Puree: Not tested   Solid   GO   Solid: Not tested       Alexis Sanders B. Alexis Sanders, M.S., CCC-SLP Speech-Language Pathologist [phone] Alexis Sanders 08/25/2016,11:13 AM

## 2016-08-25 NOTE — Progress Notes (Signed)
CRITICAL VALUE ALERT  Critical value received:  Phosphorus less than 1.0  Date of notification:  11/7  Time of notification:  0723  Critical value read back:Yes.    Nurse who received alert:  Silvano Bilisaitlin Holdson   MD notified (1st page):  WOODS  Time of first page:  0831  MD notified (2nd page):  Time of second page:  Responding MD:  WOODS  Time MD responded:  [phone]

## 2016-08-25 NOTE — Progress Notes (Signed)
Transferred patient via bed with monitor. [facility] to [facility]. Patient was incontinent of urine and smear of stool, patient cleansed and dried prior to transferring.

## 2016-08-25 NOTE — Progress Notes (Signed)
Inpatient Diabetes Program Recommendations  AACE/ADA: New Consensus Statement on Inpatient Glycemic Control (2015)  Target Ranges:  Prepandial:   less than 140 mg/dL      Peak postprandial:   less than 180 mg/dL (1-2 hours)      Critically ill patients:  140 - 180 mg/dL   Results for Alexis Sanders, [name] (MRN [id number]) as of 08/25/2016 10:59  Ref. Range 08/24/2016 08:01 08/24/2016 11:52 08/24/2016 16:29 08/24/2016 20:26 08/24/2016 23:31 08/25/2016 04:38 08/25/2016 07:47  Glucose-Capillary Latest Ref Range: 65 - 99 mg/dL 409206 (H) 811229 (H) 914242 (H) 247 (H) 204 (H) 267 (H) 217 (H)   Review of Glycemic Control  Diabetes history: DM2 Outpatient Diabetes medications: Glipizide XL 2.5 mg QAM Current orders for Inpatient glycemic control: Novolog 0-9 units Q4H  Inpatient Diabetes Program Recommendations: Insulin - Basal: Please consider ordering Lantus 7 units Q24H starting now. Insulin - Tube Feeding Coverage: Per chart review, patient is ordered Vital @ 50 ml/hr and is NPO. Please consider ordering Novolog 4 units Q4H for tube feeding coverage.  NOTE: Over the past 24 hours glucose has ranged from 204-267 mg/dl and patient has received a total of Novolog 23 units for correction.  Thanks, Orlando PennerMarie Gianno Volner, RN, MSN, CDE Diabetes Coordinator Inpatient Diabetes Program [phone] (Team Pager from 8am to 5pm)

## 2016-08-25 NOTE — Progress Notes (Signed)
PROGRESS NOTE    Alexis Sanders  DGU:[id number] DOB: [birth year] DOA: 08/17/2016 PCP: Vicie Mutters, PA-C   Brief Narrative:  65 y.o. WF PMHX Parkinson's disease started on amantadine 3 months ago, DM Type 2, Bipolar DO, Hypothyroidism   Brought to the ER after patient was having increasing confusion and tremors. As per the patient's husband who provided the history patient has been having these symptoms for last 3-4 days. In addition patient also has been having some nonproductive cough. Patient was taken to the ER at [facility] 2 days ago and was prescribed Levaquin for pneumonia. Since symptoms persisted and got worse patient was brought to the ER. In the ER today patient is febrile chest x-ray shows bilateral infiltrates and UA shows features consistent with UTI. On exam patient is confused, oriented to her name but follows commands and is tremulous both upper and lower extremity. Patient is being admitted for sepsis probably from pneumonia and UTI.  ED Course: Patient was given fluid bolus and started on ceftriaxone and Zithromax for pneumonia.    Subjective: 11/7  Alert, follows commands, lives arms and legs to command, intention tremor, positive dysarthria    Assessment & Plan:   Principal Problem:   Septic shock ([place]) Active Problems:   Acute encephalopathy   UTI (urinary tract infection)   ARF (acute renal failure) (HCC)   Hypothyroidism   Bipolar 1 disorder (HCC)   Parkinson's disease ([place])   Diabetes mellitus type 2 in nonobese ([place])   Acute renal failure superimposed on stage 3 chronic kidney disease (HCC)   Aspiration pneumonia (HCC)   SOB (shortness of breath)   Acute respiratory failure with hypoxia (HCC)   Aspiration pneumonia of right lower lobe due to vomit ([place])  Acute hypoxic respiratory failure 2nd to aspiration pneumonia. -intubated 11/3, stable on MV -Titrate O2 to maintain SPO2 89-93% -Swallow study pending -   AKI 2nd to volume depletion,  slowly improving Lab Results  Component Value Date   CREATININE 1.54 (H) 08/25/2016   CREATININE 1.61 (H) 08/24/2016   CREATININE 2.05 (H) 08/23/2016   Hypernatremia. -Free water 300 ml QID  Hyperchloremia   Hypokalemia. - Cont free water replacement-->adjusted  - Replace electrolytes as needed - Hold Lasix  Hypophosphatemia -Informed RN  [employer] informed that was aware of low phosphorus however have been paged to bedside of dying patient and since patient was stable would address in due time. -K-Phos 50 mEq 1 -  Hx of DM type II Uncontrolled with complications, hypothyroidism. -Lantus 8 units daily -Moderate SSI  Hypothyroidism -Synthroid 125 g daily -TSH WNL  Drug induced Parkinson's disease. -  Bipolar disorder/Anxiety. - Hold klonopin, neurontin, trazodone - Amantadine100 mg BID -Bupropion 100 mg daily -Cymbalta 30 mg BID - PT/OT eval pending - Unclear if she can return to her current living situation, once closer to discharge may need social work to evaluate.  Anemia of critical illness and chronic disease. - F/u CBC - Transfuse for hemoglobin<8  Nutrition. -Vital AF 1.2 CAL 14m/hr  Dysphagia. - Tube feeds for now.       DVT prophylaxis: Lovenox Code Status: Full Family Communication: None Disposition Plan: ??  Goals of care -Palliative Care consult placed. Address CODE STATUS, short-term vs long-term goals of care   Consultants:  [facility]    Procedures/Significant Events:  [facility]  > negative 11/02 PCCM called 11/4 Echocardiogram: LVEF =65%  to 70%. -(grade 1 diastolic dysfunction). 11/05 extubated 11/06 transfer to tele   Cultures 10/30 Urine positive multiple species   10/30 Blood right forearm/wrist negative 10/31 Blood left hand/forearm negative   11/2 Respiratory viral panel negative    Antimicrobials: Rocephin 10/30 >>  10/30 Zithromax 10/30 >> 11/03 Primaxin 11/01 >> 11/03  Meropenem 11/03 >>    Devices    LINES / TUBES:  ETT 11/03 >> 11/5 IJ CVL 11/03 >>  removed???    Continuous Infusions: . feeding supplement (GLUCERNA 1.2 CAL) 1,000 mL (08/25/16 1525)     Objective: Vitals:   08/25/16 1000 08/25/16 1300 08/25/16 1732 08/25/16 2004  BP: (!) 149/54 (!) 151/68 (!) 157/56   Pulse: 93 (!) 105 (!) 110   Resp: 18 (!) 30 20   Temp: 98.4 F (36.9 C) 99.2 F (37.3 C) 98.8 F (37.1 C) (!) 103.1 F (39.5 C)  TempSrc: Oral Oral Oral Axillary  SpO2: 98% 95% 96%   Weight:      Height:        Intake/Output Summary (Last 24 hours) at 08/25/16 2041 Last data filed at 08/25/16 1700  Gross per 24 hour  Intake              500 ml  Output                0 ml  Net              500 ml   Filed Weights   08/24/16 0000 08/24/16 0322 08/25/16 0258  Weight: 66.8 kg (147 lb 4.3 oz) 66.8 kg (147 lb 4.3 oz) 66.2 kg (146 lb)    Examination:  General: Alert, follows commands his arms and legs to command, No acute respiratory distress Eyes: negative scleral hemorrhage, negative anisocoria, negative icterus ENT: Negative Runny nose, negative gingival bleeding, Neck:  Negative scars, masses, torticollis, lymphadenopathy, JVD Lungs: Clear to auscultation bilaterally without wheezes or crackles Cardiovascular: Regular rate and rhythm without murmur gallop or rub normal S1 and S2 Abdomen: negative abdominal pain, nondistended, positive soft, bowel sounds, no rebound, no ascites, no appreciable mass Extremities: No significant cyanosis, clubbing, or edema bilateral lower extremities Skin: Negative rashes, lesions, ulcers Psychiatric:  Negative depression, negative anxiety, negative fatigue, negative mania  Central nervous system:  Patient alert, intention tremor, follows commands by lifting arms and legs and command, positive dysarthria, positive expressive aphasia, negative receptive aphasia.  .      Data Reviewed: Care during the described time interval was provided by me .  I have reviewed this patient's available data, including medical history, events of note, physical examination, and all test results as part of my evaluation. I have personally reviewed and interpreted all radiology studies.  CBC:  Recent Labs Lab 08/19/16 0128 08/22/16 0350 08/23/16 0445 08/24/16 0330 08/25/16 0604  WBC 7.2 13.0* 9.9 10.1 11.8*  HGB 9.1* 11.0* 10.2* 9.4* 11.1*  HCT 28.9* 35.5* 33.4* 30.7* 35.9*  MCV 83.5 84.9 84.8 84.6 83.9  PLT 183 263 222 218 623   Basic Metabolic Panel:  Recent Labs Lab 08/21/16 1300 08/21/16 1700 08/22/16 0350 08/22/16 1630 08/23/16 0445 08/24/16 0330 08/25/16 0604  NA  --  149* 149* 145 148* 147* 149*  K  --  2.9* 3.8 3.7 4.1 3.7 3.5  CL  --  109 112* 111 112* 111 116*  CO2  --  _0 GLUCOSE  --  180* 212* 217* 222* 193* 275*  BUN  --  40* 56* 62* 62* 50* 42*  CREATININE  --  2.06* 2.57* 2.26* 2.05* 1.61* 1.54*  CALCIUM  --  9.3 9.2 8.8* 8.7* 8.5* 9.4  MG 2.0 1.9 1.9 1.8  --   --  1.9  PHOS 4.3 3.0 1.9* 1.6*  --   --  <1.0*   GFR: Estimated Creatinine Clearance: 33.3 mL/min (by C-G formula based on SCr of 1.54 mg/dL (H)). Liver Function Tests:  Recent Labs Lab 08/24/16 0330  AST 43*  ALT 55*  ALKPHOS 145*  BILITOT 0.3  PROT 5.5*  ALBUMIN 1.9*   No results for input(s): LIPASE, AMYLASE in the last 168 hours. No results for input(s): AMMONIA in the last 168 hours. Coagulation Profile: No results for input(s): INR, PROTIME in the last 168 hours. Cardiac Enzymes: No results for input(s): CKTOTAL, CKMB, CKMBINDEX, TROPONINI in the last 168 hours. BNP (last 3 results) No results for input(s): PROBNP in the last 8760 hours. HbA1C: No results for input(s): HGBA1C in the last 72 hours. CBG:  Recent Labs Lab 08/25/16 0438 08/25/16 0747 08/25/16 1202 08/25/16 1601 08/25/16 1959  GLUCAP 267* 217* 245* 257* 186*   Lipid  Profile: No results for input(s): CHOL, HDL, LDLCALC, TRIG, CHOLHDL, LDLDIRECT in the last 72 hours. Thyroid Function Tests: No results for input(s): TSH, T4TOTAL, FREET4, T3FREE, THYROIDAB in the last 72 hours. Anemia Panel: No results for input(s): VITAMINB12, FOLATE, FERRITIN, TIBC, IRON, RETICCTPCT in the last 72 hours. Urine analysis:    Component Value Date/Time   COLORURINE YELLOW 08/17/2016 1937   APPEARANCEUR CLOUDY (A) 08/17/2016 1937   LABSPEC 1.012 08/17/2016 1937   PHURINE 5.5 08/17/2016 1937   GLUCOSEU NEGATIVE 08/17/2016 1937   HGBUR NEGATIVE 08/17/2016 1937   BILIRUBINUR NEGATIVE 08/17/2016 1937   KETONESUR 15 (A) 08/17/2016 1937   PROTEINUR NEGATIVE 08/17/2016 1937   NITRITE NEGATIVE 08/17/2016 1937   LEUKOCYTESUR LARGE (A) 08/17/2016 1937   Sepsis Labs: _0 (procalcitonin:4,lacticidven:4)  ) Recent Results (from the past 240 hour(s))  Blood Culture (routine x 2)     Status: None   Collection Time: 08/17/16  7:00 PM  Result Value Ref Range Status   Specimen Description BLOOD RIGHT FOREARM  Final   Special Requests BOTTLES DRAWN AEROBIC AND ANAEROBIC 5CC  Final   Culture NO GROWTH 5 DAYS  Final   Report Status 08/22/2016 FINAL  Final  Blood Culture (routine x 2)     Status: None   Collection Time: 08/17/16  7:17 PM  Result Value Ref Range Status   Specimen Description BLOOD RIGHT WRIST  Final   Special Requests BOTTLES DRAWN AEROBIC AND ANAEROBIC 5CC  Final   Culture NO GROWTH 5 DAYS  Final   Report Status 08/22/2016 FINAL  Final  Urine culture     Status: Abnormal   Collection Time: 08/17/16  7:38 PM  Result Value Ref Range Status   Specimen Description URINE, RANDOM  Final   Special Requests NONE  Final   Culture MULTIPLE SPECIES PRESENT, SUGGEST RECOLLECTION (A)  Final   Report Status 08/19/2016 FINAL  Final  Culture, blood (routine x 2) Call MD if unable to obtain prior to antibiotics being given     Status: None   Collection Time: 08/18/16   1:15 AM  Result Value Ref Range Status   Specimen Description BLOOD LEFT HAND  Final   Special Requests BOTTLES DRAWN AEROBIC AND ANAEROBIC 5ML  Final   Culture NO GROWTH 5 DAYS  Final  Report Status 08/23/2016 FINAL  Final  MRSA PCR Screening     Status: Abnormal   Collection Time: 08/18/16  2:15 AM  Result Value Ref Range Status   MRSA by PCR POSITIVE (A) NEGATIVE Final    Comment:        The GeneXpert MRSA Assay (FDA approved for NASAL specimens only), is one component of a comprehensive MRSA colonization surveillance program. It is not intended to diagnose MRSA infection nor to guide or monitor treatment for MRSA infections. RESULT CALLED TO, READ BACK BY AND VERIFIED WITH: C WOODARD,RN _0  08/18/16 MKELLY,MLT   Culture, blood (routine x 2) Call MD if unable to obtain prior to antibiotics being given     Status: None   Collection Time: 08/18/16  2:49 AM  Result Value Ref Range Status   Specimen Description BLOOD BLOOD LEFT FOREARM  Final   Special Requests BOTTLES DRAWN AEROBIC AND ANAEROBIC 5CC  Final   Culture NO GROWTH 5 DAYS  Final   Report Status 08/23/2016 FINAL  Final  Respiratory Panel by PCR     Status: None   Collection Time: 08/20/16  1:27 PM  Result Value Ref Range Status   Adenovirus NOT DETECTED NOT DETECTED Final   Coronavirus 229E NOT DETECTED NOT DETECTED Final   Coronavirus HKU1 NOT DETECTED NOT DETECTED Final   Coronavirus NL63 NOT DETECTED NOT DETECTED Final   Coronavirus OC43 NOT DETECTED NOT DETECTED Final   Metapneumovirus NOT DETECTED NOT DETECTED Final   Rhinovirus / Enterovirus NOT DETECTED NOT DETECTED Final   Influenza A NOT DETECTED NOT DETECTED Final   Influenza B NOT DETECTED NOT DETECTED Final   Parainfluenza Virus 1 NOT DETECTED NOT DETECTED Final   Parainfluenza Virus 2 NOT DETECTED NOT DETECTED Final   Parainfluenza Virus 3 NOT DETECTED NOT DETECTED Final   Parainfluenza Virus 4 NOT DETECTED NOT DETECTED Final   Respiratory  Syncytial Virus NOT DETECTED NOT DETECTED Final   Bordetella pertussis NOT DETECTED NOT DETECTED Final   Chlamydophila pneumoniae NOT DETECTED NOT DETECTED Final   Mycoplasma pneumoniae NOT DETECTED NOT DETECTED Final         Radiology Studies: No results found.      Scheduled Meds: . amantadine  100 mg Per Tube BID  . ARIPiprazole  10 mg Per Tube QPM  . buPROPion  100 mg Per Tube Daily  . chlorhexidine gluconate (MEDLINE KIT)  15 mL Mouth Rinse BID  . DULoxetine  30 mg Oral BID  . enoxaparin (LOVENOX) injection  40 mg Subcutaneous Q24H  . free water  300 mL Per Tube Q4H  . insulin aspart  0-15 Units Subcutaneous Q4H  . insulin glargine  8 Units Subcutaneous QHS  . levothyroxine  125 mcg Per Tube QAC breakfast  . mouth rinse  15 mL Mouth Rinse BID  . meropenem (MERREM) IV  1 g Intravenous Q12H  . sodium chloride flush  10-40 mL Intracatheter Q12H   Continuous Infusions: . feeding supplement (GLUCERNA 1.2 CAL) 1,000 mL (08/25/16 1525)     LOS: 8 days    Time spent: 40 minutes    Naturi Alarid, Geraldo Docker, MD [facility] Pager [phone]   If 7PM-7AM, please contact night-coverage [email or web address] Password [facility] 08/25/2016, 8:41 PM

## 2016-08-25 NOTE — Progress Notes (Signed)
3 Rings removed and sent home with son

## 2016-08-26 ENCOUNTER — Inpatient Hospital Stay (HOSPITAL_COMMUNITY): Payer: Medicare Other

## 2016-08-26 LAB — COMPREHENSIVE METABOLIC PANEL
ALBUMIN: 2.5 g/dL — AB (ref 3.5–5.0)
ALT: 39 U/L (ref 14–54)
ANION GAP: 11 (ref 5–15)
AST: 34 U/L (ref 15–41)
Alkaline Phosphatase: 129 U/L — ABNORMAL HIGH (ref 38–126)
BUN: 34 mg/dL — ABNORMAL HIGH (ref 6–20)
CO2: 23 mmol/L (ref 22–32)
Calcium: 8.7 mg/dL — ABNORMAL LOW (ref 8.9–10.3)
Chloride: 120 mmol/L — ABNORMAL HIGH (ref 101–111)
Creatinine, Ser: 1.71 mg/dL — ABNORMAL HIGH (ref 0.44–1.00)
GFR calc Af Amer: 35 mL/min — ABNORMAL LOW (ref >60)
GFR calc non Af Amer: 30 mL/min — ABNORMAL LOW (ref >60)
GLUCOSE: 135 mg/dL — AB (ref 65–99)
POTASSIUM: 3.8 mmol/L (ref 3.5–5.1)
SODIUM: 154 mmol/L — AB (ref 135–145)
TOTAL PROTEIN: 6.1 g/dL — AB (ref 6.5–8.1)
Total Bilirubin: 0.5 mg/dL (ref 0.3–1.2)

## 2016-08-26 LAB — CBC
HEMATOCRIT: 33.2 % — AB (ref 36.0–46.0)
HEMOGLOBIN: 10.3 g/dL — AB (ref 12.0–15.0)
MCH: 26.3 pg (ref 26.0–34.0)
MCHC: 31 g/dL (ref 30.0–36.0)
MCV: 84.9 fL (ref 78.0–100.0)
Platelets: 280 10*3/uL (ref 150–400)
RBC: 3.91 MIL/uL (ref 3.87–5.11)
RDW: 15.5 % (ref 11.5–15.5)
WBC: 12.3 10*3/uL — ABNORMAL HIGH (ref 4.0–10.5)

## 2016-08-26 LAB — URINALYSIS, ROUTINE W REFLEX MICROSCOPIC
Bilirubin Urine: NEGATIVE
Glucose, UA: NEGATIVE mg/dL
HGB URINE DIPSTICK: NEGATIVE
Ketones, ur: NEGATIVE mg/dL
Leukocytes, UA: NEGATIVE
Nitrite: NEGATIVE
Protein, ur: NEGATIVE mg/dL
SPECIFIC GRAVITY, URINE: 1.012 (ref 1.005–1.030)
pH: 8 (ref 5.0–8.0)

## 2016-08-26 LAB — GLUCOSE, CAPILLARY
GLUCOSE-CAPILLARY: 142 mg/dL — AB (ref 65–99)
GLUCOSE-CAPILLARY: 148 mg/dL — AB (ref 65–99)
GLUCOSE-CAPILLARY: 212 mg/dL — AB (ref 65–99)
GLUCOSE-CAPILLARY: 221 mg/dL — AB (ref 65–99)
Glucose-Capillary: 227 mg/dL — ABNORMAL HIGH (ref 65–99)
Glucose-Capillary: 234 mg/dL — ABNORMAL HIGH (ref 65–99)
Glucose-Capillary: 201 mg/dL — ABNORMAL HIGH (ref 65–99)

## 2016-08-26 LAB — PHOSPHORUS: Phosphorus: 2.4 mg/dL — ABNORMAL LOW (ref 2.5–4.6)

## 2016-08-26 MED ORDER — CLONAZEPAM 0.5 MG PO TABS
0.2500 mg | ORAL_TABLET | Freq: Two times a day (BID) | ORAL | Status: DC
  Administered 2016-08-26 – 2016-08-30 (×10): 0.25 mg
  Filled 2016-08-26 (×10): qty 1

## 2016-08-26 MED ORDER — ACETAMINOPHEN 160 MG/5ML PO SOLN
650.0000 mg | ORAL | Status: DC | PRN
  Administered 2016-08-26 (×2): 650 mg
  Filled 2016-08-26 (×2): qty 20.3

## 2016-08-26 MED ORDER — CLONAZEPAM 1 MG PO TABS: 1.0000 mg | ORAL_TABLET | Freq: Two times a day (BID) | ORAL | Status: DC

## 2016-08-26 MED ORDER — SODIUM CHLORIDE 0.45 % IV SOLN
INTRAVENOUS | Status: DC
  Administered 2016-08-26 – 2016-09-04 (×10): via INTRAVENOUS

## 2016-08-26 MED ORDER — GABAPENTIN 300 MG PO CAPS: 600.0000 mg | ORAL_CAPSULE | Freq: Two times a day (BID) | ORAL | Status: DC

## 2016-08-26 MED ORDER — VANCOMYCIN HCL IN DEXTROSE 1-5 GM/200ML-% IV SOLN
1000.0000 mg | INTRAVENOUS | Status: DC
  Administered 2016-08-26 – 2016-08-29 (×4): 1000 mg via INTRAVENOUS
  Filled 2016-08-26 (×5): qty 200

## 2016-08-26 MED ORDER — INSULIN GLARGINE 100 UNIT/ML ~~LOC~~ SOLN
12.0000 [IU] | Freq: Every day | SUBCUTANEOUS | Status: DC
  Administered 2016-08-26 – 2016-09-03 (×9): 12 [IU] via SUBCUTANEOUS
  Filled 2016-08-26 (×10): qty 0.12

## 2016-08-26 MED ORDER — GABAPENTIN 250 MG/5ML PO SOLN
200.0000 mg | Freq: Two times a day (BID) | ORAL | Status: DC
  Administered 2016-08-26 – 2016-08-30 (×10): 200 mg
  Filled 2016-08-26 (×11): qty 4

## 2016-08-26 NOTE — Progress Notes (Signed)
Alexis Sanders - Stepdown/ICU TEAM  Alexis Sanders  INO:[id number] DOB: January 13, 1951 DOA: 08/17/2016 PCP: Vicie Mutters, PA-C    Brief Narrative:  65 y.o.F HX Parkinson's disease started on amantadine August 2017, DM2, Bipolar DO, and Hypothyroidism who was brought to the ER reporting confusion and tremors for 3-4 days. In addition patient also has been having some nonproductive cough.   In the ER patient was febrile w/ chest x-ray showing bilateral infiltrates and UA consistent with UTI.   Significant Events: 10/29 seen at [facility] > given levaquin 10/30 admitted [facility] 11/02 PCCM called 11/05 extubated 11/06 transfer to tele  Subjective: Having recurrent fevers today despite ongoing abx tx.  The patient will open her eyes to the examiner and follow me around the room but does not respond to questions and is unable to provide a review of systems.  Assessment & Plan:  FUO Fevers have recurred today in excess of 103 - no clear source - previous central venous line puncture site is unremarkable on exam - the patient does not have a urinary catheter - she is very broadly covered presently with meropenem and has been on broad-spectrum antibiotics since October 30 - I will discontinue her antibiotics at this time and follow her clinically - the only potential addition to her broad coverage would be vancomycin and if she remains febrile through the day I will initiate vancomycin and follow for the possibility of an MRSA pneumonia or less likely UTI  Sepsis due to Bibasilar aspiration PNA +/- UTI  Urine culture not helpful and dirty UA may have just been unclean collection - has been adequately covered for aspiration pneumonia for greater than 7 days  Acute hypoxic respiratory failure 2nd to aspiration pneumonia intubated 11/3 - failed swallow eval - saturations stable on room air at this time  AKI 2nd to volume depletion - improving  Recent Labs Lab 08/22/16 0350 08/22/16 1630  08/23/16 0445 08/24/16 0330 08/25/16 0604  CREATININE 2.57* 2.26* 2.05* Sanders.61* Sanders.54*    Hypernatremia Remains volume depleted - cont to hydrate and follow  Hypokalemia Cont to supplement to goal of 4.0  Hypophosphatemia Replaced yesterday - recheck level today and follow  DM2 Poorly controlled - adjust tx and follow - A1c 6.5  Hypothyroidism Cont Synthroid 125 g daily - TSH at goal   Drug induced Parkinson's disease -cont home medical tx   Bipolar disorder/Anxiety  Anemia of critical illness and chronic disease  Dysphagia / Nutrition -Tube feeds for now  MRSA screen +  DVT prophylaxis: lovenox  Code Status: FULL CODE Family Communication: no family present at time of exam  Disposition Plan:   Consultants:  PCCM Palliative Care   Antimicrobials:  Rocephin 10/30 Zithromax 10/30 > 11/02 Primaxin 10/31 >11/02  Meropenem 11/03 >11/8  Objective: Blood pressure (!) 142/58, pulse (!) 107, temperature (!) 101.6 F (38.7 C), temperature source Oral, resp. rate (!) 36, height _0  (Sanders.6 m), weight 67.Sanders kg (148 lb), SpO2 95 %.  Intake/Output Summary (Last 24 hours) at 08/26/16 1042 Last data filed at 08/26/16 0900  Gross per 24 hour  Intake                0 ml  Output                0 ml  Net                0 ml   Filed Weights   08/24/16 0322 08/25/16 0258  08/26/16 0425  Weight: 66.8 kg (147 lb 4.3 oz) 66.2 kg (146 lb) 67.Sanders kg (148 lb)    Examination: General: No acute respiratory distress evident - sedate/lethargic  Lungs: Poor air movement bilateral bases with mild crackles with no wheezing Cardiovascular: Tachycardic but regular with no appreciable murmur gallop or rub Abdomen: Nondistended, soft, bowel sounds positive, no rebound, no ascites, no appreciable mass Extremities: No significant cyanosis, clubbing, or edema bilateral lower extremities  CBC:  Recent Labs Lab 08/22/16 0350 08/23/16 0445 08/24/16 0330 08/25/16 0604  WBC 13.0* 9.9  10.Sanders 11.8*  HGB 11.0* 10.2* 9.4* 11.Sanders*  HCT 35.5* 33.4* 30.7* 35.9*  MCV 84.9 84.8 84.6 83.9  PLT 263 222 218 170   Basic Metabolic Panel:  Recent Labs Lab 08/21/16 1300 08/21/16 1700 08/22/16 0350 08/22/16 1630 08/23/16 0445 08/24/16 0330 08/25/16 0604  NA  --  149* 149* 145 148* 147* 149*  K  --  2.9* 3.8 3.7 4.Sanders 3.7 3.5  CL  --  109 112* 111 112* 111 116*  CO2  --  _0 GLUCOSE  --  180* 212* 217* 222* 193* 275*  BUN  --  40* 56* 62* 62* 50* 42*  CREATININE  --  2.06* 2.57* 2.26* 2.05* Sanders.61* Sanders.54*  CALCIUM  --  9.3 9.2 8.8* 8.7* 8.5* 9.4  MG 2.0 Sanders.9 Sanders.9 Sanders.8  --   --  Sanders.9  PHOS 4.3 3.0 Sanders.9* Sanders.6*  --   --  <Sanders.0*   GFR: Estimated Creatinine Clearance: 33.5 mL/min (by C-G formula based on SCr of Sanders.54 mg/dL (H)).  Liver Function Tests:  Recent Labs Lab 08/24/16 0330  AST 43*  ALT 55*  ALKPHOS 145*  BILITOT 0.3  PROT 5.5*  ALBUMIN Sanders.9*    HbA1C: Hgb A1c MFr Bld  Date/Time Value Ref Range Status  08/18/2016 11:14 AM 6.5 (H) 4.8 - 5.6 % Final    Comment:    (NOTE)         Pre-diabetes: 5.7 - 6.4         Diabetes: >6.4         Glycemic control for adults with diabetes: <7.0     CBG:  Recent Labs Lab 08/25/16 1601 08/25/16 1959 08/26/16 0029 08/26/16 0550 08/26/16 0737  GLUCAP 257* 186* 227* 221* 234*    Recent Results (from the past 240 hour(s))  Blood Culture (routine x 2)     Status: None   Collection Time: 08/17/16  7:00 PM  Result Value Ref Range Status   Specimen Description BLOOD RIGHT FOREARM  Final   Special Requests BOTTLES DRAWN AEROBIC AND ANAEROBIC 5CC  Final   Culture NO GROWTH 5 DAYS  Final   Report Status 08/22/2016 FINAL  Final  Blood Culture (routine x 2)     Status: None   Collection Time: 08/17/16  7:17 PM  Result Value Ref Range Status   Specimen Description BLOOD RIGHT WRIST  Final   Special Requests BOTTLES DRAWN AEROBIC AND ANAEROBIC 5CC  Final   Culture NO GROWTH 5 DAYS  Final   Report Status 08/22/2016  FINAL  Final  Urine culture     Status: Abnormal   Collection Time: 08/17/16  7:38 PM  Result Value Ref Range Status   Specimen Description URINE, RANDOM  Final   Special Requests NONE  Final   Culture MULTIPLE SPECIES PRESENT, SUGGEST RECOLLECTION (A)  Final   Report Status 08/19/2016 FINAL  Final  Culture, blood (  routine x 2) Call MD if unable to obtain prior to antibiotics being given     Status: None   Collection Time: 08/18/16  Sanders:15 AM  Result Value Ref Range Status   Specimen Description BLOOD LEFT HAND  Final   Special Requests BOTTLES DRAWN AEROBIC AND ANAEROBIC 5ML  Final   Culture NO GROWTH 5 DAYS  Final   Report Status 08/23/2016 FINAL  Final  MRSA PCR Screening     Status: Abnormal   Collection Time: 08/18/16  2:15 AM  Result Value Ref Range Status   MRSA by PCR POSITIVE (A) NEGATIVE Final    Comment:        The GeneXpert MRSA Assay (FDA approved for NASAL specimens only), is one component of a comprehensive MRSA colonization surveillance program. It is not intended to diagnose MRSA infection nor to guide or monitor treatment for MRSA infections. RESULT CALLED TO, READ BACK BY AND VERIFIED WITH: C WOODARD,RN _0  08/18/16 MKELLY,MLT   Culture, blood (routine x 2) Call MD if unable to obtain prior to antibiotics being given     Status: None   Collection Time: 08/18/16  2:49 AM  Result Value Ref Range Status   Specimen Description BLOOD BLOOD LEFT FOREARM  Final   Special Requests BOTTLES DRAWN AEROBIC AND ANAEROBIC 5CC  Final   Culture NO GROWTH 5 DAYS  Final   Report Status 08/23/2016 FINAL  Final  Respiratory Panel by PCR     Status: None   Collection Time: 08/20/16  Sanders:27 PM  Result Value Ref Range Status   Adenovirus NOT DETECTED NOT DETECTED Final   Coronavirus 229E NOT DETECTED NOT DETECTED Final   Coronavirus HKU1 NOT DETECTED NOT DETECTED Final   Coronavirus NL63 NOT DETECTED NOT DETECTED Final   Coronavirus OC43 NOT DETECTED NOT DETECTED Final    Metapneumovirus NOT DETECTED NOT DETECTED Final   Rhinovirus / Enterovirus NOT DETECTED NOT DETECTED Final   Influenza A NOT DETECTED NOT DETECTED Final   Influenza B NOT DETECTED NOT DETECTED Final   Parainfluenza Virus Sanders NOT DETECTED NOT DETECTED Final   Parainfluenza Virus 2 NOT DETECTED NOT DETECTED Final   Parainfluenza Virus 3 NOT DETECTED NOT DETECTED Final   Parainfluenza Virus 4 NOT DETECTED NOT DETECTED Final   Respiratory Syncytial Virus NOT DETECTED NOT DETECTED Final   Bordetella pertussis NOT DETECTED NOT DETECTED Final   Chlamydophila pneumoniae NOT DETECTED NOT DETECTED Final   Mycoplasma pneumoniae NOT DETECTED NOT DETECTED Final     Scheduled Meds: . amantadine  100 mg Per Tube BID  . ARIPiprazole  10 mg Per Tube QPM  . buPROPion  100 mg Per Tube Daily  . chlorhexidine gluconate (MEDLINE KIT)  15 mL Mouth Rinse BID  . DULoxetine  30 mg Oral BID  . enoxaparin (LOVENOX) injection  40 mg Subcutaneous Q24H  . free water  300 mL Per Tube Q4H  . insulin aspart  0-15 Units Subcutaneous Q4H  . insulin glargine  8 Units Subcutaneous QHS  . levothyroxine  125 mcg Per Tube QAC breakfast  . mouth rinse  15 mL Mouth Rinse BID  . meropenem (MERREM) IV  Sanders g Intravenous Q12H  . sodium chloride flush  10-40 mL Intracatheter Q12H     LOS: 9 days   Cherene Altes, MD [facility] Office  [phone] Pager - Text Page per Shea Evans as per below:  On-Call/Text Page:      [email or web address]      password [facility]  If 7PM-7AM, please contact night-coverage [email or web address] Password [facility] 08/26/2016, 10:42 AM

## 2016-08-26 NOTE — Consult Note (Signed)
Consultation Note Date: 08/26/2016   Patient Name: Alexis Sanders  DOB: 08/10/1951  MRN: [id number]  Age / Sex: 65 y.o., female  PCP: Vicie Mutters, PA-C Referring Physician: Cherene Altes, MD  Reason for Consultation: Establishing goals of care  HPI/Patient Profile: 65 y.o. female  with past medical history of Parkinson's disease, diabetes mellitus type II, bipolar disorder, hypothyroidism admitted on 08/17/2016 with increased confusion and tremors. She was treated for pneumonia and UTI and required transient intubation 11/3-11/5 but continues to have fever of unknown origin as well as severe dysphagia.   Clinical Assessment and Goals of Care: I met today with Alexis Sanders and her aide, Crystal, at bedside. Alexis Sanders is red, sweating, and with severe tremor. She does answer some simple questions or does appear to be attempting to communicate. Crystal provides list of her medications that matches with our list of her home medications on file.   She gives some insight into Alexis Sanders baseline which she says is mostly wheelchair, help with bathing, and requires assistance with feeding s/t her tremor and dropping food. Crystal says that her tremor is much more severe now than it was at home. No other visitors at bedside and no family.   Per notes she is legally separated from her husband and has an adult son and daughter. Daughter in rehab facility with her own health concerns. Will attempt to engage husband and son in [place] conversation to specifically address concerns with ongoing fever/infection as well as dysphagia.   Primary Decision Maker NEXT OF KIN legally separated husband vs adult children    SUMMARY OF RECOMMENDATIONS   - Will discuss [place] further with husband and children  Code Status/Advance Care Planning:  Full code   Symptom Management:   Tremor: Continue medications as recommended per  primary.   Dysphagia: SLP following.   Palliative Prophylaxis:   Bowel Regimen, Delirium Protocol, Oral Care and Turn Reposition  Additional Recommendations (Limitations, Scope, Preferences):  Full Scope Treatment  Psycho-social/Spiritual:   Desire for further Chaplaincy support:no  Additional Recommendations: Caregiving  Support/Resources  Prognosis:   Unable to determine  Discharge Planning: To Be Determined      Primary Diagnoses: Present on Admission: . Septic shock ([place]) . Acute encephalopathy . UTI (urinary tract infection) . ARF (acute renal failure) ([place]) . Hypothyroidism . Bipolar 1 disorder ([place]) . Parkinson's disease ([place])   I have reviewed the medical record, interviewed the patient and family, and examined the patient. The following aspects are pertinent.  Past Medical History:  Diagnosis Date  . Diabetes mellitus without complication ([place])   . Hypothyroidism   . Parkinson disease [facility])    Social History   Social History  . Marital status: Married    Spouse name: N/A  . Number of children: N/A  . Years of education: N/A   Social History Main Topics  . Smoking status: Former Research scientist (life sciences)  . Smokeless tobacco: Never Used  . Alcohol use No  . Drug use: No  . Sexual activity: Not  Asked   Other Topics Concern  . None   Social History Narrative  . None   Family History  Problem Relation Age of Onset  . Parkinson's disease Neg Hx    Scheduled Meds: . amantadine  100 mg Per Tube BID  . ARIPiprazole  10 mg Per Tube QPM  . buPROPion  100 mg Per Tube Daily  . chlorhexidine gluconate (MEDLINE KIT)  15 mL Mouth Rinse BID  . clonazePAM  0.25 mg Per Tube BID  . enoxaparin (LOVENOX) injection  40 mg Subcutaneous Q24H  . free water  300 mL Per Tube Q4H  . gabapentin  200 mg Per Tube BID  . insulin aspart  0-15 Units Subcutaneous Q4H  . insulin glargine  12 Units Subcutaneous QHS  . levothyroxine  125 mcg Per Tube QAC breakfast  . mouth rinse  15  mL Mouth Rinse BID  . sodium chloride flush  10-40 mL Intracatheter Q12H   Continuous Infusions: . sodium chloride 75 mL/hr at 08/26/16 1329  . feeding supplement (GLUCERNA 1.2 CAL) 1,000 mL (08/26/16 1028)   PRN Meds:.acetaminophen (TYLENOL) oral liquid 160 mg/5 mL, albuterol, bisacodyl, docusate, LORazepam, [DISCONTINUED] ondansetron **OR** ondansetron (ZOFRAN) IV Allergies  Allergen Reactions  . Codeine Nausea Only  . Metformin Diarrhea  . Penicillins Rash    Has patient had a PCN reaction causing immediate rash, facial/tongue/throat swelling, SOB or lightheadedness with hypotension:Yes Has patient had a PCN reaction causing severe rash involving mucus membranes or skin necrosis:unsure. Has patient had a PCN reaction that required hospitalization:No Has patient had a PCN reaction occurring within the last 10 years:No If all of the above answers are "NO", then may proceed with Cephalosporin use. Has patient had a PCN reaction causing immediate rash, faci   Review of Systems  Unable to perform ROS: Mental status change    Physical Exam  Constitutional: She appears well-developed.  HENT:  Head: Normocephalic and atraumatic.  Cardiovascular: Normal rate.   Pulmonary/Chest: Effort normal. No accessory muscle usage. No tachypnea. No respiratory distress.  Abdominal: Soft. Normal appearance.  Neurological: She is alert. She is disoriented.  Skin: Skin is warm. She is diaphoretic.  Nursing note and vitals reviewed.   Vital Signs: BP (!) 142/58 (BP Location: Left Arm)   Pulse (!) 107   Temp (!) 100.4 F (38 C) (Axillary) Comment: Tylenol given   Resp (!) 36   Ht '5\' 3"'$  (1.6 m)   Wt 67.1 kg (148 lb)   SpO2 95%   BMI 26.22 kg/m  Pain Assessment: PAINAD   Pain Score: 0-No pain   SpO2: SpO2: 95 % O2 Device:SpO2: 95 % O2 Flow Rate: .O2 Flow Rate (L/min): 1 L/min  IO: Intake/output summary:  Intake/Output Summary (Last 24 hours) at 08/26/16 1533 Last data filed at 08/26/16  0900  Gross per 24 hour  Intake                0 ml  Output                0 ml  Net                0 ml    LBM: Last BM Date: 08/24/16 Baseline Weight: Weight: 72.6 kg (160 lb) Most recent weight: Weight: 67.1 kg (148 lb)     Palliative Assessment/Data:     Time In: 1500 Time Out: 1540 Time Total: 31mn Greater than 50%  of this time was spent counseling and coordinating care related  to the above assessment and plan.  Signed by: Vinie Sill, NP Palliative Medicine Team Pager # [phone] (M-F 8a-5p) Team Phone # [phone] (Nights/Weekends)

## 2016-08-26 NOTE — Care Management Important Message (Signed)
Important Message  Patient Details  Name: Alexis Sanders MRN: [id number] Date of Birth: 02/06/1951   Medicare Important Message Given:  Yes    Jonathon Castelo, Annamarie MajorCheryl U, RN 08/26/2016, 12:42 PM

## 2016-08-26 NOTE — Progress Notes (Addendum)
Pharmacy Antibiotic Note  Alexis Sanders is a 65 y.o. female admitted on 08/17/2016 with pneumonia.  Patient was originally started on ceftriaxone and azithromycin and s/p meropenem for HCAP.  Pt remains febrile today and pharmacy asked to begin vancomycin.  Plan:  1. Vancomycin 1g IV q 24 hrs. 2. F/u renal function and clinical course. 3. Vancomycin trough at steady state as indicated.  Height: 5\' 3"  (160 cm) Weight: 148 lb (67.1 kg) IBW/kg (Calculated) : 52.4  Temp (24hrs), Avg:101.2 F (38.4 C), Min:98.6 F (37 C), Max:103.6 F (39.8 C)   Recent Labs Lab 08/22/16 0350 08/22/16 1630 08/23/16 0445 08/24/16 0330 08/25/16 0604 08/26/16 1117  WBC 13.0*  --  9.9 10.1 11.8* 12.3*  CREATININE 2.57* 2.26* 2.05* 1.61* 1.54* 1.71*    Estimated Creatinine Clearance: 30.2 mL/min (by C-G formula based on SCr of 1.71 mg/dL (H)).    Allergies  Allergen Reactions  . Codeine Nausea Only  . Metformin Diarrhea  . Penicillins Rash    Has patient had a PCN reaction causing immediate rash, facial/tongue/throat swelling, SOB or lightheadedness with hypotension:Yes Has patient had a PCN reaction causing severe rash involving mucus membranes or skin necrosis:unsure. Has patient had a PCN reaction that required hospitalization:No Has patient had a PCN reaction occurring within the last 10 years:No If all of the above answers are "NO", then may proceed with Cephalosporin use. Has patient had a PCN reaction causing immediate rash, faci    Antimicrobials this admission: Ceftriaxone 10/30 >> 10/31 Azithromycin 10/30 >> 11/3 Primaxin 10/31 >> 11/3 Meropenem 11/3 > 11/8  Dose adjustments this admission: N/A  Microbiology results: 10/30 BCx: neg 10/30 UCx: neg mult spec MRSA PCR Pos 10/31 Flu neg  Tad MooreJessica Paiton [place], Pharm D, BCPS  Clinical Pharmacist Pager [phone]  08/26/2016 5:02 PM
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
## 2016-08-26 NOTE — Progress Notes (Signed)
Physical Therapy Treatment Patient Details Name: Alexis Sanders MRN: [id number] DOB: 08/17/1951 Today's Date: 08/26/2016    History of Present Illness Pt adm with acute hypoxic respiratory failure from aspiration pneumonia. Pt intubated 11/3 and extubated 11/5. PMH - Parkinsons, bipolar, DM.    PT Comments    Patient seen for therapy session in conjunction with OT therapist. Total assist for mobility, tolerated extended time EOB with LE therapeutic exercise. Additionally, patient with incontinence, assisted with hygiene and peri care. SNF remains appropriate at this time. Will see as indicated.  Follow Up Recommendations  SNF     Equipment Recommendations  Other (comment) (to be assessed)    Recommendations for Other Services       Precautions / Restrictions Precautions Precautions: Fall Precaution Comments: feeding tube in place Restrictions Weight Bearing Restrictions: No    Mobility  Bed Mobility Overal bed mobility: Needs Assistance Bed Mobility: Supine to Sit;Sit to Supine;Rolling Rolling: Max assist;Total assist   Supine to sit: +2 for physical assistance;Total assist Sit to supine: +2 for physical assistance;Total assist   General bed mobility comments: +2 total Assist for all aspect, except rolling to the left patient able to use RUE to grab rail (max assist)  Transfers                 General transfer comment: Did not attempt due to poor sitting balance and weakness  Ambulation/Gait                 Stairs            Wheelchair Mobility    Modified Rankin (Stroke Patients Only)       Balance Overall balance assessment: Needs assistance Sitting-balance support: Bilateral upper extremity supported Sitting balance-Leahy Scale: Zero Sitting balance - Comments: Max assist for increased time EOB, performed some general LE ther ex as well as total assist for self care tasks Postural control: Posterior lean                           Cognition Arousal/Alertness: Awake/alert Behavior During Therapy: Flat affect Overall Cognitive Status: No family/caregiver present to determine baseline cognitive functioning Area of Impairment: Following commands       Following Commands: Follows one step commands inconsistently            Exercises General Exercises - Lower Extremity Ankle Circles/Pumps: AROM;Both;5 reps Long Arc Quad: AROM;Both;5 reps    General Comments        Pertinent Vitals/Pain Pain Assessment: Faces Faces Pain Scale: No hurt    Home Living                      Prior Function            PT Goals (current goals can now be found in the care plan section) Acute Rehab PT Goals Patient Stated Goal: Unable to state PT Goal Formulation: Patient unable to participate in goal setting Time For Goal Achievement: 09/07/16 Potential to Achieve Goals: Fair Progress towards PT goals: Progressing toward goals    Frequency    Min 2X/week      PT Plan Current plan remains appropriate    Co-evaluation PT/OT/SLP Co-Evaluation/Treatment: Yes Reason for Co-Treatment: Complexity of the patient's impairments (multi-system involvement) PT goals addressed during session: Mobility/safety with mobility       End of Session   Activity Tolerance: Patient tolerated treatment well Patient left: in bed;with call bell/phone  within reach;with nursing/sitter in room     Time: 1135-1156 PT Time Calculation (min) (ACUTE ONLY): 21 min  Charges:  $Therapeutic Activity: 8-22 mins                    G CodesFabio Asa:      Christina Waldrop J 08/26/2016, 12:06 PM Charlotte Crumbevon Lauretta Sallas, PT DPT  [phone]

## 2016-08-26 NOTE — Progress Notes (Signed)
Inpatient Diabetes Program Recommendations  AACE/ADA: New Consensus Statement on Inpatient Glycemic Control (2015)  Target Ranges:  Prepandial:   less than 140 mg/dL      Peak postprandial:   less than 180 mg/dL (1-2 hours)      Critically ill patients:  140 - 180 mg/dL   Lab Results  Component Value Date   GLUCAP 234 (H) 08/26/2016   HGBA1C 6.5 (H) 08/18/2016    Review of Glycemic Control  TF changed from Vital to Glucerna for improved glucose control. Still needs TF coverage. Lantus increased to 8 units QHS. CBGs still in 200s, above goal.  Inpatient Diabetes Program Recommendations: Consider addition of Novolog 3 units Q4H.  Will continue to follow. Thank you. Alexis Sanders, RD, LDN, CDE Inpatient Diabetes Coordinator [phone]

## 2016-08-26 NOTE — Evaluation (Signed)
Occupational Therapy Evaluation Patient Details Name: Alexis Sanders MRN: [id number] DOB: 12/19/1950 Today's Date: 08/26/2016    History of Present Illness Pt adm with acute hypoxic respiratory failure from aspiration pneumonia. Pt intubated 11/3 and extubated 11/5. PMH - Parkinsons, bipolar, DM.   Clinical Impression   Pt with significant  decline in function and safety with ADLs and ADL mobility with decreased strength, balance, endurance, cognition and B UE ROM/function    Follow Up Recommendations  SNF;Supervision/Assistance - 24 hour    Equipment Recommendations  Other (comment) (TBD)    Recommendations for Other Services       Precautions / Restrictions Precautions Precautions: Fall Precaution Comments: feeding tube in place Restrictions Weight Bearing Restrictions: No      Mobility Bed Mobility Overal bed mobility: Needs Assistance Bed Mobility: Supine to Sit;Sit to Supine;Rolling Rolling: Max assist;Total assist   Supine to sit: +2 for physical assistance;Total assist Sit to supine: +2 for physical assistance;Total assist   General bed mobility comments: +2 total Assist for all aspect, except rolling to the left patient able to use RUE to grab rail (max assist)  Transfers                 General transfer comment: Did not attempt due to poor sitting balance and weakness    Balance Overall balance assessment: Needs assistance Sitting-balance support: Bilateral upper extremity supported Sitting balance-Leahy Scale: Zero Sitting balance - Comments: Max assist for increased time EOB, performed some general LE ther ex as well as total assist for self care tasks Postural control: Posterior lean                                  ADL Overall ADL's : Needs assistance/impaired Eating/Feeding: NPO   Grooming: Total assistance;Sitting;Bed level   Upper Body Bathing: Sitting;Bed level;Total assistance   Lower Body Bathing: Bed level;Total  assistance;Sitting/lateral leans   Upper Body Dressing : Sitting;Bed level;Total assistance   Lower Body Dressing: Total assistance;Bed level;Sitting/lateral leans   Toilet Transfer:  (bed level for toileting) Toilet Transfer Details (indicate cue type and reason): Did not attempt OOB due to poor sitting balance and weakness Toileting- Clothing Manipulation and Hygiene: Bed level;Total assistance       Functional mobility during ADLs:  (bed mobility to sit EOB with max A for balance/support)       Vision  unable to assess due to cognition              Pertinent Vitals/Pain Pain Assessment: Faces Faces Pain Scale: No hurt     Hand Dominance     Extremity/Trunk Assessment Upper Extremity Assessment Upper Extremity Assessment: Generalized weakness;RUE deficits/detail;LUE deficits/detail;Difficult to assess due to impaired cognition RUE Deficits / Details: Very minimal active movement noted LUE Deficits / Details: Very minimal active movement noted           Communication Communication Communication: Expressive difficulties   Cognition Arousal/Alertness: Awake/alert Behavior During Therapy: Flat affect Overall Cognitive Status: No family/caregiver present to determine baseline cognitive functioning Area of Impairment: Following commands       Following Commands: Follows one step commands inconsistently           General Comments   pt cooperative                Home Living Family/patient expects to be discharged to:: Skilled nursing facility Living Arrangements: Other relatives  Additional Comments: Pt unable to describe home set up      Prior Functioning/Environment          Comments: Per MD office notes in Care Everywhere it appears pt was able to stand at least in October. MD office reports pt with falls due to trying to get up on her own.        OT Problem List: Decreased strength;Impaired UE  functional use;Decreased knowledge of use of DME or AE;Decreased coordination;Decreased activity tolerance;Pain;Decreased cognition;Impaired balance (sitting and/or standing)   OT Treatment/Interventions: Self-care/ADL training;DME and/or AE instruction;Therapeutic activities;Balance training;Therapeutic exercise;Neuromuscular education;Patient/family education;Cognitive remediation/compensation    OT Goals(Current goals can be found in the care plan section) Acute Rehab OT Goals Patient Stated Goal: Unable to state OT Goal Formulation: Patient unable to participate in goal setting Time For Goal Achievement: 09/02/16 Potential to Achieve Goals: Good ADL Goals Pt Will Perform Grooming: with max assist;sitting;bed level Additional ADL Goal #1: Pt will perform bed mobility/rolling with mod A  for pericare/hygiene by staff Additional ADL Goal #2: Pt will sit EOB x 10 minutes with mod A for balance/support duirng grooming tasks Additional ADL Goal #3: Pt will tolerate ROM of B UEs in all planes x 2 sets 10 reps  OT Frequency: Min 2X/week   Barriers to D/C: Inaccessible home environment          Co-evaluation PT/OT/SLP Co-Evaluation/Treatment: Yes Reason for Co-Treatment: Complexity of the patient's impairments (multi-system involvement);For patient/therapist safety PT goals addressed during session: Mobility/safety with mobility OT goals addressed during session: ADL's and self-care      End of Session    Activity Tolerance: No increased pain;Patient limited by fatigue Patient left: in bed;with call bell/phone within reach;with bed alarm set   Time: 1135-1156 OT Time Calculation (min): 21 min Charges:  OT General Charges $OT Visit: 1 Procedure OT Evaluation $OT Eval Moderate Complexity: 1 Procedure G-Codes:    Alexis Sanders, Alexis Sanders Alexis Sanders 08/26/2016, 1:42 PM

## 2016-08-26 NOTE — Progress Notes (Signed)
Speech Language Pathology Treatment: Dysphagia  Patient Details Name: Alexis HoleBetty S Spark MRN: [id number] DOB: 09/01/1951 Today's Date: 08/26/2016 Time: [id number] SLP Time Calculation (min) (ACUTE ONLY): 8 min  Assessment / Plan / Recommendation Clinical Impression  Pt meeting with Palliative medicine; caregiver present.  Provided only limited ice chips with immediate cough elicited, concerning for aspiration.  Tremor remains prevalent.  Pt not ready for PO diet today.  MBS was completed on 11/2, revealing mild oropharyngeal dysphagia, mild silent penetration of thin liquids.  Diet order was never written as pt had decline in respiratory function and was intubated subsequently that day.  Recommend repeating MBS prior to resuming PO diet; pt currently with cortrak. Will follow.    HPI HPI: 65 year old female with a history of DM 2, hypothyroidism, CKD, bipolar disorder, and parkinsonism presentedwith 3-4 day history of increasing confusion and tremors. The patient was seen at [facility] on 08/16/2016. She was discharged from the emergency department with prescriptions for levofloxacin, promethazine, and albuterol. Unfortunately, the patient's symptoms persisted despite taking levofloxacin. As a result, the patient was brought to the emergency department at [facility] via EMS. Patient has had a nonproductive cough but no vomiting, diarrhea, dysuria. History was obtained from the patient's husband as the patient was encephalopathic. Workup in the emergency department revealed significant pyuria and chest x-ray showing bilateral consolidations, right greater than left.  Intubated 11/3-11/5.       SLP Plan  Continue with current plan of care     Recommendations  Diet recommendations: NPO Medication Administration: Via alternative means                Oral Care Recommendations: Oral care QID Plan: Continue with current plan of care       GO              Mixtli Reno L. Samson Fredericouture, [place] CCC/SLP Pager  [phone]   [name], Briggs Edelen Laurice 08/26/2016, 3:42 PM

## 2016-08-27 ENCOUNTER — Inpatient Hospital Stay (HOSPITAL_COMMUNITY): Payer: Medicare Other

## 2016-08-27 ENCOUNTER — Encounter (HOSPITAL_COMMUNITY): Payer: Self-pay | Admitting: Radiology

## 2016-08-27 DIAGNOSIS — Z515 Encounter for palliative care: Secondary | ICD-10-CM

## 2016-08-27 DIAGNOSIS — R509 Fever, unspecified: Secondary | ICD-10-CM

## 2016-08-27 DIAGNOSIS — Z7189 Other specified counseling: Secondary | ICD-10-CM

## 2016-08-27 DIAGNOSIS — R1310 Dysphagia, unspecified: Secondary | ICD-10-CM

## 2016-08-27 LAB — BASIC METABOLIC PANEL
ANION GAP: 7 (ref 5–15)
BUN: 33 mg/dL — ABNORMAL HIGH (ref 6–20)
CHLORIDE: 119 mmol/L — AB (ref 101–111)
CO2: 21 mmol/L — AB (ref 22–32)
Calcium: 8.6 mg/dL — ABNORMAL LOW (ref 8.9–10.3)
Creatinine, Ser: 1.75 mg/dL — ABNORMAL HIGH (ref 0.44–1.00)
GFR calc non Af Amer: 29 mL/min — ABNORMAL LOW (ref >60)
GFR, EST AFRICAN AMERICAN: 34 mL/min — AB (ref >60)
Glucose, Bld: 169 mg/dL — ABNORMAL HIGH (ref 65–99)
POTASSIUM: 4.2 mmol/L (ref 3.5–5.1)
SODIUM: 147 mmol/L — AB (ref 135–145)

## 2016-08-27 LAB — GLUCOSE, CAPILLARY
GLUCOSE-CAPILLARY: 150 mg/dL — AB (ref 65–99)
GLUCOSE-CAPILLARY: 175 mg/dL — AB (ref 65–99)
GLUCOSE-CAPILLARY: 179 mg/dL — AB (ref 65–99)
GLUCOSE-CAPILLARY: 107 mg/dL — AB (ref 65–99)
Glucose-Capillary: 152 mg/dL — ABNORMAL HIGH (ref 65–99)

## 2016-08-27 LAB — URINE CULTURE: Culture: NO GROWTH

## 2016-08-27 LAB — PHOSPHORUS: PHOSPHORUS: 2.8 mg/dL (ref 2.5–4.6)

## 2016-08-27 MED ORDER — IOPAMIDOL (ISOVUE-300) INJECTION 61%
15.0000 mL | INTRAVENOUS | Status: AC
  Administered 2016-08-27 (×2): 15 mL via ORAL

## 2016-08-27 MED ORDER — ENOXAPARIN SODIUM 30 MG/0.3ML ~~LOC~~ SOLN
30.0000 mg | SUBCUTANEOUS | Status: DC
  Administered 2016-08-27 – 2016-08-30 (×4): 30 mg via SUBCUTANEOUS
  Filled 2016-08-27 (×4): qty 0.3

## 2016-08-27 NOTE — Progress Notes (Addendum)
Daily Progress Note   Patient Name: Alexis Sanders       Date: 08/27/2016 DOB: [birth year]  Age: 65 y.o. MRN#: [id number] Attending Physician: Robbie Lis, MD Primary Care Physician: Vicie Mutters, PA-C Admit Date: 08/17/2016  Reason for Consultation/Follow-up: Establishing goals of care  Subjective: Alexis Sanders is awake and alert. She answers basic questions but is unable to remember where she is at. When asked about who helps her make decisions her son or husband she answers "husband." She did give me permission to contact both.   I spoke with both son, Cheryle Horsfall, and legally separated husband, Riona Lahti but separately. They both agree that they would like to work together to make decisions for Alexis Sanders. They have similar concerns and wishes at this time. Expressed my concern over fever as well as severe dysphagia. They both tell me that she expressed when more competent at the beginning of her admission that she would want resuscitation and life support but would not like to be prolonged on machines. Son seemed to have more desire for long term feeding tube if indicated but husband was unsure. Also expressed concern if this could be contributing to her fever and infection this may be irreversible.   Both expressed concern over her medications and if they have contributed to her decline. May be beneficial to consider neurology consult. I did review Care Everywhere 06/08/2016 note from neurology, Dr. Ermalene Postin, assessing tremor and considering drug induced Parkinson's but also possibly normal pressure hydrocephalus (family was considering LP?). He also began amantadine but noted this could worsen some of her symptoms. He also noted for PCP to consider change from Abilify to Zyprexa that could have  less extraparenchymal side effects.   If further finite decisions need to be made the son and husband agree to meet together to discuss further. For now they wish to continue to assess for source of fever and continue treatment with hopes of some improvement. We did discuss quality of life in relation to irreversible health issues and this consideration in decisions.   Length of Stay: 10  Current Medications: Scheduled Meds:  . amantadine  100 mg Per Tube BID  . ARIPiprazole  10 mg Per Tube QPM  . buPROPion  100 mg Per Tube Daily  . chlorhexidine gluconate (MEDLINE KIT)  15 mL Mouth Rinse BID  . clonazePAM  0.25 mg Per Tube BID  . enoxaparin (LOVENOX) injection  30 mg Subcutaneous Q24H  . free water  300 mL Per Tube Q4H  . gabapentin  200 mg Per Tube BID  . insulin aspart  0-15 Units Subcutaneous Q4H  . insulin glargine  12 Units Subcutaneous QHS  . iopamidol  15 mL Oral Q1 Hr x 2  . levothyroxine  125 mcg Per Tube QAC breakfast  . mouth rinse  15 mL Mouth Rinse BID  . sodium chloride flush  10-40 mL Intracatheter Q12H  . vancomycin  1,000 mg Intravenous Q24H    Continuous Infusions: . sodium chloride 75 mL/hr at 08/27/16 0618  . feeding supplement (GLUCERNA 1.2 CAL) 1,000 mL (08/27/16 0618)    PRN Meds: acetaminophen (TYLENOL) oral liquid 160 mg/5 mL, albuterol, bisacodyl, docusate, LORazepam, [DISCONTINUED] ondansetron **OR** ondansetron (ZOFRAN) IV  Physical Exam  Constitutional: She appears well-developed.  HENT:  Head: Normocephalic and atraumatic.  Temporary feeding tube in nare.   Cardiovascular: Normal rate.   Pulmonary/Chest: Effort normal. No accessory muscle usage. No tachypnea. No respiratory distress.  Abdominal: Soft. Normal appearance.  Neurological: She is alert. She is disoriented.  Nursing note and vitals reviewed.           Vital Signs: BP (!) 127/48 (BP Location: Left Arm)   Pulse 80   Temp 99.3 F (37.4 C) (Oral)   Resp 20   Ht '5\' 3"'  (1.6 m)    Wt 67.3 kg (148 lb 5.9 oz)   SpO2 97%   BMI 26.28 kg/m  SpO2: SpO2: 97 % O2 Device: O2 Device: Not Delivered O2 Flow Rate: O2 Flow Rate (L/min): 1 L/min  Intake/output summary:  Intake/Output Summary (Last 24 hours) at 08/27/16 1255 Last data filed at 08/27/16 1028  Gross per 24 hour  Intake          6231.67 ml  Output                0 ml  Net          6231.67 ml   LBM: Last BM Date: 08/24/16 Baseline Weight: Weight: 72.6 kg (160 lb) Most recent weight: Weight: 67.3 kg (148 lb 5.9 oz)       Palliative Assessment/Data: PPS: 30%     Patient Active Problem List   Diagnosis Date Noted  . Aspiration pneumonia of right lower lobe due to vomit ([place])   . Acute respiratory failure with hypoxia ([place]) 08/20/2016  . SOB (shortness of breath)   . Acute encephalopathy 08/18/2016  . UTI (urinary tract infection) 08/18/2016  . ARF (acute renal failure) ([place]) 08/18/2016  . Hypothyroidism 08/18/2016  . Bipolar 1 disorder ([place]) 08/18/2016  . Parkinson's disease ([place]) 08/18/2016  . Diabetes mellitus type 2 in nonobese ([place]) 08/18/2016  . Acute renal failure superimposed on stage 3 chronic kidney disease ([place]) 08/18/2016  . Aspiration pneumonia ([place]) 08/18/2016  . Septic shock ([place]) 08/17/2016    Palliative Care Assessment & Plan   HPI:  65 y.o. female  with past medical history of Parkinson's disease, diabetes mellitus type II, bipolar disorder, hypothyroidism admitted on 08/17/2016 with increased confusion and tremors. She was treated for pneumonia and UTI and required transient intubation 11/3-11/5 but continues to have fever of unknown origin as well as severe dysphagia.    Assessment: Lying in bed. Denies pain. Able to answer some simple questions. Appropriate responses to questions when able to answer.  Recommendations/Plan:  Consider neuro consult.   SLP continue to follow for dysphagia.   Awaiting CT chest/abd/pelvis for assessment of fever/source of infection.   Goals  of Care and Additional Recommendations:  Limitations on Scope of Treatment: Full Scope Treatment, no tracheostomy  Code Status:  Full code  Prognosis:   Unable to determine  Discharge Planning:  To Be Determined. Both son and husband concerned with placement and discharge planning. Agree with SNF but location may be disagreed. Son was very concerned with hygiene of her living environment PTA with her husband's family.    Thank you for allowing the Palliative Medicine Team to assist in the care of this patient.   Time In/Out: 1200-1250, 1550-1640 Total Time 141mn Prolonged Time Billed  yes       Greater than 50%  of this time was spent counseling and coordinating care related to the above assessment and plan.  AVinie Sill NP Palliative Medicine Team Pager # [phone](M-F 8a-5p) Team Phone # [phone](Nights/Weekends)

## 2016-08-27 NOTE — Progress Notes (Signed)
Patient ID: Alexis Sanders, female   DOB: [birth year], 65 y.o.   MRN: [id number]  PROGRESS NOTE    Zakia Sainato Tisdell  WNI:[id number] DOB: Dec 26, 1950 DOA: 08/17/2016  PCP: Vicie Mutters, PA-C   Brief Narrative:   65 y.o.female with past medical history of parkinson's disease started on amantadine August 2017, DM2, Bipolar disorder, hypothyroidism who was brought to [facility] with confusion, tremors for 3-4 days prior to this admission. Patient also had reports of nonproductive cough. On admission, patient was febrile with chest x-ray showing bilateral infiltrates and UA consistent with UTI.  Significant Events: 10/29 seen at [facility] > given [place] 10/30 admitted [facility] 11/02 PCCM called 11/05 extubated 11/06 transfer to tele  Assessment & Plan:    Fever of unknown origin - Patient continues to spike fever, T max in past 24 hours 100.4 F - Initially, the thought was that patient has possible pneumonia versus urinary tract infection - Patient was broadly covered with Zithromax, Primaxin, meropenem. Meropenem stopped 08/26/2016. Currently patient is on vancomycin which was started 08/26/2016 - Considering patient is continuing to spike fever it would be reasonable to obtain CT chest and abdomen for further evaluation - Chest x-ray yesterday showed mild persistent peribronchial airspace opacities in the left lower thorax - Respiratory virus panel is unremarkable - Blood cultures show no growth to date - Urine culture with multiple species, none predominant - If patient continues to spike fever while on vancomycin we will get infectious disease consultation  Sepsis due to Bibasilar aspiration PNA +/- UTI  - As noted above, pt on vancomycin at this time - CT chest and abdomen for further evaluation of fever of unknown origin  Acute hypoxic respiratory failure secondary to aspiration pneumonitis - Intubated 11/3 - failed swallow eval - Stable respiratory status  AKI -  Secondary to prerenal etiology, sepsis - Creatinine 1.75 this morning  Hypernatremia - likely due to sepsis, volume depletion - Continue IV fluids   Hypokalemia - Supplemented within normal limits  Hypophosphatemia - Replaced  Diabetes mellitus, uncontrolled without complications without long-term insulin use - A1c 6.5 - Patient takes glipizide at home - Patient is currently on Lantus 12 units at bedtime along with sliding scale insulin  Hypothyroidism - Continue levothyroxine 125 g daily  Drug induced Parkinson's disease - Continue amantadine   Bipolar disorder/Anxiety - Continue clonazepam, Wellbutrin, Abilify  Anemia of critical illness and chronic disease - Hemoglobin stable   Dysphagia / Severe protein calorie malnutrition  - Continue tube feeds for now   DVT prophylaxis: Lovenox subQ Code Status: full code  Family Communication: no family at the bedside this am  Disposition Plan: palliative care on board, not yet stable for discharge today as she continues to spike a fever    Consultants:   PCCM  Palliative care   Procedures:   Intubation    Antimicrobials:  Rocephin 10/30 Zithromax 10/30 > 11/02 Primaxin 10/31 >11/02  Meropenem 11/03 >11/8 Vancomycin 11/08 -->   Subjective: Spiked fever overnight.   Objective: Vitals:   08/26/16 1754 08/26/16 1803 08/26/16 2131 08/27/16 0405  BP: 132/76  139/63 (!) 145/63  Pulse: (!) 106  96 90  Resp: (!) 22  (!) 22 (!) 21  Temp:  (!) 100.4 F (38 C) (!) 100.9 F (38.3 C)   TempSrc:  Axillary Axillary   SpO2: 96%  95% 93%  Weight:   67.3 kg (148 lb 5.9 oz)   Height:        Intake/Output  Summary (Last 24 hours) at 08/27/16 0947 Last data filed at 08/27/16 0600  Gross per 24 hour  Intake          6231.67 ml  Output                0 ml  Net          6231.67 ml   Filed Weights   08/25/16 0258 08/26/16 0425 08/26/16 2131  Weight: 66.2 kg (146 lb) 67.1 kg (148 lb) 67.3 kg (148 lb 5.9 oz)      Examination:  General exam: Appears calm and comfortable, no distress  Respiratory system: Clear to auscultation. Respiratory effort normal. Cardiovascular system: S1 & S2 heard, rate controlled  Gastrointestinal system: (+) BS, non tender  Central nervous system: disoriented, otherwise not focal  Extremities: no edema, palpable pulses  Skin: No rashes, lesions or ulcers Psychiatry: Unable to assess due to altered mental status   Data Reviewed: I have personally reviewed following labs and imaging studies  CBC:  Recent Labs Lab 08/22/16 0350 08/23/16 0445 08/24/16 0330 08/25/16 0604 08/26/16 1117  WBC 13.0* 9.9 10.1 11.8* 12.3*  HGB 11.0* 10.2* 9.4* 11.1* 10.3*  HCT 35.5* 33.4* 30.7* 35.9* 33.2*  MCV 84.9 84.8 84.6 83.9 84.9  PLT 263 222 218 296 962   Basic Metabolic Panel:  Recent Labs Lab 08/21/16 1300 08/21/16 1700 08/22/16 0350 08/22/16 1630 08/23/16 0445 08/24/16 0330 08/25/16 0604 08/26/16 1117 08/27/16 0511  NA  --  149* 149* 145 148* 147* 149* 154* 147*  K  --  2.9* 3.8 3.7 4.1 3.7 3.5 3.8 4.2  CL  --  109 112* 111 112* 111 116* 120* 119*  CO2  --  _0 21*  GLUCOSE  --  180* 212* 217* 222* 193* 275* 135* 169*  BUN  --  40* 56* 62* 62* 50* 42* 34* 33*  CREATININE  --  2.06* 2.57* 2.26* 2.05* 1.61* 1.54* 1.71* 1.75*  CALCIUM  --  9.3 9.2 8.8* 8.7* 8.5* 9.4 8.7* 8.6*  MG 2.0 1.9 1.9 1.8  --   --  1.9  --   --   PHOS 4.3 3.0 1.9* 1.6*  --   --  <1.0* 2.4* 2.8   GFR: Estimated Creatinine Clearance: 29.5 mL/min (by C-G formula based on SCr of 1.75 mg/dL (H)). Liver Function Tests:  Recent Labs Lab 08/24/16 0330 08/26/16 1117  AST 43* 34  ALT 55* 39  ALKPHOS 145* 129*  BILITOT 0.3 0.5  PROT 5.5* 6.1*  ALBUMIN 1.9* 2.5*   No results for input(s): LIPASE, AMYLASE in the last 168 hours. No results for input(s): AMMONIA in the last 168 hours. Coagulation Profile: No results for input(s): INR, PROTIME in the last 168  hours. Cardiac Enzymes: No results for input(s): CKTOTAL, CKMB, CKMBINDEX, TROPONINI in the last 168 hours. BNP (last 3 results) No results for input(s): PROBNP in the last 8760 hours. HbA1C: No results for input(s): HGBA1C in the last 72 hours. CBG:  Recent Labs Lab 08/26/16 1550 08/26/16 2004 08/26/16 2359 08/27/16 0405 08/27/16 0757  GLUCAP 201* 212* 142* 150* 175*   Lipid Profile: No results for input(s): CHOL, HDL, LDLCALC, TRIG, CHOLHDL, LDLDIRECT in the last 72 hours. Thyroid Function Tests: No results for input(s): TSH, T4TOTAL, FREET4, T3FREE, THYROIDAB in the last 72 hours. Anemia Panel: No results for input(s): VITAMINB12, FOLATE, FERRITIN, TIBC, IRON, RETICCTPCT in the last 72 hours. Urine analysis:    Component  Value Date/Time   COLORURINE YELLOW 08/26/2016 1226   APPEARANCEUR CLEAR 08/26/2016 1226   LABSPEC 1.012 08/26/2016 1226   PHURINE 8.0 08/26/2016 1226   GLUCOSEU NEGATIVE 08/26/2016 1226   HGBUR NEGATIVE 08/26/2016 1226   BILIRUBINUR NEGATIVE 08/26/2016 1226   KETONESUR NEGATIVE 08/26/2016 1226   PROTEINUR NEGATIVE 08/26/2016 1226   NITRITE NEGATIVE 08/26/2016 1226   LEUKOCYTESUR NEGATIVE 08/26/2016 1226   Sepsis Labs: _0 (procalcitonin:4,lacticidven:4)   ) Recent Results (from the past 240 hour(s))  Blood Culture (routine x 2)     Status: None   Collection Time: 08/17/16  7:00 PM  Result Value Ref Range Status   Specimen Description BLOOD RIGHT FOREARM  Final   Special Requests BOTTLES DRAWN AEROBIC AND ANAEROBIC 5CC  Final   Culture NO GROWTH 5 DAYS  Final   Report Status 08/22/2016 FINAL  Final  Blood Culture (routine x 2)     Status: None   Collection Time: 08/17/16  7:17 PM  Result Value Ref Range Status   Specimen Description BLOOD RIGHT WRIST  Final   Special Requests BOTTLES DRAWN AEROBIC AND ANAEROBIC 5CC  Final   Culture NO GROWTH 5 DAYS  Final   Report Status 08/22/2016 FINAL  Final  Urine culture     Status: Abnormal    Collection Time: 08/17/16  7:38 PM  Result Value Ref Range Status   Specimen Description URINE, RANDOM  Final   Special Requests NONE  Final   Culture MULTIPLE SPECIES PRESENT, SUGGEST RECOLLECTION (A)  Final   Report Status 08/19/2016 FINAL  Final  Culture, blood (routine x 2) Call MD if unable to obtain prior to antibiotics being given     Status: None   Collection Time: 08/18/16  1:15 AM  Result Value Ref Range Status   Specimen Description BLOOD LEFT HAND  Final   Special Requests BOTTLES DRAWN AEROBIC AND ANAEROBIC 5ML  Final   Culture NO GROWTH 5 DAYS  Final   Report Status 08/23/2016 FINAL  Final  MRSA PCR Screening     Status: Abnormal   Collection Time: 08/18/16  2:15 AM  Result Value Ref Range Status   MRSA by PCR POSITIVE (A) NEGATIVE Final    Comment:        The GeneXpert MRSA Assay (FDA approved for NASAL specimens only), is one component of a comprehensive MRSA colonization surveillance program. It is not intended to diagnose MRSA infection nor to guide or monitor treatment for MRSA infections. RESULT CALLED TO, READ BACK BY AND VERIFIED WITH: C WOODARD,RN _1  08/18/16 MKELLY,MLT   Culture, blood (routine x 2) Call MD if unable to obtain prior to antibiotics being given     Status: None   Collection Time: 08/18/16  2:49 AM  Result Value Ref Range Status   Specimen Description BLOOD BLOOD LEFT FOREARM  Final   Special Requests BOTTLES DRAWN AEROBIC AND ANAEROBIC 5CC  Final   Culture NO GROWTH 5 DAYS  Final   Report Status 08/23/2016 FINAL  Final  Respiratory Panel by PCR     Status: None   Collection Time: 08/20/16  1:27 PM  Result Value Ref Range Status   Adenovirus NOT DETECTED NOT DETECTED Final   Coronavirus 229E NOT DETECTED NOT DETECTED Final   Coronavirus HKU1 NOT DETECTED NOT DETECTED Final   Coronavirus NL63 NOT DETECTED NOT DETECTED Final   Coronavirus OC43 NOT DETECTED NOT DETECTED Final   Metapneumovirus NOT DETECTED NOT DETECTED Final    Rhinovirus / Enterovirus NOT  DETECTED NOT DETECTED Final   Influenza A NOT DETECTED NOT DETECTED Final   Influenza B NOT DETECTED NOT DETECTED Final   Parainfluenza Virus 1 NOT DETECTED NOT DETECTED Final   Parainfluenza Virus 2 NOT DETECTED NOT DETECTED Final   Parainfluenza Virus 3 NOT DETECTED NOT DETECTED Final   Parainfluenza Virus 4 NOT DETECTED NOT DETECTED Final   Respiratory Syncytial Virus NOT DETECTED NOT DETECTED Final   Bordetella pertussis NOT DETECTED NOT DETECTED Final   Chlamydophila pneumoniae NOT DETECTED NOT DETECTED Final   Mycoplasma pneumoniae NOT DETECTED NOT DETECTED Final      Radiology Studies: Dg Chest Port 1 View Result Date: 08/26/2016 Mild persistent peribronchial airspace opacities in the left lower thorax. Mild pulmonary vascular congestion. Electronically Signed   By: Fidela Salisbury M.D.   On: 08/26/2016 13:06     Scheduled Meds: . amantadine  100 mg Per Tube BID  . ARIPiprazole  10 mg Per Tube QPM  . buPROPion  100 mg Per Tube Daily  . chlorhexidine gluconate (MEDLINE KIT)  15 mL Mouth Rinse BID  . clonazePAM  0.25 mg Per Tube BID  . enoxaparin (LOVENOX) injection  30 mg Subcutaneous Q24H  . free water  300 mL Per Tube Q4H  . gabapentin  200 mg Per Tube BID  . insulin aspart  0-15 Units Subcutaneous Q4H  . insulin glargine  12 Units Subcutaneous QHS  . levothyroxine  125 mcg Per Tube QAC breakfast  . mouth rinse  15 mL Mouth Rinse BID  . sodium chloride flush  10-40 mL Intracatheter Q12H  . vancomycin  1,000 mg Intravenous Q24H   Continuous Infusions: . sodium chloride 75 mL/hr at 08/27/16 0618  . feeding supplement (GLUCERNA 1.2 CAL) 1,000 mL (08/27/16 0618)     LOS: 10 days    Time spent: 25 minutes  Greater than 50% of the time spent on counseling and coordinating the care.   Leisa Lenz, MD [facility] Pager [phone]  If 7PM-7AM, please contact night-coverage [email or web address] Password [facility] 08/27/2016, 9:47 AM

## 2016-08-27 NOTE — Progress Notes (Signed)
Speech Language Pathology Treatment: Dysphagia  Patient Details Name: Alexis Sanders MRN: [id number] DOB: 12/06/1950 Today's Date: 08/27/2016 Time: [id number] SLP Time Calculation (min) (ACUTE ONLY): 11 min  Assessment / Plan / Recommendation Clinical Impression  Diagnostic treatment provided including trials of ice chips and thin liquids. Patient with timely oral transit of bolus. No overt s/s of aspiration noted post swallow however patient with increased WOB, h/o mild dysphagia, and intubation increasing risks. Recommend MBS to determine least restrictive diet.    HPI HPI: 65 year old female with a history of DM 2, hypothyroidism, CKD, bipolar disorder, and parkinsonism presentedwith 3-4 day history of increasing confusion and tremors. [facility]. She was discharged from the emergency department with prescriptions for levofloxacin, promethazine, and albuterol. Unfortunately, the patient's symptoms persisted despite taking levofloxacin. As a result, the [facility]. Patient has had a nonproductive cough but no vomiting, diarrhea, dysuria. History was obtained from the patient's husband as the patient was encephalopathic. Workup in the emergency department revealed significant pyuria and chest x-ray showing bilateral consolidations, right greater than left.  Intubated 11/3-11/5.       SLP Plan  MBS     Recommendations  Diet recommendations: NPO Medication Administration: Via alternative means                Oral Care Recommendations: Oral care QID Plan: MBS       GO             Ferdinand LangoLeah Ausencio Vaden MA, CCC-SLP [phone]    Alson Mcpheeters Meryl 08/27/2016, 1:21 PM

## 2016-08-28 ENCOUNTER — Inpatient Hospital Stay (HOSPITAL_COMMUNITY): Payer: Medicare Other

## 2016-08-28 DIAGNOSIS — R1310 Dysphagia, unspecified: Secondary | ICD-10-CM

## 2016-08-28 LAB — BASIC METABOLIC PANEL
ANION GAP: 9 (ref 5–15)
BUN: 32 mg/dL — AB (ref 6–20)
CALCIUM: 8.7 mg/dL — AB (ref 8.9–10.3)
CO2: 21 mmol/L — ABNORMAL LOW (ref 22–32)
Chloride: 114 mmol/L — ABNORMAL HIGH (ref 101–111)
Creatinine, Ser: 1.52 mg/dL — ABNORMAL HIGH (ref 0.44–1.00)
GFR calc Af Amer: 40 mL/min — ABNORMAL LOW (ref >60)
GFR, EST NON AFRICAN AMERICAN: 35 mL/min — AB (ref >60)
GLUCOSE: 180 mg/dL — AB (ref 65–99)
POTASSIUM: 4 mmol/L (ref 3.5–5.1)
SODIUM: 144 mmol/L (ref 135–145)

## 2016-08-28 LAB — GLUCOSE, CAPILLARY
GLUCOSE-CAPILLARY: 119 mg/dL — AB (ref 65–99)
GLUCOSE-CAPILLARY: 176 mg/dL — AB (ref 65–99)
Glucose-Capillary: 160 mg/dL — ABNORMAL HIGH (ref 65–99)
Glucose-Capillary: 242 mg/dL — ABNORMAL HIGH (ref 65–99)
Glucose-Capillary: 253 mg/dL — ABNORMAL HIGH (ref 65–99)
Glucose-Capillary: 183 mg/dL — ABNORMAL HIGH (ref 65–99)

## 2016-08-28 LAB — CBC
HCT: 35.1 % — ABNORMAL LOW (ref 36.0–46.0)
Hemoglobin: 10.8 g/dL — ABNORMAL LOW (ref 12.0–15.0)
MCH: 26 pg (ref 26.0–34.0)
MCHC: 30.8 g/dL (ref 30.0–36.0)
MCV: 84.6 fL (ref 78.0–100.0)
PLATELETS: 245 10*3/uL (ref 150–400)
RBC: 4.15 MIL/uL (ref 3.87–5.11)
RDW: 14.8 % (ref 11.5–15.5)
WBC: 12.9 10*3/uL — AB (ref 4.0–10.5)

## 2016-08-28 MED ORDER — RESOURCE THICKENUP CLEAR PO POWD
ORAL | Status: DC | PRN
  Filled 2016-08-28 (×2): qty 125

## 2016-08-28 NOTE — Progress Notes (Signed)
Occupational Therapy Treatment Patient Details Name: Alexis Sanders MRN: [id number] DOB: 08/05/1951 Today's Date: 08/28/2016    History of present illness Pt admitted with hypoxic respiratory failure from aspiration. Intubated 11/3-11/5. PMH: Parkinsons, DM, Bipolar.   OT comments  Pt   Follow Up Recommendations     SNF, 24 hour care   Equipment Recommendations       Recommendations for Other Services      Precautions / Restrictions Precautions Precautions: Fall       Mobility Bed Mobility                  Transfers                      Balance                                   ADL Overall ADL's : Needs assistance/impaired Eating/Feeding: Total assistance   Grooming: Total assistance Grooming Details (indicate cue type and reason):  (wiping mouth/face)                                      Vision                     Perception     Praxis      Cognition   Behavior During Therapy: Flat affect Overall Cognitive Status: No family/caregiver present to determine baseline cognitive functioning                       Extremity/Trunk Assessment               Exercises Other Exercises Other Exercises:  (B UE A/AAROM all areas x 10)   Shoulder Instructions       General Comments      Pertinent Vitals/ Pain       Pain Assessment: No/denies pain  Home Living                                          Prior Functioning/Environment              Frequency  Min 2X/week        Progress Toward Goals  OT Goals(current goals can now be found in the care plan section)  Progress towards OT goals: Progressing toward goals  Acute Rehab OT Goals Patient Stated Goal:  (to drink grape juice) ADL Goals Pt Will Perform Eating: with min assist;sitting  Plan Discharge plan remains appropriate    Co-evaluation                 End of Session     Activity  Tolerance Patient tolerated treatment well   Patient Left in bed;with call bell/phone within reach;with bed alarm set   Nurse Communication          Time: 1420-1440 OT Time Calculation (min): 20 min  Charges: OT General Charges $OT Visit: 1 Procedure OT Treatments $Therapeutic Exercise: 8-22 mins  Evern BioMayberry, Charidy Cappelletti Lynn 08/28/2016, 2:55 PM  [phone]

## 2016-08-28 NOTE — Progress Notes (Signed)
Patient ID: Alexis Sanders, female   DOB: 11-07-1950, 65 y.o.   MRN: [id number]  PROGRESS NOTE    Alexis Sanders  MOQ:[id number] DOB: 01/02/1951 DOA: 08/17/2016  PCP: Alexis Mutters, PA-C   Brief Narrative:   65 y.o.female with past medical history of parkinson's disease started on amantadine August 2017, DM2, Bipolar disorder, hypothyroidism who was brought to [facility] with confusion, tremors for 3-4 days prior to this admission. Patient also had reports of nonproductive cough. On admission, patient was febrile with chest x-ray showing bilateral infiltrates and UA consistent with UTI.  Significant Events: 10/29 seen at [facility] > given [place] 10/30 admitted [facility] 11/02 PCCM called 11/05 extubated 11/06 transfer to [place]:    Fever of unknown origin - No fevers in past 24 hours - Initially, the thought was that patient has possible pneumonia versus urinary tract infection - Patient was broadly covered with Zithromax, Primaxin, meropenem. Meropenem stopped 08/26/2016. Currently patient is on vancomycin which was started 08/26/2016 - CT abd/pelvis and chest 11/9 - bilateral patch airspace disease suggest multi pulmonary infection, no intra-abdominal finding to account for fever  - Respiratory virus panel is unremarkable - Blood cultures show no growth to date - Urine culture with multiple species, none predominant - Continue vanco for now   Sepsis due to Bibasilar aspiration PNA +/- UTI  - Continue vanco for now - Pneumonia seen on CT scan   Acute hypoxic respiratory failure secondary to aspiration pneumonitis - Intubated 11/3 - failed swallow eval - Stable respiratory status  AKI - Secondary to prerenal etiology, sepsis - Creatinine improving, 1.5 today   Hypernatremia - Likely due to sepsis, volume depletion - Sodium now WNL  Hypokalemia - Supplemented within normal limits  Hypophosphatemia - Replaced  Diabetes mellitus, uncontrolled  without complications without long-term insulin use - A1c 6.5 - Patient takes glipizide at home - Patient is currently on Lantus 12 units at bedtime along with sliding scale insulin - CBG's in past 24 hours: 160, 176, 242  Hypothyroidism - Continue levothyroxine 125 g daily  Drug induced Parkinson's disease - Continue amantadine   Bipolar disorder/Anxiety - Continue clonazepam, Wellbutrin, Abilify  Anemia of critical illness and chronic disease - Hemoglobin stable   Dysphagia / Severe protein calorie malnutrition  - Has NG tube - Per SLP - dysphagia diet as of 11/10   DVT prophylaxis: Lovenox subQ Code Status: full code  Family Communication: no family at the bedside this am  Disposition Plan: palliative care on board, to SNF or home once we know her nutritional status better, she may be able to advance diet eventually if she continues to improve    Consultants:   PCCM  Palliative care   Procedures:   Intubation    Antimicrobials:  Rocephin 10/30 Zithromax 10/30 > 11/02 Primaxin 10/31 >11/02  Meropenem 11/03 >11/8 Vancomycin 11/08 -->   Subjective: No overnight events.   Objective: Vitals:   08/27/16 1654 08/27/16 2026 08/28/16 0604 08/28/16 0900  BP: (!) 121/59 129/64 (!) 147/62 127/83  Pulse: 72 75 69 75  Resp: _0 Temp: 99 F (37.2 C) 98.7 F (37.1 C) 98.2 F (36.8 C) 97.8 F (36.6 C)  TempSrc: Axillary  Oral Oral  SpO2: 96% 95% 97% 98%  Weight:   66 kg (145 lb 8 oz)   Height:        Intake/Output Summary (Last 24 hours) at 08/28/16 1307 Last data filed at 08/28/16 0900  Gross per 24 hour  Intake             5379 ml  Output                0 ml  Net             5379 ml   Filed Weights   08/26/16 0425 08/26/16 2131 08/28/16 0604  Weight: 67.1 kg (148 lb) 67.3 kg (148 lb 5.9 oz) 66 kg (145 lb 8 oz)    Examination:  General exam: No acute distress  Respiratory system: No wheezing, no rhonchi . Cardiovascular system: S1  & S2 heard, rate controlled  Gastrointestinal system: (+) BS, non tender, not distended  Central nervous system: no focal deficits  Extremities: no edema, palpable pulses  Skin: warm, dry  Psychiatry: No agitation, no restlessness   Data Reviewed: I have personally reviewed following labs and imaging studies  CBC:  Recent Labs Lab 08/23/16 0445 08/24/16 0330 08/25/16 0604 08/26/16 1117 08/28/16 0537  WBC 9.9 10.1 11.8* 12.3* 12.9*  HGB 10.2* 9.4* 11.1* 10.3* 10.8*  HCT 33.4* 30.7* 35.9* 33.2* 35.1*  MCV 84.8 84.6 83.9 84.9 84.6  PLT 222 218 296 280 622   Basic Metabolic Panel:  Recent Labs Lab 08/21/16 1700 08/22/16 0350 08/22/16 1630  08/24/16 0330 08/25/16 0604 08/26/16 1117 08/27/16 0511 08/28/16 0537  NA 149* 149* 145  < > 147* 149* 154* 147* 144  K 2.9* 3.8 3.7  < > 3.7 3.5 3.8 4.2 4.0  CL 109 112* 111  < > 111 116* 120* 119* 114*  CO2 _0 < > _1 21* 21*  GLUCOSE 180* 212* 217*  < > 193* 275* 135* 169* 180*  BUN 40* 56* 62*  < > 50* 42* 34* 33* 32*  CREATININE 2.06* 2.57* 2.26*  < > 1.61* 1.54* 1.71* 1.75* 1.52*  CALCIUM 9.3 9.2 8.8*  < > 8.5* 9.4 8.7* 8.6* 8.7*  MG 1.9 1.9 1.8  --   --  1.9  --   --   --   PHOS 3.0 1.9* 1.6*  --   --  <1.0* 2.4* 2.8  --   < > = values in this interval not displayed. GFR: Estimated Creatinine Clearance: 33.7 mL/min (by C-G formula based on SCr of 1.52 mg/dL (H)). Liver Function Tests:  Recent Labs Lab 08/24/16 0330 08/26/16 1117  AST 43* 34  ALT 55* 39  ALKPHOS 145* 129*  BILITOT 0.3 0.5  PROT 5.5* 6.1*  ALBUMIN 1.9* 2.5*   No results for input(s): LIPASE, AMYLASE in the last 168 hours. No results for input(s): AMMONIA in the last 168 hours. Coagulation Profile: No results for input(s): INR, PROTIME in the last 168 hours. Cardiac Enzymes: No results for input(s): CKTOTAL, CKMB, CKMBINDEX, TROPONINI in the last 168 hours. BNP (last 3 results) No results for input(s): PROBNP in the last 8760  hours. HbA1C: No results for input(s): HGBA1C in the last 72 hours. CBG:  Recent Labs Lab 08/27/16 2024 08/28/16 0016 08/28/16 0412 08/28/16 0737 08/28/16 1137  GLUCAP 152* 119* 160* 176* 242*   Lipid Profile: No results for input(s): CHOL, HDL, LDLCALC, TRIG, CHOLHDL, LDLDIRECT in the last 72 hours. Thyroid Function Tests: No results for input(s): TSH, T4TOTAL, FREET4, T3FREE, THYROIDAB in the last 72 hours. Anemia Panel: No results for input(s): VITAMINB12, FOLATE, FERRITIN, TIBC, IRON, RETICCTPCT in the last 72 hours. Urine analysis:    Component Value Date/Time   COLORURINE  YELLOW 08/26/2016 [place] 08/26/2016 1226   LABSPEC 1.012 08/26/2016 1226   PHURINE 8.0 08/26/2016 1226   GLUCOSEU NEGATIVE 08/26/2016 1226   HGBUR NEGATIVE 08/26/2016 [place] 08/26/2016 1226   KETONESUR NEGATIVE 08/26/2016 1226   PROTEINUR NEGATIVE 08/26/2016 1226   NITRITE NEGATIVE 08/26/2016 1226   LEUKOCYTESUR NEGATIVE 08/26/2016 1226   Sepsis Labs: _0 (procalcitonin:4,lacticidven:4)   ) Recent Results (from the past 240 hour(s))  Respiratory Panel by PCR     Status: None   Collection Time: 08/20/16  1:27 PM  Result Value Ref Range Status   Adenovirus NOT DETECTED NOT DETECTED Final   Coronavirus 229E NOT DETECTED NOT DETECTED Final   Coronavirus HKU1 NOT DETECTED NOT DETECTED Final   Coronavirus NL63 NOT DETECTED NOT DETECTED Final   Coronavirus OC43 NOT DETECTED NOT DETECTED Final   Metapneumovirus NOT DETECTED NOT DETECTED Final   Rhinovirus / Enterovirus NOT DETECTED NOT DETECTED Final   Influenza A NOT DETECTED NOT DETECTED Final   Influenza B NOT DETECTED NOT DETECTED Final   Parainfluenza Virus 1 NOT DETECTED NOT DETECTED Final   Parainfluenza Virus 2 NOT DETECTED NOT DETECTED Final   Parainfluenza Virus 3 NOT DETECTED NOT DETECTED Final   Parainfluenza Virus 4 NOT DETECTED NOT DETECTED Final   Respiratory Syncytial Virus NOT  DETECTED NOT DETECTED Final   Bordetella pertussis NOT DETECTED NOT DETECTED Final   Chlamydophila pneumoniae NOT DETECTED NOT DETECTED Final   Mycoplasma pneumoniae NOT DETECTED NOT DETECTED Final  Culture, Urine     Status: None   Collection Time: 08/26/16 12:26 PM  Result Value Ref Range Status   Specimen Description URINE, RANDOM  Final   Special Requests NONE  Final   Culture NO GROWTH  Final   Report Status 08/27/2016 FINAL  Final      Radiology Studies: Dg Chest Port 1 View Result Date: 08/26/2016 Mild persistent peribronchial airspace opacities in the left lower thorax. Mild pulmonary vascular congestion. Electronically Signed   By: Fidela Salisbury M.D.   On: 08/26/2016 13:06     Scheduled Meds: . amantadine  100 mg Per Tube BID  . ARIPiprazole  10 mg Per Tube QPM  . buPROPion  100 mg Per Tube Daily  . chlorhexidine gluconate (MEDLINE KIT)  15 mL Mouth Rinse BID  . clonazePAM  0.25 mg Per Tube BID  . enoxaparin (LOVENOX) injection  30 mg Subcutaneous Q24H  . free water  300 mL Per Tube Q4H  . gabapentin  200 mg Per Tube BID  . insulin aspart  0-15 Units Subcutaneous Q4H  . insulin glargine  12 Units Subcutaneous QHS  . levothyroxine  125 mcg Per Tube QAC breakfast  . mouth rinse  15 mL Mouth Rinse BID  . sodium chloride flush  10-40 mL Intracatheter Q12H  . vancomycin  1,000 mg Intravenous Q24H   Continuous Infusions: . sodium chloride 75 mL/hr at 08/27/16 2017  . feeding supplement (GLUCERNA 1.2 CAL) 1,000 mL (08/27/16 1530)     LOS: 11 days    Time spent: 25 minutes  Greater than 50% of the time spent on counseling and coordinating the care.   Leisa Lenz, MD [facility] Pager [phone]  If 7PM-7AM, please contact night-coverage [email or web address] Password [email or web address] 08/28/2016, 1:07 PM

## 2016-08-28 NOTE — Progress Notes (Signed)
OT Cancellation Note  Patient Details Name: [name] Rogowski MRN: [id number] DOB: 08/29/1951   Cancelled Treatment:    Reason Eval/Treat Not Completed: Patient at procedure or test/ unavailable. Will follow.  Evern BioMayberry, Onesty Clair Lynn 08/28/2016, 10:35 AM  [phone]

## 2016-08-28 NOTE — Progress Notes (Signed)
Pharmacy Antibiotic Note  Alexis Sanders is a 65 y.o. female admitted on 08/17/2016 with pneumonia.  Patient was originally started on ceftriaxone and azithromycin and s/p meropenem for HCAP.  Pt remains febrile today and pharmacy asked to begin vancomycin.  SCr has improved to 1.52 which is around patient's baseline. CrCl ~30-7835mL/min. Note WBC continues to rise slightly. Patient is only on vancomycin and has no gram negative coverage.   Plan:  - Continue Vancomycin 1g IV q24h - at edge of dosing interval, watch renal fxn for needed adjustments - Vancomycin goal trough 15-7420mcg/mL - F/u plans to reculture and add back Gram Negative coverage - Monitor fevers and potential source  Height: 5\' 3"  (160 cm) Weight: 145 lb 8 oz (66 kg) IBW/kg (Calculated) : 52.4  Temp (24hrs), Avg:98.4 F (36.9 C), Min:97.8 F (36.6 C), Max:99 F (37.2 C)   Recent Labs Lab 08/23/16 0445 08/24/16 0330 08/25/16 0604 08/26/16 1117 08/27/16 0511 08/28/16 0537  WBC 9.9 10.1 11.8* 12.3*  --  12.9*  CREATININE 2.05* 1.61* 1.54* 1.71* 1.75* 1.52*    Estimated Creatinine Clearance: 33.7 mL/min (by C-G formula based on SCr of 1.52 mg/dL (H)).    Allergies  Allergen Reactions  . Codeine Nausea Only  . Metformin Diarrhea  . Penicillins Rash    Has patient had a PCN reaction causing immediate rash, facial/tongue/throat swelling, SOB or lightheadedness with hypotension:Yes Has patient had a PCN reaction causing severe rash involving mucus membranes or skin necrosis:unsure. Has patient had a PCN reaction that required hospitalization:No Has patient had a PCN reaction occurring within the last 10 years:No If all of the above answers are "NO", then may proceed with Cephalosporin use. Has patient had a PCN reaction causing immediate rash, faci    Antimicrobials this admission: Ceftriaxone 10/30 >> 10/31 Azithromycin 10/30 >> 11/3 Primaxin 10/31 >> 11/3 Meropenem 11/3 > 11/8 Vanc 11/8 >>  Dose  adjustments this admission: N/A  Microbiology results 10/30 BCx: neg 10/30 UCx: neg  10/31 MRSA PCR: Pos 10/31 Resp panel: neg 10/31 BCx: neg 10/8 urine: neg  Pamla Pangle D. Tia Gelb, PharmD, BCPS Clinical Pharmacist Pager: [phone] 08/28/2016 1:54 PM

## 2016-08-28 NOTE — Progress Notes (Signed)
Modified Barium Swallow Progress Note  Patient Details  Name: [name] Rittenhouse MRN: [id number] Date of Birth: 04/12/1951  Today's Date: 08/28/2016  Modified Barium Swallow completed.  Full report located under Chart Review in the Imaging Section.  Brief recommendations include the following:  Clinical Impression  Pt presents with a mild oropharyngeal dysphagia [facility]. Lingual and labial tremors result in reduced bolus cohesion and delayed oral transit of purees with mild lingual residue after the swallow. A straw facilitates oral control. She has a consistent delay in swallow initiation, which when paired with decreased airway closure, results in mild but silent penetration with thin liquids that cannot be cleared despite cued coughing. Trials were kept limited due to pt reports of nausea and with dry heaving. Recommend Dys 1 diet and nectar thick liquids with possible potential for advancement with improvements in overall strength and respiratory function. Pt may be a good candidate for respiratory muscle strength training.   Swallow Evaluation Recommendations       SLP Diet Recommendations: Dysphagia 1 (Puree) solids;Nectar thick liquid   Liquid Administration via: Straw   Medication Administration: Crushed with puree   Supervision: Staff to assist with self feeding;Full supervision/cueing for compensatory strategies   Compensations: Slow rate;Small sips/bites   Postural Changes: Remain semi-upright after after feeds/meals (Comment);Seated upright at 90 degrees   Oral Care Recommendations: Oral care BID   Other Recommendations: Order thickener from pharmacy;Prohibited food (jello, ice cream, thin soups);Remove water pitcher    Maxcine Hamaiewonsky, Jeyden Coffelt 08/28/2016,11:56 AM   Maxcine HamLaura Paiewonsky, M.A. CCC-SLP [phone]

## 2016-08-29 DIAGNOSIS — E870 Hyperosmolality and hypernatremia: Secondary | ICD-10-CM

## 2016-08-29 LAB — BASIC METABOLIC PANEL
Anion gap: 9 (ref 5–15)
BUN: 28 mg/dL — AB (ref 6–20)
CHLORIDE: 114 mmol/L — AB (ref 101–111)
CO2: 21 mmol/L — AB (ref 22–32)
Calcium: 8.6 mg/dL — ABNORMAL LOW (ref 8.9–10.3)
Creatinine, Ser: 1.4 mg/dL — ABNORMAL HIGH (ref 0.44–1.00)
GFR calc non Af Amer: 38 mL/min — ABNORMAL LOW (ref >60)
GFR, EST AFRICAN AMERICAN: 45 mL/min — AB (ref >60)
Glucose, Bld: 196 mg/dL — ABNORMAL HIGH (ref 65–99)
POTASSIUM: 5.5 mmol/L — AB (ref 3.5–5.1)
SODIUM: 144 mmol/L (ref 135–145)

## 2016-08-29 LAB — CBC
HEMATOCRIT: 32.4 % — AB (ref 36.0–46.0)
HEMOGLOBIN: 10.1 g/dL — AB (ref 12.0–15.0)
MCH: 26.4 pg (ref 26.0–34.0)
MCHC: 31.2 g/dL (ref 30.0–36.0)
MCV: 84.6 fL (ref 78.0–100.0)
Platelets: 302 10*3/uL (ref 150–400)
RBC: 3.83 MIL/uL — AB (ref 3.87–5.11)
RDW: 15 % (ref 11.5–15.5)
WBC: 17.7 10*3/uL — ABNORMAL HIGH (ref 4.0–10.5)

## 2016-08-29 LAB — GLUCOSE, CAPILLARY
GLUCOSE-CAPILLARY: 170 mg/dL — AB (ref 65–99)
GLUCOSE-CAPILLARY: 182 mg/dL — AB (ref 65–99)
GLUCOSE-CAPILLARY: 185 mg/dL — AB (ref 65–99)
GLUCOSE-CAPILLARY: 196 mg/dL — AB (ref 65–99)
Glucose-Capillary: 205 mg/dL — ABNORMAL HIGH (ref 65–99)
Glucose-Capillary: 205 mg/dL — ABNORMAL HIGH (ref 65–99)

## 2016-08-29 MED ORDER — FUROSEMIDE 10 MG/ML IJ SOLN
10.0000 mg | Freq: Once | INTRAMUSCULAR | Status: AC
  Administered 2016-08-29: 10 mg via INTRAVENOUS
  Filled 2016-08-29: qty 2

## 2016-08-29 NOTE — Progress Notes (Signed)
Patient ID: Alexis Sanders, female   DOB: 03-Sep-1951, 65 y.o.   MRN: [id number]  PROGRESS NOTE    Alexis Sanders  XTA:[id number] DOB: 1951-08-03 DOA: 08/17/2016  PCP: Vicie Mutters, PA-C   Brief Narrative:   65 y.o.female with past medical history of parkinson's disease started on amantadine August 2017, DM2, Bipolar disorder, hypothyroidism who was brought to [facility] with confusion, tremors for 3-4 days prior to this admission. Patient also had reports of nonproductive cough. On admission, patient was febrile with chest x-ray showing bilateral infiltrates and UA consistent with UTI.  Significant Events: 10/29 seen at [facility] > given [place] 10/30 admitted [facility] 11/02 PCCM called 11/05 extubated 11/06 transfer to [place]:    Fever of unknown origin - No fevers in past 48 hours - Initially, the thought was that patient has possible pneumonia versus urinary tract infection - Patient was broadly covered with Zithromax, Primaxin, meropenem. Meropenem stopped 08/26/2016. Currently patient is on vancomycin which was started 08/26/2016 - CT abd/pelvis and chest 11/9 - bilateral patch airspace disease suggest multi pulmonary infection, no intra-abdominal finding to account for fever  - Respiratory virus panel unremarkable - Blood cultures showed no growth to date - Urine culture with multiple species, none predominant - Continue vanco  Sepsis due to Bibasilar aspiration PNA +/- UTI  - Continue vanco for now - Pneumonia seen on CT scan   Acute hypoxic respiratory failure secondary to aspiration pneumonitis - Intubated 11/3 - failed swallow eval - Stable respiratory status  AKI - Secondary to prerenal etiology, sepsis - Creatinine improving  Hypernatremia - Likely due to sepsis, volume depletion - Sodium now WNL  Hypokalemia - Supplemented and now potassium 5.5 - Will get 1 dose lasix 10 mg IV and repeat potassium in am  Hypophosphatemia -  Replaced  Diabetes mellitus, uncontrolled without complications without long-term insulin use - A1c 6.5 - Patient takes glipizide at home - Patient on Lantus 12 units at bedtime along with sliding scale insulin  Hypothyroidism - Continue levothyroxine 125 g daily  Drug induced Parkinson's disease - Continue amantadine   Bipolar disorder/Anxiety - Continue clonazepam, Wellbutrin, Abilify  Anemia of critical illness and chronic disease - Hemoglobin stable   Dysphagia / Severe protein calorie malnutrition  - Has NG tube - Per SLP - dysphagia diet as of 11/10   DVT prophylaxis: Lovenox subQ Code Status: full code  Family Communication: no family at the bedside this am  Disposition Plan: not yet stable for discharge, hope by 11/13   Consultants:   PCCM  Palliative care   Procedures:   Intubation    Antimicrobials:  Rocephin 10/30 Zithromax 10/30 > 11/02 Primaxin 10/31 >11/02  Meropenem 11/03 >11/8 Vancomycin 11/08 -->   Subjective: No overnight events.   Objective: Vitals:   08/28/16 1654 08/28/16 2210 08/29/16 0447 08/29/16 0939  BP: 132/73 (!) 127/93 103/70   Pulse: 72 79 80 82  Resp: '18 16 18 18  ' Temp: 98 F (36.7 C) 98.2 F (36.8 C) 98.6 F (37 C) 98.6 F (37 C)  TempSrc: Oral Oral Oral Axillary  SpO2: 98% 94% 93% 98%  Weight:      Height:        Intake/Output Summary (Last 24 hours) at 08/29/16 1428 Last data filed at 08/29/16 1156  Gross per 24 hour  Intake          3791.25 ml  Output  401 ml  Net          3390.25 ml   Filed Weights   08/26/16 0425 08/26/16 2131 08/28/16 0604  Weight: 67.1 kg (148 lb) 67.3 kg (148 lb 5.9 oz) 66 kg (145 lb 8 oz)    Examination:  General exam: No acute distress, calm Respiratory system: No wheezing, no rhonchi Cardiovascular system: S1 & S2 heard, RRR Gastrointestinal system: (+) BS, non tender Central nervous system: nonfocal  Extremities: no edema, palpable pulses  Skin: no  lesions, no ulcers  Psychiatry: No agitation, no restlessness   Data Reviewed: I have personally reviewed following labs and imaging studies  CBC:  Recent Labs Lab 08/24/16 0330 08/25/16 0604 08/26/16 1117 08/28/16 0537 08/29/16 0607  WBC 10.1 11.8* 12.3* 12.9* 17.7*  HGB 9.4* 11.1* 10.3* 10.8* 10.1*  HCT 30.7* 35.9* 33.2* 35.1* 32.4*  MCV 84.6 83.9 84.9 84.6 84.6  PLT 218 296 280 245 101   Basic Metabolic Panel:  Recent Labs Lab 08/22/16 1630  08/25/16 0604 08/26/16 1117 08/27/16 0511 08/28/16 0537 08/29/16 0607  NA 145  < > 149* 154* 147* 144 144  K 3.7  < > 3.5 3.8 4.2 4.0 5.5*  CL 111  < > 116* 120* 119* 114* 114*  CO2 27  < > 25 23 21* 21* 21*  GLUCOSE 217*  < > 275* 135* 169* 180* 196*  BUN 62*  < > 42* 34* 33* 32* 28*  CREATININE 2.26*  < > 1.54* 1.71* 1.75* 1.52* 1.40*  CALCIUM 8.8*  < > 9.4 8.7* 8.6* 8.7* 8.6*  MG 1.8  --  1.9  --   --   --   --   PHOS 1.6*  --  <1.0* 2.4* 2.8  --   --   < > = values in this interval not displayed. GFR: Estimated Creatinine Clearance: 36.6 mL/min (by C-G formula based on SCr of 1.4 mg/dL (H)). Liver Function Tests:  Recent Labs Lab 08/24/16 0330 08/26/16 1117  AST 43* 34  ALT 55* 39  ALKPHOS 145* 129*  BILITOT 0.3 0.5  PROT 5.5* 6.1*  ALBUMIN 1.9* 2.5*   No results for input(s): LIPASE, AMYLASE in the last 168 hours. No results for input(s): AMMONIA in the last 168 hours. Coagulation Profile: No results for input(s): INR, PROTIME in the last 168 hours. Cardiac Enzymes: No results for input(s): CKTOTAL, CKMB, CKMBINDEX, TROPONINI in the last 168 hours. BNP (last 3 results) No results for input(s): PROBNP in the last 8760 hours. HbA1C: No results for input(s): HGBA1C in the last 72 hours. CBG:  Recent Labs Lab 08/28/16 2010 08/29/16 0037 08/29/16 0417 08/29/16 0745 08/29/16 1128  GLUCAP 183* 185* 182* 196* 205*   Lipid Profile: No results for input(s): CHOL, HDL, LDLCALC, TRIG, CHOLHDL, LDLDIRECT in  the last 72 hours. Thyroid Function Tests: No results for input(s): TSH, T4TOTAL, FREET4, T3FREE, THYROIDAB in the last 72 hours. Anemia Panel: No results for input(s): VITAMINB12, FOLATE, FERRITIN, TIBC, IRON, RETICCTPCT in the last 72 hours. Urine analysis:    Component Value Date/Time   COLORURINE YELLOW 08/26/2016 1226   APPEARANCEUR CLEAR 08/26/2016 1226   LABSPEC 1.012 08/26/2016 1226   PHURINE 8.0 08/26/2016 1226   GLUCOSEU NEGATIVE 08/26/2016 1226   HGBUR NEGATIVE 08/26/2016 1226   BILIRUBINUR NEGATIVE 08/26/2016 1226   KETONESUR NEGATIVE 08/26/2016 1226   PROTEINUR NEGATIVE 08/26/2016 1226   NITRITE NEGATIVE 08/26/2016 1226   LEUKOCYTESUR NEGATIVE 08/26/2016 1226   Sepsis Labs: '@LABRCNTIP' (procalcitonin:4,lacticidven:4)   )  Recent Results (from the past 240 hour(s))  Respiratory Panel by PCR     Status: None   Collection Time: 08/20/16  1:27 PM  Result Value Ref Range Status   Adenovirus NOT DETECTED NOT DETECTED Final   Coronavirus 229E NOT DETECTED NOT DETECTED Final   Coronavirus HKU1 NOT DETECTED NOT DETECTED Final   Coronavirus NL63 NOT DETECTED NOT DETECTED Final   Coronavirus OC43 NOT DETECTED NOT DETECTED Final   Metapneumovirus NOT DETECTED NOT DETECTED Final   Rhinovirus / Enterovirus NOT DETECTED NOT DETECTED Final   Influenza A NOT DETECTED NOT DETECTED Final   Influenza B NOT DETECTED NOT DETECTED Final   Parainfluenza Virus 1 NOT DETECTED NOT DETECTED Final   Parainfluenza Virus 2 NOT DETECTED NOT DETECTED Final   Parainfluenza Virus 3 NOT DETECTED NOT DETECTED Final   Parainfluenza Virus 4 NOT DETECTED NOT DETECTED Final   Respiratory Syncytial Virus NOT DETECTED NOT DETECTED Final   Bordetella pertussis NOT DETECTED NOT DETECTED Final   Chlamydophila pneumoniae NOT DETECTED NOT DETECTED Final   Mycoplasma pneumoniae NOT DETECTED NOT DETECTED Final  Culture, Urine     Status: None   Collection Time: 08/26/16 12:26 PM  Result Value Ref Range  Status   Specimen Description URINE, RANDOM  Final   Special Requests NONE  Final   Culture NO GROWTH  Final   Report Status 08/27/2016 FINAL  Final      Radiology Studies: Dg Chest Port 1 View Result Date: 08/26/2016 Mild persistent peribronchial airspace opacities in the left lower thorax. Mild pulmonary vascular congestion. Electronically Signed   By: Fidela Salisbury M.D.   On: 08/26/2016 13:06     Scheduled Meds: . amantadine  100 mg Per Tube BID  . ARIPiprazole  10 mg Per Tube QPM  . buPROPion  100 mg Per Tube Daily  . chlorhexidine gluconate (MEDLINE KIT)  15 mL Mouth Rinse BID  . clonazePAM  0.25 mg Per Tube BID  . enoxaparin (LOVENOX) injection  30 mg Subcutaneous Q24H  . free water  300 mL Per Tube Q4H  . gabapentin  200 mg Per Tube BID  . insulin aspart  0-15 Units Subcutaneous Q4H  . insulin glargine  12 Units Subcutaneous QHS  . levothyroxine  125 mcg Per Tube QAC breakfast  . mouth rinse  15 mL Mouth Rinse BID  . sodium chloride flush  10-40 mL Intracatheter Q12H  . vancomycin  1,000 mg Intravenous Q24H   Continuous Infusions: . sodium chloride 75 mL/hr at 08/27/16 2017  . feeding supplement (GLUCERNA 1.2 CAL) 1,000 mL (08/29/16 0229)     LOS: 12 days    Time spent: 15 minutes  Greater than 50% of the time spent on counseling and coordinating the care.   Leisa Lenz, MD [facility] Pager [phone]  If 7PM-7AM, please contact night-coverage [email or web address] Password [email or web address] 08/29/2016, 2:28 PM

## 2016-08-29 NOTE — Plan of Care (Signed)
Problem: Education: Goal: Knowledge of [facility] Education information/materials will improve Outcome: Progressing POC reviewed with pt.   

## 2016-08-29 NOTE — Progress Notes (Signed)
Pt. Stated to NT that she wasn't feeling good and shortly afterwards vomited 300-400cc brownish liquid vomitus; TF stopped; ready to give Zofran and IV site (L) FA leaking and consult put in to IV team for new site. Text paged M. Burnadette PeterLynch, NP and RTC [id number] and stated to hold tube feed for 2hrs.; pt. informed; NT to clean pt. up. Will give Zofran once new IV in.

## 2016-08-30 LAB — VANCOMYCIN, TROUGH: VANCOMYCIN TR: 22 ug/mL — AB (ref 15–20)

## 2016-08-30 LAB — CBC
HEMATOCRIT: 32.9 % — AB (ref 36.0–46.0)
HEMOGLOBIN: 10.3 g/dL — AB (ref 12.0–15.0)
MCH: 26.4 pg (ref 26.0–34.0)
MCHC: 31.3 g/dL (ref 30.0–36.0)
MCV: 84.4 fL (ref 78.0–100.0)
Platelets: 289 10*3/uL (ref 150–400)
RBC: 3.9 MIL/uL (ref 3.87–5.11)
RDW: 15.5 % (ref 11.5–15.5)
WBC: 16.9 10*3/uL — ABNORMAL HIGH (ref 4.0–10.5)

## 2016-08-30 LAB — BASIC METABOLIC PANEL
Anion gap: 8 (ref 5–15)
BUN: 25 mg/dL — AB (ref 6–20)
CHLORIDE: 112 mmol/L — AB (ref 101–111)
CO2: 22 mmol/L (ref 22–32)
CREATININE: 1.36 mg/dL — AB (ref 0.44–1.00)
Calcium: 8.8 mg/dL — ABNORMAL LOW (ref 8.9–10.3)
GFR calc Af Amer: 46 mL/min — ABNORMAL LOW (ref >60)
GFR calc non Af Amer: 40 mL/min — ABNORMAL LOW (ref >60)
Glucose, Bld: 189 mg/dL — ABNORMAL HIGH (ref 65–99)
POTASSIUM: 4.8 mmol/L (ref 3.5–5.1)
Sodium: 142 mmol/L (ref 135–145)

## 2016-08-30 LAB — GLUCOSE, CAPILLARY
GLUCOSE-CAPILLARY: 151 mg/dL — AB (ref 65–99)
GLUCOSE-CAPILLARY: 145 mg/dL — AB (ref 65–99)
GLUCOSE-CAPILLARY: 145 mg/dL — AB (ref 65–99)
Glucose-Capillary: 130 mg/dL — ABNORMAL HIGH (ref 65–99)
Glucose-Capillary: 155 mg/dL — ABNORMAL HIGH (ref 65–99)
Glucose-Capillary: 196 mg/dL — ABNORMAL HIGH (ref 65–99)

## 2016-08-30 MED ORDER — GI COCKTAIL ~~LOC~~
30.0000 mL | Freq: Once | ORAL | Status: AC
  Administered 2016-08-30: 30 mL via ORAL
  Filled 2016-08-30: qty 30

## 2016-08-30 MED ORDER — VANCOMYCIN HCL IN DEXTROSE 750-5 MG/150ML-% IV SOLN
750.0000 mg | INTRAVENOUS | Status: DC
  Administered 2016-08-30 – 2016-08-31 (×2): 750 mg via INTRAVENOUS
  Filled 2016-08-30 (×3): qty 150

## 2016-08-30 NOTE — Progress Notes (Signed)
Physical Therapy Treatment Patient Details Name: Alexis Sanders MRN: [id number] DOB: 12/02/1950 Today's Date: 08/30/2016    History of Present Illness Pt admitted with hypoxic respiratory failure from aspiration. Intubated 11/3-11/5. PMH: Parkinsons, DM, Bipolar.    PT Comments    Pt was seen for visit for the first time in several days, did assist PT to sit side of bed and used LUE to shift pressure to that side as she lists significantly to the R.  Her ongoing plan is to get inpt rehab as she has an extended family member who is her person at home and will need to be more capable to succeed there.  Continue acutely to increase strength generally but especially to tolerate and control trunk to sit in a chair and to increase her ability to help control her LE's.  Follow Up Recommendations  SNF     Equipment Recommendations   (TBD by SNF rehab staff)    Recommendations for Other Services       Precautions / Restrictions Precautions Precautions: Fall (telemetry) Precaution Comments: NG tube precautions Restrictions Weight Bearing Restrictions: No    Mobility  Bed Mobility Overal bed mobility: Needs Assistance Bed Mobility: Supine to Sit;Sit to Supine     Supine to sit: Max assist Sit to supine: Max assist   General bed mobility comments: pt is using abdominal mm's to assist mobilty with PT   Transfers Overall transfer level: Needs assistance               General transfer comment: total assist and pt declines to be up for now  Ambulation/Gait             General Gait Details: unable to attempt   Stairs            Wheelchair Mobility    Modified Rankin (Stroke Patients Only)       Balance   Sitting-balance support: Feet supported Sitting balance-Leahy Scale: Poor                              Cognition Arousal/Alertness: Awake/alert Behavior During Therapy: Flat affect Overall Cognitive Status: No family/caregiver present to  determine baseline cognitive functioning Area of Impairment: Safety/judgement;Awareness;Problem solving;Attention   Current Attention Level: Selective Memory: Decreased short-term memory;Decreased recall of precautions Following Commands: Follows one step commands with increased time;Follows one step commands inconsistently Safety/Judgement: Decreased awareness of safety;Decreased awareness of deficits Awareness: Intellectual Problem Solving: Slow processing;Requires verbal cues;Requires tactile cues      Exercises General Exercises - Lower Extremity Ankle Circles/Pumps: PROM;Both;10 reps Short Arc Quad: PROM;Both;10 reps    General Comments        Pertinent Vitals/Pain Pain Assessment: Faces Pain Score: 4  Faces Pain Scale: Hurts little more Pain Location: abdoment with first work on mobility Pain Intervention(s): Monitored during session;Repositioned (gas complaints but is having loose diarrhea)    Home Living                      Prior Function            PT Goals (current goals can now be found in the care plan section) Acute Rehab PT Goals Patient Stated Goal: to get comfortable with her stomach PT Goal Formulation: With patient Progress towards PT goals: Progressing toward goals    Frequency    Min 2X/week      PT Plan Current plan remains appropriate  Co-evaluation             End of Session Equipment Utilized During Treatment: Oxygen Activity Tolerance: Patient tolerated treatment well Patient left: in bed;with call bell/phone within reach;with bed alarm set     Time: [id number] PT Time Calculation (min) (ACUTE ONLY): 20 min  Charges:  $Therapeutic Activity: 8-22 mins                    G Codes:      Ivar DrapeStout, Donalda Job E 08/30/2016, 10:06 AM    Samul Dadauth Colby Catanese, PT MS Acute Rehab Dept. Number: [facility] [id number] and [facility] [phone]

## 2016-08-30 NOTE — Progress Notes (Signed)
Pharmacy Antibiotic Note  Alexis Sanders is a 65 y.o. female admitted on 08/17/2016 with pneumonia.  Patient was originally started on ceftriaxone and azithromycin and s/p meropenem for HCAP.  Pt remains febrile today and pharmacy asked to begin vancomycin.  SCr has improved to 1.36 which is around patient's baseline. CrCl ~30-4035mL/min. Patient is only on vancomycin and has no gram negative coverage. Of note patient is afebrile for past 48 hours, but WBC remains elevated despite supra-therapeutic vancomycin levels.   Plan:  - Reduce vancomycin to 750 mg IV q24h - Vancomycin goal trough 15-4220mcg/mL - F/u plans to reculture and add back Gram Negative coverage - Monitor fevers and potential source  Height: 5\' 3"  (160 cm) Weight: 150 lb 1.6 oz (68.1 kg) IBW/kg (Calculated) : 52.4  Temp (24hrs), Avg:99 F (37.2 C), Min:98.4 F (36.9 C), Max:99.8 F (37.7 C)   Recent Labs Lab 08/25/16 0604 08/26/16 1117 08/27/16 0511 08/28/16 0537 08/29/16 0607 08/30/16 0526 08/30/16 1523  WBC 11.8* 12.3*  --  12.9* 17.7* 16.9*  --   CREATININE 1.54* 1.71* 1.75* 1.52* 1.40* 1.36*  --   VANCOTROUGH  --   --   --   --   --   --  22*    Estimated Creatinine Clearance: 38.2 mL/min (by C-G formula based on SCr of 1.36 mg/dL (H)).    Allergies  Allergen Reactions  . Codeine Nausea Only  . Metformin Diarrhea  . Penicillins Rash    Has patient had a PCN reaction causing immediate rash, facial/tongue/throat swelling, SOB or lightheadedness with hypotension:Yes Has patient had a PCN reaction causing severe rash involving mucus membranes or skin necrosis:unsure. Has patient had a PCN reaction that required hospitalization:No Has patient had a PCN reaction occurring within the last 10 years:No If all of the above answers are "NO", then may proceed with Cephalosporin use. Has patient had a PCN reaction causing immediate rash, faci    Antimicrobials this admission: Ceftriaxone 10/30 >>  10/31 Azithromycin 10/30 >> 11/3 Primaxin 10/31 >> 11/3 Meropenem 11/3 > 11/8 Vanc 11/8 >>  Dose adjustments this admission: N/A  Microbiology results 10/30 BCx: neg 10/30 UCx: neg  10/31 MRSA PCR: Pos 10/31 Resp panel: neg 10/31 BCx: neg 10/8 urine: neg  Ruben Imony Kaesha Kirsch, PharmD Clinical Pharmacist Pager: [phone] 08/30/2016 4:42 PM

## 2016-08-30 NOTE — Progress Notes (Addendum)
Patient ID: Alexis Sanders, female   DOB: September 04, 1951, 65 y.o.   MRN: [id number]  PROGRESS NOTE    Alyona Romack Rahm  EXB:[id number] DOB: [birth year] DOA: 08/17/2016  PCP: Vicie Mutters, PA-C   Brief Narrative:   65 y.o.female with past medical history of parkinson's disease started on amantadine August 2017, DM2, Bipolar disorder, hypothyroidism who was brought to [facility] with confusion, tremors for 3-4 days prior to this admission. Patient also had reports of nonproductive cough. On admission, patient was febrile with chest x-ray showing bilateral infiltrates and UA consistent with UTI.  Significant Events: 10/29 seen at [facility] > given [place] 10/30 admitted [facility] 11/02 PCCM called 11/05 extubated 11/06 transfer to [place]:    Fever of unknown origin - No fevers in past 72 hours - Initially, the thought was that patient has possible pneumonia versus urinary tract infection - Patient was broadly covered with Zithromax, Primaxin, meropenem. Meropenem stopped 08/26/2016. Currently patient is on vancomycin which was started 08/26/2016 - CT abd/pelvis and chest 11/9 - bilateral patch airspace disease suggest multi pulmonary infection, no intra-abdominal finding to account for fever  - Respiratory virus panel unremarkable - Blood cultures showed no growth to date - Urine culture with multiple species, none predominant - Continue vanco for now  Sepsis due to Bibasilar aspiration PNA +/- UTI  - Continue vanco for now - Pneumonia seen on CT scan   Acute hypoxic respiratory failure secondary to aspiration pneumonitis - Intubated 11/3 - failed swallow eval but repeat eval 11/10 - puree type food apparently tolerated well by pt  - Stable respiratory status  AKI - Secondary to prerenal etiology, sepsis - Creatinine improving, 1.52 --> 1.36  Hypernatremia - Likely due to sepsis, volume depletion - Sodium now WNL  Hypokalemia / Hyperkalemia  - Supplemented  and now potassium 5.5  Has gotten 1 dose lasix 10 mg IV 11/11 and potassium now WNL  Hypophosphatemia - Replaced  Diabetes mellitus, uncontrolled without complications without long-term insulin use - A1c 6.5 - Patient takes glipizide at home - Patient on Lantus 12 units at bedtime along with sliding scale insulin - CBG's in past 24 hours: 151, 155, 145  Hypothyroidism - Continue levothyroxine 125 g daily  Drug induced Parkinson's disease - Continue amantadine   Bipolar disorder/Anxiety - Continue clonazepam, Wellbutrin, Abilify  Anemia of critical illness and chronic disease - Hemoglobin stable   Dysphagia / Severe protein calorie malnutrition  - Per SLP - dysphagia 1 diet as of 11/10   DVT prophylaxis: Lovenox subQ Code Status: full code  Family Communication: no family at the bedside this am; called pt son over the phone, left my cell number to call me back for updates  Disposition Plan: not yet stable for discharge, hope by 11/13 to SNF   Consultants:   PCCM  Palliative care   SLP  Procedures:   Intubation    Antimicrobials:  Rocephin 10/30 Zithromax 10/30 > 11/02 Primaxin 10/31 >11/02  Meropenem 11/03 >11/8 Vancomycin 11/08 -->   Subjective: No overnight events.   Objective: Vitals:   08/29/16 2209 08/30/16 0611 08/30/16 0635 08/30/16 1000  BP: (!) 151/74 (!) 159/60 (!) 157/77 127/76  Pulse: 73 60 79 87  Resp: '18 17 18 18  ' Temp: 99.8 F (37.7 C) 98.4 F (36.9 C) 99.2 F (37.3 C) 99.1 F (37.3 C)  TempSrc: Oral Oral Oral Oral  SpO2: 94% 98% 97% 98%  Weight: 68.1 kg (150 lb 1.6 oz)  Height:        Intake/Output Summary (Last 24 hours) at 08/30/16 1445 Last data filed at 08/30/16 1200  Gross per 24 hour  Intake             4085 ml  Output                0 ml  Net             4085 ml   Filed Weights   08/26/16 2131 08/28/16 0604 08/29/16 2209  Weight: 67.3 kg (148 lb 5.9 oz) 66 kg (145 lb 8 oz) 68.1 kg (150 lb 1.6 oz)     Examination:  General exam: No acute distress, calm and comfortable  Respiratory system: No wheezing, no rhonchi, bilateral air entry  Cardiovascular system: S1 & S2 heard, Rate controlled  Gastrointestinal system: (+) BS, non tender, non distended  Central nervous system: nonfocal  Extremities: no edema, palpable pulses bilaterally  Skin: no lesions, no ulcers  Psychiatry: normal mood, disoriented   Data Reviewed: I have personally reviewed following labs and imaging studies  CBC:  Recent Labs Lab 08/25/16 0604 08/26/16 1117 08/28/16 0537 08/29/16 0607 08/30/16 0526  WBC 11.8* 12.3* 12.9* 17.7* 16.9*  HGB 11.1* 10.3* 10.8* 10.1* 10.3*  HCT 35.9* 33.2* 35.1* 32.4* 32.9*  MCV 83.9 84.9 84.6 84.6 84.4  PLT 296 280 245 302 742   Basic Metabolic Panel:  Recent Labs Lab 08/25/16 0604 08/26/16 1117 08/27/16 0511 08/28/16 0537 08/29/16 0607 08/30/16 0526  NA 149* 154* 147* 144 144 142  K 3.5 3.8 4.2 4.0 5.5* 4.8  CL 116* 120* 119* 114* 114* 112*  CO2 25 23 21* 21* 21* 22  GLUCOSE 275* 135* 169* 180* 196* 189*  BUN 42* 34* 33* 32* 28* 25*  CREATININE 1.54* 1.71* 1.75* 1.52* 1.40* 1.36*  CALCIUM 9.4 8.7* 8.6* 8.7* 8.6* 8.8*  MG 1.9  --   --   --   --   --   PHOS <1.0* 2.4* 2.8  --   --   --    GFR: Estimated Creatinine Clearance: 38.2 mL/min (by C-G formula based on SCr of 1.36 mg/dL (H)). Liver Function Tests:  Recent Labs Lab 08/24/16 0330 08/26/16 1117  AST 43* 34  ALT 55* 39  ALKPHOS 145* 129*  BILITOT 0.3 0.5  PROT 5.5* 6.1*  ALBUMIN 1.9* 2.5*   No results for input(s): LIPASE, AMYLASE in the last 168 hours. No results for input(s): AMMONIA in the last 168 hours. Coagulation Profile: No results for input(s): INR, PROTIME in the last 168 hours. Cardiac Enzymes: No results for input(s): CKTOTAL, CKMB, CKMBINDEX, TROPONINI in the last 168 hours. BNP (last 3 results) No results for input(s): PROBNP in the last 8760 hours. HbA1C: No results for  input(s): HGBA1C in the last 72 hours. CBG:  Recent Labs Lab 08/29/16 2003 08/30/16 0027 08/30/16 0418 08/30/16 0718 08/30/16 1133  GLUCAP 170* 130* 151* 155* 145*   Lipid Profile: No results for input(s): CHOL, HDL, LDLCALC, TRIG, CHOLHDL, LDLDIRECT in the last 72 hours. Thyroid Function Tests: No results for input(s): TSH, T4TOTAL, FREET4, T3FREE, THYROIDAB in the last 72 hours. Anemia Panel: No results for input(s): VITAMINB12, FOLATE, FERRITIN, TIBC, IRON, RETICCTPCT in the last 72 hours. Urine analysis:    Component Value Date/Time   COLORURINE YELLOW 08/26/2016 1226   APPEARANCEUR CLEAR 08/26/2016 1226   LABSPEC 1.012 08/26/2016 1226   PHURINE 8.0 08/26/2016 1226   GLUCOSEU NEGATIVE 08/26/2016 1226  HGBUR NEGATIVE 08/26/2016 [place] 08/26/2016 [place] 08/26/2016 1226   PROTEINUR NEGATIVE 08/26/2016 1226   NITRITE NEGATIVE 08/26/2016 1226   LEUKOCYTESUR NEGATIVE 08/26/2016 1226   Sepsis Labs: '@LABRCNTIP' (procalcitonin:4,lacticidven:4)   ) Recent Results (from the past 240 hour(s))  Culture, Urine     Status: None   Collection Time: 08/26/16 12:26 PM  Result Value Ref Range Status   Specimen Description URINE, RANDOM  Final   Special Requests NONE  Final   Culture NO GROWTH  Final   Report Status 08/27/2016 FINAL  Final      Radiology Studies: Dg Chest Port 1 View Result Date: 08/26/2016 Mild persistent peribronchial airspace opacities in the left lower thorax. Mild pulmonary vascular congestion. Electronically Signed   By: Fidela Salisbury M.D.   On: 08/26/2016 13:06     Scheduled Meds: . amantadine  100 mg Per Tube BID  . ARIPiprazole  10 mg Per Tube QPM  . buPROPion  100 mg Per Tube Daily  . chlorhexidine gluconate (MEDLINE KIT)  15 mL Mouth Rinse BID  . clonazePAM  0.25 mg Per Tube BID  . enoxaparin (LOVENOX) injection  30 mg Subcutaneous Q24H  . free water  300 mL Per Tube Q4H  . gabapentin  200 mg Per  Tube BID  . insulin aspart  0-15 Units Subcutaneous Q4H  . insulin glargine  12 Units Subcutaneous QHS  . levothyroxine  125 mcg Per Tube QAC breakfast  . mouth rinse  15 mL Mouth Rinse BID  . sodium chloride flush  10-40 mL Intracatheter Q12H  . vancomycin  1,000 mg Intravenous Q24H   Continuous Infusions: . sodium chloride 75 mL/hr at 08/30/16 0533  . feeding supplement (GLUCERNA 1.2 CAL) 1,000 mL (08/30/16 1408)     LOS: 13 days    Time spent: 15 minutes  Greater than 50% of the time spent on counseling and coordinating the care.   Leisa Lenz, MD [facility] Pager [phone]  If 7PM-7AM, please contact night-coverage [email or web address] Password [email or web address] 08/30/2016, 2:45 PM

## 2016-08-30 NOTE — Progress Notes (Signed)
Pt son stated pt was requesting indigestion medicine, MD paged.

## 2016-08-30 NOTE — Progress Notes (Signed)
Vancomycin trough 22. Kathlene NovemberMike, pharmacist notified.

## 2016-08-31 LAB — GLUCOSE, CAPILLARY
GLUCOSE-CAPILLARY: 132 mg/dL — AB (ref 65–99)
GLUCOSE-CAPILLARY: 140 mg/dL — AB (ref 65–99)
Glucose-Capillary: 133 mg/dL — ABNORMAL HIGH (ref 65–99)
Glucose-Capillary: 124 mg/dL — ABNORMAL HIGH (ref 65–99)
Glucose-Capillary: 135 mg/dL — ABNORMAL HIGH (ref 65–99)
Glucose-Capillary: 137 mg/dL — ABNORMAL HIGH (ref 65–99)

## 2016-08-31 LAB — BASIC METABOLIC PANEL
Anion gap: 9 (ref 5–15)
BUN: 24 mg/dL — ABNORMAL HIGH (ref 6–20)
CALCIUM: 8.8 mg/dL — AB (ref 8.9–10.3)
CO2: 23 mmol/L (ref 22–32)
CREATININE: 1.33 mg/dL — AB (ref 0.44–1.00)
Chloride: 110 mmol/L (ref 101–111)
GFR calc non Af Amer: 41 mL/min — ABNORMAL LOW (ref >60)
GFR, EST AFRICAN AMERICAN: 47 mL/min — AB (ref >60)
Glucose, Bld: 143 mg/dL — ABNORMAL HIGH (ref 65–99)
Potassium: 5 mmol/L (ref 3.5–5.1)
SODIUM: 142 mmol/L (ref 135–145)

## 2016-08-31 LAB — CBC
HCT: 34.6 % — ABNORMAL LOW (ref 36.0–46.0)
Hemoglobin: 10.9 g/dL — ABNORMAL LOW (ref 12.0–15.0)
MCH: 26.5 pg (ref 26.0–34.0)
MCHC: 31.5 g/dL (ref 30.0–36.0)
MCV: 84.2 fL (ref 78.0–100.0)
PLATELETS: 347 10*3/uL (ref 150–400)
RBC: 4.11 MIL/uL (ref 3.87–5.11)
RDW: 15.5 % (ref 11.5–15.5)
WBC: 17.9 10*3/uL — ABNORMAL HIGH (ref 4.0–10.5)

## 2016-08-31 MED ORDER — BUPROPION HCL 100 MG PO TABS
100.0000 mg | ORAL_TABLET | Freq: Every day | ORAL | Status: DC
  Administered 2016-09-01 – 2016-09-04 (×4): 100 mg via ORAL
  Filled 2016-08-31 (×5): qty 1

## 2016-08-31 MED ORDER — INSULIN ASPART 100 UNIT/ML ~~LOC~~ SOLN
0.0000 [IU] | SUBCUTANEOUS | Status: DC
  Administered 2016-08-31 – 2016-09-01 (×4): 1 [IU] via SUBCUTANEOUS
  Administered 2016-09-01 – 2016-09-02 (×2): 2 [IU] via SUBCUTANEOUS
  Administered 2016-09-02 (×2): 1 [IU] via SUBCUTANEOUS
  Administered 2016-09-02 – 2016-09-04 (×3): 2 [IU] via SUBCUTANEOUS

## 2016-08-31 MED ORDER — LEVOTHYROXINE SODIUM 25 MCG PO TABS
125.0000 ug | ORAL_TABLET | Freq: Every day | ORAL | Status: DC
  Administered 2016-09-01 – 2016-09-04 (×5): 125 ug via ORAL
  Filled 2016-08-31 (×6): qty 1

## 2016-08-31 MED ORDER — CLONAZEPAM 0.5 MG PO TABS
0.2500 mg | ORAL_TABLET | Freq: Two times a day (BID) | ORAL | Status: DC
  Administered 2016-08-31 – 2016-09-03 (×6): 0.25 mg via ORAL
  Administered 2016-09-03: 0.5 mg via ORAL
  Filled 2016-08-31 (×8): qty 1

## 2016-08-31 MED ORDER — PANTOPRAZOLE SODIUM 40 MG PO PACK
40.0000 mg | PACK | Freq: Every day | ORAL | Status: DC
  Administered 2016-09-01 – 2016-09-04 (×5): 40 mg via ORAL
  Filled 2016-08-31 (×5): qty 20

## 2016-08-31 MED ORDER — PANTOPRAZOLE SODIUM 40 MG PO PACK
40.0000 mg | PACK | Freq: Every day | ORAL | Status: DC
  Administered 2016-08-31: 40 mg
  Filled 2016-08-31: qty 20

## 2016-08-31 MED ORDER — INSULIN ASPART 100 UNIT/ML ~~LOC~~ SOLN: 0.0000 [IU] | SUBCUTANEOUS | Status: DC

## 2016-08-31 MED ORDER — ARIPIPRAZOLE 5 MG PO TABS
10.0000 mg | ORAL_TABLET | Freq: Every evening | ORAL | Status: DC
  Administered 2016-08-31 – 2016-09-03 (×4): 10 mg via ORAL
  Filled 2016-08-31 (×4): qty 2

## 2016-08-31 MED ORDER — ENOXAPARIN SODIUM 40 MG/0.4ML ~~LOC~~ SOLN
40.0000 mg | SUBCUTANEOUS | Status: DC
  Administered 2016-08-31 – 2016-09-04 (×5): 40 mg via SUBCUTANEOUS
  Filled 2016-08-31 (×5): qty 0.4

## 2016-08-31 MED ORDER — PROMETHAZINE HCL 25 MG/ML IJ SOLN
12.5000 mg | Freq: Once | INTRAMUSCULAR | Status: AC
  Administered 2016-08-31: 12.5 mg via INTRAVENOUS
  Filled 2016-08-31: qty 1

## 2016-08-31 MED ORDER — GABAPENTIN 250 MG/5ML PO SOLN
200.0000 mg | Freq: Two times a day (BID) | ORAL | Status: DC
  Administered 2016-08-31 – 2016-09-04 (×7): 200 mg via ORAL
  Filled 2016-08-31 (×11): qty 4

## 2016-08-31 MED ORDER — AMANTADINE HCL 100 MG PO CAPS
100.0000 mg | ORAL_CAPSULE | Freq: Two times a day (BID) | ORAL | Status: DC
  Administered 2016-08-31 – 2016-09-04 (×8): 100 mg via ORAL
  Filled 2016-08-31 (×12): qty 1

## 2016-08-31 MED ORDER — PANTOPRAZOLE SODIUM 40 MG PO TBEC: 40.0000 mg | DELAYED_RELEASE_TABLET | Freq: Every day | ORAL | Status: DC

## 2016-08-31 MED ORDER — PANTOPRAZOLE SODIUM 40 MG PO PACK: 40.0000 mg | PACK | Freq: Every day | ORAL | Status: DC

## 2016-08-31 NOTE — Progress Notes (Signed)
Patient ID: Alexis Sanders, female   DOB: 11-14-1950, 65 y.o.   MRN: [id number]  PROGRESS NOTE    Dudley Cooley Cardell  WUJ:[id number] DOB: September 11, 1951 DOA: 08/17/2016  PCP: Vicie Mutters, PA-C   Brief Narrative:   65 y.o.female with past medical history of parkinson's disease started on amantadine August 2017, DM2, Bipolar disorder, hypothyroidism who was brought to [facility] with confusion, tremors for 3-4 days prior to this admission. Patient also had reports of nonproductive cough. On admission, patient was febrile with chest x-ray showing bilateral infiltrates and UA consistent with UTI.  Significant Events: 10/29 seen at [facility] > given [place] 10/30 admitted [facility] 11/02 PCCM called 11/05 extubated 11/06 transfer to [place]:    Fever of unknown origin - No fevers in past 72 hours - Initially, the thought was that patient has possible pneumonia versus urinary tract infection - Patient was broadly covered with Zithromax, Primaxin, meropenem. Meropenem stopped 08/26/2016. Currently patient is on vancomycin which was started 08/26/2016 - CT abd/pelvis and chest 11/9 - bilateral patch airspace disease suggest multi pulmonary infection, no intra-abdominal finding to account for fever  - Respiratory virus panel unremarkable - Blood cultures showed no growth to date - Urine culture with multiple species, none predominant - Continue vanco   Sepsis due to Bibasilar aspiration PNA +/- UTI  - Continue vanco for now - Pneumonia seen on CT scan   Acute hypoxic respiratory failure secondary to aspiration pneumonitis - Intubated 11/3 - failed swallow eval but repeat eval 11/10 - puree type food apparently tolerated well by pt  - Stable respiratory status  AKI - Secondary to prerenal etiology, sepsis - Creatinine improving, 1.52 --> 1.36 --> 1.33  Hypernatremia - Likely due to sepsis, volume depletion - Sodium now WNL  Hypokalemia / Hyperkalemia  -  Supplemented and then potassium 5.5  - Has gotten 1 dose lasix 10 mg IV 11/11 and potassium now WNL  Hypophosphatemia - Replaced  Diabetes mellitus, uncontrolled without complications without long-term insulin use - A1c 6.5 - Patient takes glipizide at home - Patient on Lantus 12 units at bedtime along with sliding scale insulin  Hypothyroidism - Continue levothyroxine 125 g daily  Drug induced Parkinson's disease - Continue amantadine   Bipolar disorder/Anxiety - Continue clonazepam, Wellbutrin, Abilify  Anemia of critical illness and chronic disease - Hemoglobin stable   Dysphagia / Severe protein calorie malnutrition  - Per SLP - dysphagia 1 diet as of 11/10   DVT prophylaxis: Lovenox subQ Code Status: full code  Family Communication: Spoke with her son 11/13 over the phone about plan of care  Disposition Plan: take out NG tube, try dysphagia 1 diet and if tolerates okay she possibly can be discharged to SNF in next 24-48 hours    Consultants:   PCCM  Palliative care   SLP  Procedures:   Intubation    Antimicrobials:  Rocephin 10/30 Zithromax 10/30 > 11/02 Primaxin 10/31 >11/02  Meropenem 11/03 >11/8 Vancomycin 11/08 -->   Subjective: No overnight events.   Objective: Vitals:   08/30/16 1807 08/30/16 2044 08/31/16 0500 08/31/16 0936  BP: 135/60 (!) 147/73 (!) 157/62 129/69  Pulse: 76 79 77 78  Resp: '18 18 17 16  ' Temp: 98.2 F (36.8 C) 98.4 F (36.9 C) 98.7 F (37.1 C) 98.1 F (36.7 C)  TempSrc: Oral Oral Oral Oral  SpO2: 98% 95% 96% 96%  Weight:  70.1 kg (154 lb 8.7 oz)    Height:  Intake/Output Summary (Last 24 hours) at 08/31/16 1123 Last data filed at 08/31/16 1000  Gross per 24 hour  Intake             5000 ml  Output                0 ml  Net             5000 ml   Filed Weights   08/28/16 0604 08/29/16 2209 08/30/16 2044  Weight: 66 kg (145 lb 8 oz) 68.1 kg (150 lb 1.6 oz) 70.1 kg (154 lb 8.7 oz)     Examination:  General exam: calm and comfortable  Respiratory system: no rhonchi, bilateral air entry  Cardiovascular system: S1 & S2 heard, RRR Gastrointestinal system: (+) BS, non distended  Central nervous system: no focal deficits  Extremities: no edema, palpable pulses bilaterally  Skin: warm, dry   Psychiatry: no agitation or restlessness   Data Reviewed: I have personally reviewed following labs and imaging studies  CBC:  Recent Labs Lab 08/26/16 1117 08/28/16 0537 08/29/16 0607 08/30/16 0526 08/31/16 0538  WBC 12.3* 12.9* 17.7* 16.9* 17.9*  HGB 10.3* 10.8* 10.1* 10.3* 10.9*  HCT 33.2* 35.1* 32.4* 32.9* 34.6*  MCV 84.9 84.6 84.6 84.4 84.2  PLT 280 245 302 289 400   Basic Metabolic Panel:  Recent Labs Lab 08/25/16 0604 08/26/16 1117 08/27/16 0511 08/28/16 0537 08/29/16 0607 08/30/16 0526 08/31/16 0538  NA 149* 154* 147* 144 144 142 142  K 3.5 3.8 4.2 4.0 5.5* 4.8 5.0  CL 116* 120* 119* 114* 114* 112* 110  CO2 25 23 21* 21* 21* 22 23  GLUCOSE 275* 135* 169* 180* 196* 189* 143*  BUN 42* 34* 33* 32* 28* 25* 24*  CREATININE 1.54* 1.71* 1.75* 1.52* 1.40* 1.36* 1.33*  CALCIUM 9.4 8.7* 8.6* 8.7* 8.6* 8.8* 8.8*  MG 1.9  --   --   --   --   --   --   PHOS <1.0* 2.4* 2.8  --   --   --   --    GFR: Estimated Creatinine Clearance: 39.6 mL/min (by C-G formula based on SCr of 1.33 mg/dL (H)). Liver Function Tests:  Recent Labs Lab 08/26/16 1117  AST 34  ALT 39  ALKPHOS 129*  BILITOT 0.5  PROT 6.1*  ALBUMIN 2.5*   No results for input(s): LIPASE, AMYLASE in the last 168 hours. No results for input(s): AMMONIA in the last 168 hours. Coagulation Profile: No results for input(s): INR, PROTIME in the last 168 hours. Cardiac Enzymes: No results for input(s): CKTOTAL, CKMB, CKMBINDEX, TROPONINI in the last 168 hours. BNP (last 3 results) No results for input(s): PROBNP in the last 8760 hours. HbA1C: No results for input(s): HGBA1C in the last 72  hours. CBG:  Recent Labs Lab 08/30/16 1545 08/30/16 2041 08/31/16 0015 08/31/16 0355 08/31/16 0759  GLUCAP 145* 196* 140* 133* 132*   Lipid Profile: No results for input(s): CHOL, HDL, LDLCALC, TRIG, CHOLHDL, LDLDIRECT in the last 72 hours. Thyroid Function Tests: No results for input(s): TSH, T4TOTAL, FREET4, T3FREE, THYROIDAB in the last 72 hours. Anemia Panel: No results for input(s): VITAMINB12, FOLATE, FERRITIN, TIBC, IRON, RETICCTPCT in the last 72 hours. Urine analysis:    Component Value Date/Time   COLORURINE YELLOW 08/26/2016 1226   APPEARANCEUR CLEAR 08/26/2016 1226   LABSPEC 1.012 08/26/2016 1226   PHURINE 8.0 08/26/2016 1226   GLUCOSEU NEGATIVE 08/26/2016 1226   HGBUR NEGATIVE 08/26/2016 1226  BILIRUBINUR NEGATIVE 08/26/2016 [place] 08/26/2016 1226   PROTEINUR NEGATIVE 08/26/2016 1226   NITRITE NEGATIVE 08/26/2016 1226   LEUKOCYTESUR NEGATIVE 08/26/2016 1226   Sepsis Labs: '@LABRCNTIP' (procalcitonin:4,lacticidven:4)   ) Recent Results (from the past 240 hour(s))  Culture, Urine     Status: None   Collection Time: 08/26/16 12:26 PM  Result Value Ref Range Status   Specimen Description URINE, RANDOM  Final   Special Requests NONE  Final   Culture NO GROWTH  Final   Report Status 08/27/2016 FINAL  Final      Radiology Studies: Dg Chest Port 1 View Result Date: 08/26/2016 Mild persistent peribronchial airspace opacities in the left lower thorax. Mild pulmonary vascular congestion. Electronically Signed   By: Fidela Salisbury M.D.   On: 08/26/2016 13:06     Scheduled Meds: . amantadine  100 mg Oral BID  . ARIPiprazole  10 mg Oral QPM  . [START ON 09/01/2016] buPROPion  100 mg Oral Daily  . chlorhexidine gluconate (MEDLINE KIT)  15 mL Mouth Rinse BID  . clonazePAM  0.25 mg Oral BID  . enoxaparin (LOVENOX) injection  40 mg Subcutaneous Q24H  . gabapentin  200 mg Oral BID  . insulin aspart  0-15 Units Subcutaneous Q4H  . insulin  glargine  12 Units Subcutaneous QHS  . [START ON 09/01/2016] levothyroxine  125 mcg Oral QAC breakfast  . mouth rinse  15 mL Mouth Rinse BID  . [START ON 09/01/2016] pantoprazole sodium  40 mg Oral Daily  . sodium chloride flush  10-40 mL Intracatheter Q12H  . vancomycin  750 mg Intravenous Q24H   Continuous Infusions: . sodium chloride 75 mL/hr at 08/31/16 0843     LOS: 14 days    Time spent: 15 minutes  Greater than 50% of the time spent on counseling and coordinating the care.   Leisa Lenz, MD [facility] Pager [phone]  If 7PM-7AM, please contact night-coverage [email or web address] Password [email or web address] 08/31/2016, 11:23 AM

## 2016-08-31 NOTE — Care Management Important Message (Signed)
Important Message  Patient Details  Name: Alexis HoleBetty S Merlos MRN: [id number] Date of Birth: 05/13/1951   Medicare Important Message Given:  Yes    Saiya Crist, Annamarie MajorCheryl U, RN 08/31/2016, 1:37 PM

## 2016-08-31 NOTE — Progress Notes (Signed)
NG tube removed per MD order. Patient tolerated well.

## 2016-08-31 NOTE — Progress Notes (Signed)
Speech Language Pathology Treatment: Dysphagia  Patient Details Name: [name] Rauen MRN: [id number] DOB: 07/19/1951 Today's Date: 08/31/2016 Time: [id number] SLP Time Calculation (min) (ACUTE ONLY): 12 min  Assessment / Plan / Recommendation Clinical Impression  F/u diet tolerance assessment complete. Patient with mild wet vocal quality at baseline suggestive of penetration or secretions, quickly clearing with cued throat clear. Po trials provided, utilizing straw to facilitate improved oral control of bolus. Intermittent subtle wet vocal quality  Noted with pos, again clearing with throat clear. Trials of dysphagia 2 solids provided as patient reporting dislike of pureed solids. Oral transit time remains delayed due to combination of missing dentition, deconditioning, and oral tremors, increasing risk of aspiration. Recommend continuation of current diet which patient appears to be tolerating. Additionally, recommend trial of RMT beginning 11/14 to facilitate improved respiratory and swallowing function.    HPI HPI: 65 year old female with a history of DM 2, hypothyroidism, CKD, bipolar disorder, and parkinsonism presentedwith 3-4 day history of increasing confusion and tremors. The patient was seen at [facility] on 08/16/2016. She was discharged from the emergency department with prescriptions for levofloxacin, promethazine, and albuterol. Unfortunately, the patient's symptoms persisted despite taking levofloxacin. As a result, the patient was brought to [facility]. Patient has had a nonproductive cough but no vomiting, diarrhea, dysuria. History was obtained from the patient's husband as the patient was encephalopathic. Workup in the emergency department revealed significant pyuria and chest x-ray showing bilateral consolidations, right greater than left.  Intubated 11/3-11/5.       SLP Plan  Continue with current plan of care     Recommendations  Diet  recommendations: Dysphagia 1 (puree);Nectar-thick liquid Liquids provided via: Straw Medication Administration: Crushed with puree Supervision: Staff to assist with self feeding;Full supervision/cueing for compensatory strategies Compensations: Slow rate;Small sips/bites Postural Changes and/or Swallow Maneuvers: Seated upright 90 degrees;Upright 30-60 min after meal                Oral Care Recommendations: Oral care BID Follow up Recommendations: Skilled Nursing facility Plan: Continue with current plan of care       GO             [facility] Sherilyn Windhorst MA, CCC-SLP [phone]    Ferdinand LangoMcCoy Alexsys Eskin Meryl 08/31/2016, 12:23 PM

## 2016-08-31 NOTE — Progress Notes (Signed)
MD placed Protonix suspension for pt. Given and pt started to vomit, Zofran IV given and feed turned off for now. Will continue to monitor.

## 2016-08-31 NOTE — Progress Notes (Addendum)
Pt still complaining of indigestion pains in her chest after GI cocktail was given. Vitals taken with no remarkable change. MD notified.

## 2016-09-01 LAB — GLUCOSE, CAPILLARY
GLUCOSE-CAPILLARY: 113 mg/dL — AB (ref 65–99)
GLUCOSE-CAPILLARY: 134 mg/dL — AB (ref 65–99)
GLUCOSE-CAPILLARY: 147 mg/dL — AB (ref 65–99)
GLUCOSE-CAPILLARY: 149 mg/dL — AB (ref 65–99)
Glucose-Capillary: 116 mg/dL — ABNORMAL HIGH (ref 65–99)
Glucose-Capillary: 95 mg/dL (ref 65–99)

## 2016-09-01 LAB — CBC
HEMATOCRIT: 34.6 % — AB (ref 36.0–46.0)
Hemoglobin: 10.9 g/dL — ABNORMAL LOW (ref 12.0–15.0)
MCH: 26.5 pg (ref 26.0–34.0)
MCHC: 31.5 g/dL (ref 30.0–36.0)
MCV: 84 fL (ref 78.0–100.0)
PLATELETS: 382 10*3/uL (ref 150–400)
RBC: 4.12 MIL/uL (ref 3.87–5.11)
RDW: 15.8 % — AB (ref 11.5–15.5)
WBC: 13.8 10*3/uL — AB (ref 4.0–10.5)

## 2016-09-01 MED ORDER — PRO-STAT SUGAR FREE PO LIQD
30.0000 mL | Freq: Three times a day (TID) | ORAL | Status: DC
  Administered 2016-09-01 – 2016-09-04 (×9): 30 mL via ORAL
  Filled 2016-09-01 (×8): qty 30

## 2016-09-01 MED ORDER — GLUCERNA SHAKE PO LIQD
237.0000 mL | Freq: Three times a day (TID) | ORAL | Status: DC
  Administered 2016-09-01 – 2016-09-04 (×8): 237 mL via ORAL

## 2016-09-01 NOTE — Clinical Social Work Placement (Signed)
   CLINICAL SOCIAL WORK PLACEMENT  NOTE  Date:  09/01/2016  Patient Details  Name: Alexis Sanders MRN: [id number] Date of Birth: 09/18/1951  Clinical Social Work is seeking post-discharge placement for this patient at the Skilled  Nursing Facility level of care (*CSW will initial, date and re-position this form in  chart as items are completed):  Yes   Patient/family provided with [facility] Clinical Social Work Department's list of facilities offering this level of care within the geographic area requested by the patient (or if unable, by the patient's family).  Yes   Patient/family informed of their freedom to choose among providers that offer the needed level of care, that participate in Medicare, Medicaid or managed care program needed by the patient, have an available bed and are willing to accept the patient.  Yes   Patient/family informed of [employer]'s ownership interest in [facility] and [facility], as well as of the fact that they are under no obligation to receive care at these facilities.  PASRR submitted to EDS on 09/01/16     PASRR number received on       Existing PASRR number confirmed on       FL2 transmitted to all facilities in geographic area requested by pt/family on       FL2 transmitted to all facilities within larger geographic area on 09/01/16     Patient informed that his/her managed care company has contracts with or will negotiate with certain facilities, including the following:        Yes   Patient/family informed of bed offers received.  Patient chooses bed at [facility] [place]     Physician recommends and patient chooses bed at      Patient to be transferred to [facility] [place] on   Patient to be transferred to facility by Ambulance Sharin Mons(PTAR)     Patient family notified on  of transfer.  Name of family member notified:  Charlynne PanderSteve Therrell     PHYSICIAN Please sign FL2     Additional Comment:     _______________________________________________ Renard HamperLecretia Catalia Massett, Student-Social Work 09/01/2016, 12:52 PM

## 2016-09-01 NOTE — NC FL2 (Signed)
[place] MEDICAID FL2 LEVEL OF CARE SCREENING TOOL     IDENTIFICATION  Patient Name: Alexis Sanders Birthdate: 03/30/1951 Sex: female Admission Date (Current Location): 08/17/2016  [facility] and [place] Number:  Herbalist and Address:  [facility] [place] [place] [address], [place], [place] [address]      Provider Number: [id number]  Attending Physician Name and Address:  Robbie Lis, MD  Relative Name and Phone Number:  Billi Bright (spouse) [phone]    Current Level of Care: Hospital Recommended Level of Care: [place] Prior Approval Number:    Date Approved/Denied:   PASRR Number: Applied for [facility] 09/01/2016 [place] MUST ID# [id number]  Discharge Plan: SNF    Current Diagnoses: Patient Active Problem List   Diagnosis Date Noted  . FUO (fever of unknown origin)   . Dysphagia   . Palliative care encounter   . Goals of care, counseling/discussion   . Aspiration pneumonia of right lower lobe due to vomit ([place])   . Acute respiratory failure with hypoxia ([place]) 08/20/2016  . SOB (shortness of breath)   . Acute encephalopathy 08/18/2016  . UTI (urinary tract infection) 08/18/2016  . ARF (acute renal failure) ([place]) 08/18/2016  . Hypothyroidism 08/18/2016  . Bipolar 1 disorder ([place]) 08/18/2016  . Parkinson's disease ([place]) 08/18/2016  . Diabetes mellitus type 2 in nonobese ([place]) 08/18/2016  . Acute renal failure superimposed on stage 3 chronic kidney disease ([place]) 08/18/2016  . Aspiration pneumonia ([place]) 08/18/2016  . Septic shock ([place]) 08/17/2016    Orientation RESPIRATION BLADDER Height & Weight     Self, Place   Normal Incontinent Weight: 145 lb 1 oz (65.8 kg) Height:  _0  (160 cm)  BEHAVIORAL SYMPTOMS/MOOD NEUROLOGICAL BOWEL NUTRITION STATUS      Continent  Dysphagia 1  AMBULATORY STATUS COMMUNICATION OF NEEDS Skin   Extensive Assist Verbally Other (Comment) (Ecchymosis right and left arms)                        Personal Care Assistance Level of Assistance  Bathing, Feeding, Dressing Bathing Assistance: Maximum assistance Feeding assistance: Maximum assistance Dressing Assistance: Maximum assistance     Functional Limitations Info  Sight, Hearing, Speech Sight Info: Adequate Hearing Info: Adequate Speech Info: Impaired (Delayed Responses )    SPECIAL CARE FACTORS FREQUENCY   PT (By licensed PT)   OT (By licensed OT)  Speech Therapy    Min 2X/week       Min 2X/week     Min 2x/week    2 weeks        Contractures  Not Present    Additional Factors Info   Code Status:  Allergies:    Full Code   Codeine, Metformin, Penicillins           Current Medications (09/01/2016):  This is the current hospital active medication list Current Facility-Administered Medications  Medication Dose Route Frequency Provider Last Rate Last Dose  . 0.45 % sodium chloride infusion   Intravenous Continuous Cherene Altes, MD 75 mL/hr at 09/01/16 0023    . acetaminophen (TYLENOL) solution 650 mg  650 mg Per Tube Q4H PRN Cherene Altes, MD   650 mg at 08/26/16 1804  . albuterol (PROVENTIL) (2.5 MG/3ML) 0.083% nebulizer solution 2.5 mg  2.5 mg Nebulization Q2H PRN Modena Jansky, MD   2.5 mg at 08/19/16 1622  . amantadine (SYMMETREL) capsule 100 mg  100 mg Oral  BID Robbie Lis, MD   100 mg at 08/31/16 2142  . ARIPiprazole (ABILIFY) tablet 10 mg  10 mg Oral QPM Robbie Lis, MD   10 mg at 08/31/16 1723  . bisacodyl (DULCOLAX) suppository 10 mg  10 mg Rectal Daily PRN Chesley Mires, MD      . buPROPion [facility]) tablet 100 mg  100 mg Oral Daily Robbie Lis, MD      . chlorhexidine gluconate (MEDLINE KIT) (PERIDEX) 0.12 % solution 15 mL  15 mL Mouth Rinse BID Chesley Mires, MD   15 mL at 08/31/16 2026  . clonazePAM (KLONOPIN) tablet 0.25 mg  0.25 mg Oral BID Robbie Lis, MD   0.25 mg at 08/31/16 2142  . docusate (COLACE) 50 MG/5ML liquid 100 mg  100 mg Per Tube BID PRN Chesley Mires, MD       . enoxaparin (LOVENOX) injection 40 mg  40 mg Subcutaneous Q24H Rolla Flatten, RPH   40 mg at 08/31/16 1723  . gabapentin (NEURONTIN) 250 MG/5ML solution 200 mg  200 mg Oral BID Robbie Lis, MD   200 mg at 08/31/16 2228  . insulin aspart (novoLOG) injection 0-9 Units  0-9 Units Subcutaneous Q4H Robbie Lis, MD   1 Units at 08/31/16 2024  . insulin glargine (LANTUS) injection 12 Units  12 Units Subcutaneous QHS Cherene Altes, MD   12 Units at 08/31/16 2142  . levothyroxine (SYNTHROID, LEVOTHROID) tablet 125 mcg  125 mcg Oral QAC breakfast Robbie Lis, MD      . LORazepam (ATIVAN) injection 0.5-1 mg  0.5-1 mg Intravenous Q6H PRN Allie Bossier, MD   0.5 mg at 08/26/16 1837  . MEDLINE mouth rinse  15 mL Mouth Rinse BID Rush Farmer, MD   15 mL at 08/31/16 2144  . ondansetron (ZOFRAN) injection 4 mg  4 mg Intravenous Q6H PRN Rise Patience, MD   4 mg at 08/31/16 0124  . pantoprazole sodium (PROTONIX) 40 mg/20 mL oral suspension 40 mg  40 mg Oral Daily Robbie Lis, MD      . RESOURCE THICKENUP CLEAR   Oral PRN Robbie Lis, MD      . sodium chloride flush (NS) 0.9 % injection 10-40 mL  10-40 mL Intracatheter Q12H Brand Males, MD   10 mL at 08/25/16 2300     Discharge Medications: Please see discharge summary for a list of discharge medications.  Relevant Imaging Results:  Relevant Lab Results:   Additional Information SSN:  [id number]    Lajoyce Lauber Work [phone]

## 2016-09-01 NOTE — Progress Notes (Signed)
Physical Therapy Treatment Patient Details Name: Alexis Sanders MRN: [id number] DOB: 06/03/1951 Today's Date: 09/01/2016    History of Present Illness Pt admitted with hypoxic respiratory failure from aspiration. Intubated 11/3-11/5. PMH: Parkinsons, DM, Bipolar.    PT Comments    Pt limited by fatigue from sitting up in chair for hours.  Emphasized sitting balance, scooting and standing tolerance within the transfer to bed.   Follow Up Recommendations  SNF     Equipment Recommendations       Recommendations for Other Services       Precautions / Restrictions Precautions Precautions: Fall    Mobility  Bed Mobility Overal bed mobility: Needs Assistance Bed Mobility: Sit to Supine       Sit to supine: Max assist;+2 for physical assistance   General bed mobility comments: pt went down on R UE and used abdominals to assist  Transfers Overall transfer level: Needs assistance Equipment used: 2 person hand held assist Transfers: Sit to/from [employer];Stand Pivot Transfers Sit to Stand: Total assist;+2 physical assistance Stand pivot transfers: Total assist;+2 physical assistance       General transfer comment: pt having trouble staying forward in the recline and had to be assisted to scoot to EOB, but once standing with support, she stood briefly with less support standing that the transition.  Ambulation/Gait             General Gait Details: pt too fatigued (being up in the chair) to attempt more than standingl   Stairs            Wheelchair Mobility    Modified Rankin (Stroke Patients Only)       Balance Overall balance assessment: Needs assistance   Sitting balance-Leahy Scale: Poor Sitting balance - Comments: max assist to stay forward in the chair while prepping for transfer.     Standing balance-Leahy Scale: Zero Standing balance comment: standing with face to face assist and blcking/guarding L LE                    Cognition  Arousal/Alertness: Awake/alert Behavior During Therapy: Flat affect Overall Cognitive Status: No family/caregiver present to determine baseline cognitive functioning Area of Impairment: Safety/judgement;Awareness;Problem solving;Attention   Current Attention Level: Selective Memory: Decreased short-term memory;Decreased recall of precautions Following Commands: Follows one step commands with increased time;Follows one step commands inconsistently Safety/Judgement: Decreased awareness of safety;Decreased awareness of deficits Awareness: Intellectual Problem Solving: Slow processing;Requires verbal cues;Requires tactile cues      Exercises      General Comments        Pertinent Vitals/Pain Pain Assessment: Faces Faces Pain Scale: Hurts little more Pain Location: R arm Pain Descriptors / Indicators: Aching;Sore Pain Intervention(s): Monitored during session;Repositioned    Home Living                      Prior Function            PT Goals (current goals can now be found in the care plan section) Acute Rehab PT Goals Patient Stated Goal: not stated PT Goal Formulation: With patient Time For Goal Achievement: 09/07/16 Potential to Achieve Goals: Fair Progress towards PT goals: Progressing toward goals    Frequency    Min 2X/week      PT Plan Current plan remains appropriate    Co-evaluation             End of Session   Activity Tolerance: Patient tolerated treatment well Patient  left: in bed;with call bell/phone within reach     Time: 1445-1504 PT Time Calculation (min) (ACUTE ONLY): 19 min  Charges:  $Therapeutic Activity: 8-22 mins                    G CodesEliseo Gum:      Joury Allcorn V Paiden Cavell 09/01/2016, 3:56 PM 09/01/2016  [place] BingKen Cathaleen Korol, PT [phone]  (pager)

## 2016-09-01 NOTE — Progress Notes (Signed)
Speech Language Pathology Treatment: Dysphagia  Patient Details Name: Alexis Sanders MRN: [id number] DOB: 08/16/1951 Today's Date: 09/01/2016 Time: [id number] SLP Time Calculation (min) (ACUTE ONLY): 36 min  Assessment / Plan / Recommendation Clinical Impression  Pt tolerating dysphagia 1, nectar-thick liquids with min cues for lip seal and limited-sized sips. Rapid, successive sips led to immediate cough, concerning for penetration.  Pt assessed for use of Respiratory Muscle Training system - needed max verbal cues for adequate seal due to tremors.  Values as follows: MIP reference-93; LLN-52; identified value-25; target pressure-19.  MEP reference-71; LLN-52; identified value-17; target pressure-17.  Pt became quite fatigued, and was not able to participated in instruction re: use of trainers/practice.  Will plan to return next date for education.   HPI HPI: 65 year old female with a history of DM 2, hypothyroidism, CKD, bipolar disorder, and parkinsonism presentedwith 3-4 day history of increasing confusion and tremors. The patient was seen at [facility] on 08/16/2016. She was discharged from the emergency department with prescriptions for levofloxacin, promethazine, and albuterol. Unfortunately, the patient's symptoms persisted despite taking levofloxacin. As a result, the patient was [facility]. Patient has had a nonproductive cough but no vomiting, diarrhea, dysuria. History was obtained from the patient's husband as the patient was encephalopathic. Workup in the emergency department revealed significant pyuria and chest x-ray showing bilateral consolidations, right greater than left.  Intubated 11/3-11/5.  MBS 11/13 - started dys 1, nectar-thick liquids.       SLP Plan  Continue with current plan of care     Recommendations  Diet recommendations: Dysphagia 1 (puree);Nectar-thick liquid Liquids provided via: Straw Medication Administration:  Crushed with puree Supervision: Staff to assist with self feeding;Full supervision/cueing for compensatory strategies Compensations: Slow rate;Small sips/bites Postural Changes and/or Swallow Maneuvers: Seated upright 90 degrees;Upright 30-60 min after meal                Oral Care Recommendations: Oral care BID Follow up Recommendations: Skilled Nursing facility Plan: Continue with current plan of care       GO                [name], Jeremiah Curci Laurice 09/01/2016, 4:09 PM

## 2016-09-01 NOTE — Clinical Social Work Note (Signed)
Clinical Social Work Assessment  Patient Details  Name: Alexis Sanders MRN: [id number] Date of Birth: 06/05/1951  Date of referral:  09/01/16               Reason for consult:  Facility Placement                Permission sought to share information with:  Family Supports Permission granted to share information::  No (Patient is not oriented)  Name::     Alexis Sanders  Agency::     Relationship::  Spouse  Contact Information:  [phone]  Housing/Transportation Living arrangements for the past 2 months:  Single Family Home Source of Information:  Spouse, Adult Children Patient Interpreter Needed:  None Criminal Activity/Legal Involvement Pertinent to Current Situation/Hospitalization:  No - Comment as needed Significant Relationships:  Spouse, Adult Children Lives with:  Relatives Do you feel safe going back to the place where you live?  No Need for family participation in patient care:  Yes (Comment)  Care giving concerns:  No care giving concerns addressed at this time.    Social Worker assessment / plan:  CSW Intern spoke with patient's husband, Alexis PanderSteve Sanders about discharge plans.  Alexis Sanders and the patient are legally separated however he is involved with decision making. CSW Intern explained to Alexis Sanders that short term rehab was recommended for [name] and he was agreeable to it.  Alexis Sanders expressed that he did not want his wife to return to [facility].  Husband reported that his wife resides with his brother, Alexis Sanders and his family in [place] and he would like to keep her close by if possible.  Per husband, Alexis Sanders has two adult children that lives outside of the home.  Alexis Sanders, patient's son lives in [place] and Alexis Sanders, patient's daughter lives in [place].  Alexis Sanders reported that Alexis [place] currently in the hospital [place], and has been in and out of skilled facilities.  Alexis Sanders expressed interest in [facility] and CSW Intern explained the facility search process and and emailed the SNF list to him.  Alexis Sanders stated that his stepson Alexis Sanders (patient's son) needed to be informed and asked CSW Intern to call him.  CSW Intern spoke with Alexis FearingJames by phone.  Alexis FearingJames stated that the his stepfather and mother are separated and he [place] his [place] in [place].  CSW Intern explained the facility search process to Alexis FearingJames and emailed him a list of skilled facilities.  Alexis FearingJames indicated his preferences as [facility].  Alexis FearingJames currently lives in [place], works in [place] and did not want his mother in a facility in [place] as it is too far away from him.  Alexis FearingJames thanked CSW Intern for time, and was appreciative of the social work services that was provided to his mother.    Employment status:  Retired Health and safety inspectornsurance information:  Medicare PT Recommendations:  Skilled Nursing Facility Information / Referral to community resources:  Skilled Nursing Facility  Patient/Family's Response to care:  Family did not express any concerns regarding patient's care during hospital stay.  Patient/Family's Understanding of and Emotional Response to Diagnosis, Current Treatment, and Prognosis:  Not discussed  Emotional Assessment Appearance:  Appears older than stated age Attitude/Demeanor/Rapport:  Unable to Assess Affect (typically observed):  Unable to Assess Orientation:  Oriented to Self, Oriented to Place Alcohol / Substance use:  Not Applicable Psych involvement (Current and /or in  the community):  No (Comment)  Discharge Needs  Concerns to be addressed:  Discharge Planning Concerns Readmission within the last 30 days:  No Current discharge risk:  None Barriers to Discharge:  No Barriers Identified   Alexis Sanders, Student-Social Work 09/01/2016, 12:14 PM

## 2016-09-01 NOTE — Progress Notes (Signed)
Patient ID: Alexis Sanders, female   DOB: Jul 28, 1951, 65 y.o.   MRN: [id number]  PROGRESS NOTE    Alexis Sanders  BLT:[id number] DOB: 10/06/1951 DOA: 08/17/2016  PCP: Vicie Mutters, PA-C   Brief Narrative:   65 y.o.female with past medical history of parkinson's disease started on amantadine August 2017, DM2, Bipolar disorder, hypothyroidism who was brought to [facility] with confusion, tremors for 3-4 days prior to this admission. Patient also had reports of nonproductive cough. On admission, patient was febrile with chest x-ray showing bilateral infiltrates and UA consistent with UTI.  Significant Events: 10/29 seen at [facility] > given [place] 10/30 admitted [facility] 11/02 PCCM called 11/05 extubated 11/06 transferred to tele  Assessment & Plan:    Fever of unknown origin - No fevers in past 96 hours - Initially, the thought was that patient has possible pneumonia versus urinary tract infection - Patient was broadly covered with Zithromax, Primaxin, meropenem. Meropenem stopped 08/26/2016. Currently patient is on vancomycin which was started 08/26/2016 - Stop vanco today  - CT abd/pelvis and chest 11/9 - bilateral patch airspace disease suggest multi pulmonary infection, no intra-abdominal finding to account for fever  - Respiratory virus panel unremarkable - Blood cultures showed no growth to date - Urine culture with multiple species, none predominant  Sepsis due to Bibasilar aspiration PNA +/- UTI  - Continue vanco for now - Pneumonia seen on CT scan   Acute hypoxic respiratory failure secondary to aspiration pneumonitis - Intubated 11/3 - failed swallow eval but repeat eval 11/10 - puree type food apparently tolerated well by pt  - Stable respiratory status  AKI - Secondary to prerenal etiology, sepsis - Creatinine improving, 1.52 --> 1.36 --> 1.33  Hypernatremia - Likely due to sepsis, volume depletion - Sodium now WNL  Hypokalemia / Hyperkalemia  -  Supplemented and then potassium 5.5  - Has gotten 1 dose lasix 10 mg IV 11/11 and potassium now WNL  Hypophosphatemia - Replaced  Diabetes mellitus, uncontrolled without complications without long-term insulin use - A1c 6.5 - Patient takes glipizide at home - Patient on Lantus 12 units at bedtime along with sliding scale insulin  Hypothyroidism - Continue levothyroxine 125 g daily  Drug induced Parkinson's disease - Continue amantadine   Bipolar disorder/Anxiety - Continue clonazepam, Wellbutrin, Abilify  Anemia of critical illness and chronic disease - Hemoglobin stable   Dysphagia / Severe protein calorie malnutrition  - Per SLP - dysphagia 1 diet as of 11/10 - Monitor po intake, If eats 3 meals a day no need for PEG and her son agreed with that. If she eats less than that then she may require peg for nutritional support    DVT prophylaxis: Lovenox subQ Code Status: full code  Family Communication: Spoke with her son 11/13 over the phone about plan of care  Disposition Plan: SNF in next 24 hours if PO intake okay    Consultants:   PCCM  Palliative care   SLP  Procedures:   Intubation    Antimicrobials:  Rocephin 10/30 Zithromax 10/30 > 11/02 Primaxin 10/31 >11/02  Meropenem 11/03 >11/8 Vancomycin 11/08 --> 09/01/2016   Subjective: No overnight events.   Objective: Vitals:   08/31/16 1706 08/31/16 2020 09/01/16 0414 09/01/16 0900  BP: (!) 145/75 135/70 136/64 (!) 170/73  Pulse: 75 77 78 72  Resp: '18 16 19 18  ' Temp: 98.6 F (37 C) 98 F (36.7 C) 98.1 F (36.7 C) 98.3 F (36.8 C)  TempSrc: Oral  Oral  SpO2: 99% 99% 100% 94%  Weight:  65.8 kg (145 lb 1 oz)    Height:        Intake/Output Summary (Last 24 hours) at 09/01/16 1027 Last data filed at 09/01/16 0900  Gross per 24 hour  Intake             1410 ml  Output                0 ml  Net             1410 ml   Filed Weights   08/29/16 2209 08/30/16 2044 08/31/16 2020  Weight:  68.1 kg (150 lb 1.6 oz) 70.1 kg (154 lb 8.7 oz) 65.8 kg (145 lb 1 oz)    Examination:  General exam: calm and comfortable, no distress  Respiratory system: no rhonchi, bilateral air entry  Cardiovascular system: S1 & S2 heard, rate controlled   Gastrointestinal system: (+) BS, non distended  Central nervous system: nonfocal, disoriented  Extremities: no edema, palpable pulses Skin: warm, dry, no lesions or ulcers    Psychiatry: no agitation or restlessness, normal mood   Data Reviewed: I have personally reviewed following labs and imaging studies  CBC:  Recent Labs Lab 08/28/16 0537 08/29/16 0607 08/30/16 0526 08/31/16 0538 09/01/16 0513  WBC 12.9* 17.7* 16.9* 17.9* 13.8*  HGB 10.8* 10.1* 10.3* 10.9* 10.9*  HCT 35.1* 32.4* 32.9* 34.6* 34.6*  MCV 84.6 84.6 84.4 84.2 84.0  PLT 245 302 289 347 009   Basic Metabolic Panel:  Recent Labs Lab 08/26/16 1117 08/27/16 0511 08/28/16 0537 08/29/16 0607 08/30/16 0526 08/31/16 0538  NA 154* 147* 144 144 142 142  K 3.8 4.2 4.0 5.5* 4.8 5.0  CL 120* 119* 114* 114* 112* 110  CO2 23 21* 21* 21* 22 23  GLUCOSE 135* 169* 180* 196* 189* 143*  BUN 34* 33* 32* 28* 25* 24*  CREATININE 1.71* 1.75* 1.52* 1.40* 1.36* 1.33*  CALCIUM 8.7* 8.6* 8.7* 8.6* 8.8* 8.8*  PHOS 2.4* 2.8  --   --   --   --    GFR: Estimated Creatinine Clearance: 38.5 mL/min (by C-G formula based on SCr of 1.33 mg/dL (H)). Liver Function Tests:  Recent Labs Lab 08/26/16 1117  AST 34  ALT 39  ALKPHOS 129*  BILITOT 0.5  PROT 6.1*  ALBUMIN 2.5*   No results for input(s): LIPASE, AMYLASE in the last 168 hours. No results for input(s): AMMONIA in the last 168 hours. Coagulation Profile: No results for input(s): INR, PROTIME in the last 168 hours. Cardiac Enzymes: No results for input(s): CKTOTAL, CKMB, CKMBINDEX, TROPONINI in the last 168 hours. BNP (last 3 results) No results for input(s): PROBNP in the last 8760 hours. HbA1C: No results for input(s):  HGBA1C in the last 72 hours. CBG:  Recent Labs Lab 08/31/16 1641 08/31/16 2011 09/01/16 0022 09/01/16 0413 09/01/16 0801  GLUCAP 135* 137* 95 116* 113*   Lipid Profile: No results for input(s): CHOL, HDL, LDLCALC, TRIG, CHOLHDL, LDLDIRECT in the last 72 hours. Thyroid Function Tests: No results for input(s): TSH, T4TOTAL, FREET4, T3FREE, THYROIDAB in the last 72 hours. Anemia Panel: No results for input(s): VITAMINB12, FOLATE, FERRITIN, TIBC, IRON, RETICCTPCT in the last 72 hours. Urine analysis:    Component Value Date/Time   COLORURINE YELLOW 08/26/2016 1226   APPEARANCEUR CLEAR 08/26/2016 1226   LABSPEC 1.012 08/26/2016 1226   PHURINE 8.0 08/26/2016 1226   GLUCOSEU NEGATIVE 08/26/2016 1226   HGBUR NEGATIVE  08/26/2016 [place] 08/26/2016 [place] 08/26/2016 1226   PROTEINUR NEGATIVE 08/26/2016 1226   NITRITE NEGATIVE 08/26/2016 1226   LEUKOCYTESUR NEGATIVE 08/26/2016 1226   Sepsis Labs: '@LABRCNTIP' (procalcitonin:4,lacticidven:4)   ) Recent Results (from the past 240 hour(s))  Culture, Urine     Status: None   Collection Time: 08/26/16 12:26 PM  Result Value Ref Range Status   Specimen Description URINE, RANDOM  Final   Special Requests NONE  Final   Culture NO GROWTH  Final   Report Status 08/27/2016 FINAL  Final      Radiology Studies: Dg Chest Port 1 View Result Date: 08/26/2016 Mild persistent peribronchial airspace opacities in the left lower thorax. Mild pulmonary vascular congestion. Electronically Signed   By: Fidela Salisbury M.D.   On: 08/26/2016 13:06     Scheduled Meds: . amantadine  100 mg Oral BID  . ARIPiprazole  10 mg Oral QPM  . buPROPion  100 mg Oral Daily  . chlorhexidine gluconate (MEDLINE KIT)  15 mL Mouth Rinse BID  . clonazePAM  0.25 mg Oral BID  . enoxaparin (LOVENOX) injection  40 mg Subcutaneous Q24H  . gabapentin  200 mg Oral BID  . insulin aspart  0-9 Units Subcutaneous Q4H  . insulin  glargine  12 Units Subcutaneous QHS  . levothyroxine  125 mcg Oral QAC breakfast  . mouth rinse  15 mL Mouth Rinse BID  . pantoprazole sodium  40 mg Oral Daily  . sodium chloride flush  10-40 mL Intracatheter Q12H  . vancomycin  750 mg Intravenous Q24H   Continuous Infusions: . sodium chloride 75 mL/hr at 09/01/16 0023     LOS: 15 days    Time spent: 15 minutes  Greater than 50% of the time spent on counseling and coordinating the care.   Leisa Lenz, MD [facility] Pager [phone]  If 7PM-7AM, please contact night-coverage [email or web address] Password [email or web address] 09/01/2016, 10:27 AM

## 2016-09-01 NOTE — Progress Notes (Addendum)
Nutrition Follow-up  DOCUMENTATION CODES:   Not applicable  INTERVENTION:  Provide Glucerna Shake po TID (thickened to nectar thick liquids), each supplement provides 220 kcal and 10 grams of protein.  Provide 30 ml Prostat po TID, each supplement provides 100 kcal and 15 grams of protein.   Encourage adequate PO intake.   NUTRITION DIAGNOSIS:   Inadequate oral intake related to inability to eat as evidenced by NPO status; diet advanced; po 20%; ongoing  GOAL:   Patient will meet greater than or equal to 90% of their needs; not met  MONITOR:   PO intake, Supplement acceptance, Labs, Weight trends, Skin, I & O's  REASON FOR ASSESSMENT:   Consult Enteral/tube feeding initiation and management  ASSESSMENT:   65 y.o. female with history of Parkinson's disease started on amantadine 3 months ago, diabetes mellitus type 2, bipolar disorder, hypothyroidism brought to the ER after patient was having increasing confusion and tremors.  NGT removed yesterday. Tube feeding discontinued. Pt is currently on a dysphagia 1 diet with nectar thick liquids. Meal completion has been 20%. Pt reports food taste has been good at meals. Per MD note, if pt has poor po intake at meals and eats less than 3 meals a day, PEG will be required for nutritional support. Will monitor po intake. RD to order Glucerna Shake and Prostat to aid in caloric and protein needs. Pt encouraged to eat her food at meals.   Labs and medications reviewed.   Diet Order:  DIET - DYS 1 Room service appropriate? Yes; Fluid consistency: Nectar Thick  Skin:  Reviewed, no issues  Last BM:  11/12  Height:   Ht Readings from Last 1 Encounters:  08/21/16 _0  (1.6 m)    Weight:   Wt Readings from Last 1 Encounters:  08/31/16 145 lb 1 oz (65.8 kg)    Ideal Body Weight:  52.3 kg  BMI:  Body mass index is 25.7 kg/m.  Estimated Nutritional Needs:   Kcal:  1700-1900  Protein:  80-90 grams  Fluid:  1.7 - 1.9  L/day  EDUCATION NEEDS:   No education needs identified at this time  Corrin Parker, MS, RD, LDN Pager # [phone] After hours/ weekend pager # [phone]

## 2016-09-02 DIAGNOSIS — E119 Type 2 diabetes mellitus without complications: Secondary | ICD-10-CM

## 2016-09-02 LAB — CBC
HCT: 38.1 % (ref 36.0–46.0)
HEMOGLOBIN: 11.6 g/dL — AB (ref 12.0–15.0)
MCH: 26.1 pg (ref 26.0–34.0)
MCHC: 30.4 g/dL (ref 30.0–36.0)
MCV: 85.8 fL (ref 78.0–100.0)
PLATELETS: 310 10*3/uL (ref 150–400)
RBC: 4.44 MIL/uL (ref 3.87–5.11)
RDW: 16.3 % — ABNORMAL HIGH (ref 11.5–15.5)
WBC: 9.5 10*3/uL (ref 4.0–10.5)

## 2016-09-02 LAB — BASIC METABOLIC PANEL
Anion gap: 8 (ref 5–15)
BUN: 23 mg/dL — AB (ref 6–20)
CHLORIDE: 113 mmol/L — AB (ref 101–111)
CO2: 23 mmol/L (ref 22–32)
CREATININE: 1.44 mg/dL — AB (ref 0.44–1.00)
Calcium: 9.1 mg/dL (ref 8.9–10.3)
GFR, EST AFRICAN AMERICAN: 43 mL/min — AB (ref >60)
GFR, EST NON AFRICAN AMERICAN: 37 mL/min — AB (ref >60)
Glucose, Bld: 127 mg/dL — ABNORMAL HIGH (ref 65–99)
POTASSIUM: 4.9 mmol/L (ref 3.5–5.1)
SODIUM: 144 mmol/L (ref 135–145)

## 2016-09-02 LAB — GLUCOSE, CAPILLARY
GLUCOSE-CAPILLARY: 114 mg/dL — AB (ref 65–99)
GLUCOSE-CAPILLARY: 152 mg/dL — AB (ref 65–99)
GLUCOSE-CAPILLARY: 124 mg/dL — AB (ref 65–99)
GLUCOSE-CAPILLARY: 74 mg/dL (ref 65–99)
Glucose-Capillary: 175 mg/dL — ABNORMAL HIGH (ref 65–99)
Glucose-Capillary: 154 mg/dL — ABNORMAL HIGH (ref 65–99)

## 2016-09-02 NOTE — Progress Notes (Signed)
Speech Language Pathology Treatment: Dysphagia  Patient Details Name: Alexis Sanders MRN: [id number] DOB: 07/08/1951 Today's Date: 09/02/2016 Time: 1530-1550 SLP Time Calculation (min) (ACUTE ONLY): 20 min  Assessment / Plan / Recommendation Clinical Impression  Session focused on use of respiratory muscle trainers and educating pt in their application.  She required mod verbal/visual/tactile cues for inspiratory and expiratory training, having difficulty forming seal around mouth piece due to tremors, and needing frequent rest breaks between trials.  Pt described effort at 10/10, but did not want to reduce resistance on devices.  Materials were labeled and placed with belongings in the event pt is D/Cd to SNF prior to our services working with her again.   Will follow pending discharge.    HPI HPI: 65 year old female with a history of DM 2, hypothyroidism, CKD, bipolar disorder, and parkinsonism presentedwith 3-4 day history of increasing confusion and tremors. The patient [facility]. She was discharged from the emergency department with prescriptions for levofloxacin, promethazine, and albuterol. Unfortunately, the patient's symptoms persisted despite taking levofloxacin. As a result, the patient was [facility]. Patient has had a nonproductive cough but no vomiting, diarrhea, dysuria. History was obtained from the patient's husband as the patient was encephalopathic. Workup in the emergency department revealed significant pyuria and chest x-ray showing bilateral consolidations, right greater than left.  Intubated 11/3-11/5.  MBS 11/10 - started dys 1, nectar-thick liquids.       SLP Plan  Continue with current plan of care     Recommendations  Diet recommendations: Dysphagia 1 (puree);Nectar-thick liquid Liquids provided via: Straw Medication Administration: Crushed with puree Supervision: Staff to assist with self  feeding;Full supervision/cueing for compensatory strategies Compensations: Slow rate;Small sips/bites Postural Changes and/or Swallow Maneuvers: Seated upright 90 degrees;Upright 30-60 min after meal                Oral Care Recommendations: Oral care BID Follow up Recommendations: Skilled Nursing facility Plan: Continue with current plan of care       GO                Alexis Sanders 09/02/2016, 3:53 PM Alexis Sanders, [place] CCC/SLP Pager [phone]

## 2016-09-02 NOTE — Progress Notes (Signed)
PROGRESS NOTE    Alexis Sanders  RSW:[id number] DOB: [birth year] DOA: 08/17/2016 PCP: Vicie Mutters, PA-C    Brief Narrative:  65 y.o.female with past medical history of parkinson's disease started on amantadine August 2017, DM2, Bipolar disorder, hypothyroidism who was brought to [facility] with confusion, tremors for 3-4 days prior to this admission. Patient also had reports of nonproductive cough. On admission, patient was febrile with chest x-ray showing bilateral infiltrates and UA consistent with UTI.  Patient monitored for PO intake and nutrition consulted for assessment of caloric needs and recommendations.   Assessment & Plan:   Principal Problem:   Septic shock ([place]) Active Problems:   Acute encephalopathy   UTI (urinary tract infection)   ARF (acute renal failure) (HCC)   Hypothyroidism   Bipolar 1 disorder (HCC)   Parkinson's disease ([place])   Diabetes mellitus type 2 in nonobese ([place])   Acute renal failure superimposed on stage 3 chronic kidney disease (HCC)   Aspiration pneumonia (HCC)   SOB (shortness of breath)   Acute respiratory failure with hypoxia (HCC)   Aspiration pneumonia of right lower lobe due to vomit (HCC)   FUO (fever of unknown origin)   Dysphagia   Palliative care encounter   Goals of care, counseling/discussion   Fever of unknown origin - No fevers in past 96 hours - Initially, the thought was that patient has possible pneumonia versus urinary tract infection - Patient was broadly covered with Zithromax, Primaxin, meropenem. Meropenem stopped 08/26/2016. Currently patient is on vancomycin which was started 08/26/2016 - Vanco stopped yesterday - CT abd/pelvis and chest 11/9 - bilateral patch airspace disease suggest multi pulmonary infection, no intra-abdominal finding to account for fever  - Respiratory virus panel unremarkable - Blood cultures showed no growth to date - Urine culture with multiple species, none predominant -  continue to monitor closely with cessation of antibiotics  Sepsis due to Bibasilar aspiration PNA +/- UTI  - Vanco discontinued yesterday - Pneumonia seen on CT scan  - continue to monitor for signs of aspiration   Acute hypoxic respiratory failure secondary to aspiration pneumonitis - Intubated 11/3 - failed swallow eval but repeat eval 11/10 - puree type food apparently tolerated well by pt  - Stable respiratory status - at this time no signs of aspiration  AKI - Secondary to prerenal etiology, sepsis - Creatinine improving, 1.52 --> 1.36 --> 1.33--> 1.44  Hypernatremia - Likely due to sepsis, volume depletion - Sodium now WNL - repeat BMP in am  Hypokalemia / Hyperkalemia  - Supplemented and then potassium 5.5  - Has gotten 1 dose lasix 10 mg IV 11/11 and potassium now WNL - repeat BMP in am  Hypophosphatemia - Replaced  Diabetes mellitus, uncontrolled without complications without long-term insulin use - A1c 6.5 - Patient takes glipizide at home - Patient on Lantus 12 units at bedtime along with sliding scale insulin - continue ACHS CBG checks  Hypothyroidism - Continue levothyroxine 125 g daily  Drug induced Parkinson's disease - Continue amantadine   Bipolar disorder/Anxiety - Continue clonazepam, Wellbutrin, Abilify  Anemia of critical illness and chronic disease - Hemoglobin stable   Dysphagia / Severe protein calorie malnutrition  - Per SLP - dysphagia 1 diet as of 11/10 - continue to encourage PO intake and food - 25% of diet eaten but good intake of Glucerna and nutritional shakes   DVT prophylaxis: Lovenox subQ Code Status: full code  Family Communication:  called patient's husband who was unavailable  but discussed plan of care with patient's son Disposition Plan:  awaiting PASRR for placement   Consultants:   PCCM  Palliative Care  SLP  Procedures:   Intubation  Antimicrobials:  Rocephin 10/30 Zithromax 10/30 >  11/02 Primaxin 10/31>11/02 Meropenem 11/03 >11/8 Vancomycin 11/08 --> 09/01/2016    Subjective: Patient seen and evaluated.  She is awake and alert to person and place.  She is not oriented to time or situation.  She voice she is trying to eat as much as she can to ensure that she does not need a feeding tube.  Patient son telephoned- states that patient and her husband are legally separated so he would like to be notified of patient's updates.  He is in agreement for constant care.  Patient is eating about 25% of meals but is taking in a significant amount of nutritive shakes like Glucerna.    Objective: Vitals:   09/01/16 2051 09/02/16 0421 09/02/16 0500 09/02/16 0930  BP: 139/62 119/69  (!) 147/60  Pulse: 81 71  87  Resp: '17 18  20  ' Temp: 98.3 F (36.8 C) 98.3 F (36.8 C)  98 F (36.7 C)  TempSrc:    Oral  SpO2: 93% 99%  98%  Weight:   65.7 kg (144 lb 13.5 oz)   Height:        Intake/Output Summary (Last 24 hours) at 09/02/16 1451 Last data filed at 09/02/16 1413  Gross per 24 hour  Intake             1430 ml  Output                0 ml  Net             1430 ml   Filed Weights   08/30/16 2044 08/31/16 2020 09/02/16 0500  Weight: 70.1 kg (154 lb 8.7 oz) 65.8 kg (145 lb 1 oz) 65.7 kg (144 lb 13.5 oz)    Examination:  General exam: Appears calm and comfortable  Respiratory system: Clear to auscultation. Respiratory effort normal. Cardiovascular system: S1 & S2 heard, RRR. No JVD, murmurs, rubs, gallops or clicks. No pedal edema. Gastrointestinal system: Abdomen is nondistended, soft and nontender. No organomegaly or masses felt. Normal bowel sounds heard. Central nervous system: Alert and oriented to person and place. No focal neurological deficits. Extremities: Symmetric 5 x 5 power. Skin: No rashes, lesions or ulcers Psychiatry:  Mood & affect appropriate.     Data Reviewed: I have personally reviewed following labs and imaging studies  CBC:  Recent  Labs Lab 08/29/16 0607 08/30/16 0526 08/31/16 0538 09/01/16 0513 09/02/16 0535  WBC 17.7* 16.9* 17.9* 13.8* 9.5  HGB 10.1* 10.3* 10.9* 10.9* 11.6*  HCT 32.4* 32.9* 34.6* 34.6* 38.1  MCV 84.6 84.4 84.2 84.0 85.8  PLT 302 289 347 382 629   Basic Metabolic Panel:  Recent Labs Lab 08/27/16 0511 08/28/16 0537 08/29/16 0607 08/30/16 0526 08/31/16 0538 09/02/16 0535  NA 147* 144 144 142 142 144  K 4.2 4.0 5.5* 4.8 5.0 4.9  CL 119* 114* 114* 112* 110 113*  CO2 21* 21* 21* '22 23 23  ' GLUCOSE 169* 180* 196* 189* 143* 127*  BUN 33* 32* 28* 25* 24* 23*  CREATININE 1.75* 1.52* 1.40* 1.36* 1.33* 1.44*  CALCIUM 8.6* 8.7* 8.6* 8.8* 8.8* 9.1  PHOS 2.8  --   --   --   --   --    GFR: Estimated Creatinine Clearance: 35.5 mL/min (by C-G  formula based on SCr of 1.44 mg/dL (H)). Liver Function Tests: No results for input(s): AST, ALT, ALKPHOS, BILITOT, PROT, ALBUMIN in the last 168 hours. No results for input(s): LIPASE, AMYLASE in the last 168 hours. No results for input(s): AMMONIA in the last 168 hours. Coagulation Profile: No results for input(s): INR, PROTIME in the last 168 hours. Cardiac Enzymes: No results for input(s): CKTOTAL, CKMB, CKMBINDEX, TROPONINI in the last 168 hours. BNP (last 3 results) No results for input(s): PROBNP in the last 8760 hours. HbA1C: No results for input(s): HGBA1C in the last 72 hours. CBG:  Recent Labs Lab 09/01/16 2008 09/01/16 2354 09/02/16 0417 09/02/16 0802 09/02/16 1215  GLUCAP 149* 134* 114* 152* 124*   Lipid Profile: No results for input(s): CHOL, HDL, LDLCALC, TRIG, CHOLHDL, LDLDIRECT in the last 72 hours. Thyroid Function Tests: No results for input(s): TSH, T4TOTAL, FREET4, T3FREE, THYROIDAB in the last 72 hours. Anemia Panel: No results for input(s): VITAMINB12, FOLATE, FERRITIN, TIBC, IRON, RETICCTPCT in the last 72 hours. Sepsis Labs: No results for input(s): PROCALCITON, LATICACIDVEN in the last 168 hours.  Recent Results  (from the past 240 hour(s))  Culture, Urine     Status: None   Collection Time: 08/26/16 12:26 PM  Result Value Ref Range Status   Specimen Description URINE, RANDOM  Final   Special Requests NONE  Final   Culture NO GROWTH  Final   Report Status 08/27/2016 FINAL  Final         Radiology Studies: No results found.      Scheduled Meds: . amantadine  100 mg Oral BID  . ARIPiprazole  10 mg Oral QPM  . buPROPion  100 mg Oral Daily  . chlorhexidine gluconate (MEDLINE KIT)  15 mL Mouth Rinse BID  . clonazePAM  0.25 mg Oral BID  . enoxaparin (LOVENOX) injection  40 mg Subcutaneous Q24H  . feeding supplement (GLUCERNA SHAKE)  237 mL Oral TID BM  . feeding supplement (PRO-STAT SUGAR FREE 64)  30 mL Oral TID  . gabapentin  200 mg Oral BID  . insulin aspart  0-9 Units Subcutaneous Q4H  . insulin glargine  12 Units Subcutaneous QHS  . levothyroxine  125 mcg Oral QAC breakfast  . mouth rinse  15 mL Mouth Rinse BID  . pantoprazole sodium  40 mg Oral Daily  . sodium chloride flush  10-40 mL Intracatheter Q12H   Continuous Infusions: . sodium chloride 75 mL/hr at 09/02/16 1118     LOS: 16 days    Time spent: 35 minutes    Loretha Stapler, MD [facility] Pager [phone]  If 7PM-7AM, please contact night-coverage [email or web address] Password [email or web address] 09/02/2016, 2:51 PM

## 2016-09-02 NOTE — Clinical Social Work Note (Signed)
Patient will need SNF and is ready for discharge. Patient is Level II PASRR and letter in [place] MUST indicates that patient will be evaluated before a PASRR number is given.  Facility selected - [facility], and admissions director Healther Oden contacted and updated.  Alexis Sanders, MSW, LCSW Licensed Clinical Social Worker Clinical Social Work Department [facility] [phone]

## 2016-09-02 NOTE — Plan of Care (Signed)
Problem: Education: Goal: Knowledge of [facility] Education information/materials will improve Outcome: Progressing POC reviewed with pt.; explained to pt. why HOB has to stay elevated 20-30 min. after eating/drinking.

## 2016-09-03 LAB — GLUCOSE, CAPILLARY
GLUCOSE-CAPILLARY: 101 mg/dL — AB (ref 65–99)
GLUCOSE-CAPILLARY: 68 mg/dL (ref 65–99)
GLUCOSE-CAPILLARY: 129 mg/dL — AB (ref 65–99)
Glucose-Capillary: 106 mg/dL — ABNORMAL HIGH (ref 65–99)
Glucose-Capillary: 109 mg/dL — ABNORMAL HIGH (ref 65–99)
Glucose-Capillary: 125 mg/dL — ABNORMAL HIGH (ref 65–99)
Glucose-Capillary: 189 mg/dL — ABNORMAL HIGH (ref 65–99)
Glucose-Capillary: 62 mg/dL — ABNORMAL LOW (ref 65–99)

## 2016-09-03 LAB — CBC
HEMATOCRIT: 34.7 % — AB (ref 36.0–46.0)
Hemoglobin: 10.8 g/dL — ABNORMAL LOW (ref 12.0–15.0)
MCH: 26.2 pg (ref 26.0–34.0)
MCHC: 31.1 g/dL (ref 30.0–36.0)
MCV: 84.2 fL (ref 78.0–100.0)
Platelets: 312 10*3/uL (ref 150–400)
RBC: 4.12 MIL/uL (ref 3.87–5.11)
RDW: 16 % — AB (ref 11.5–15.5)
WBC: 7.9 10*3/uL (ref 4.0–10.5)

## 2016-09-03 NOTE — Care Management Important Message (Signed)
Important Message  Patient Details  Name: [name] Lahaie MRN: [id number] Date of Birth: 05/05/1951   Medicare Important Message Given:  Yes    Ruqaya Strauss, Annamarie MajorCheryl U, RN 09/03/2016, 11:04 AM

## 2016-09-03 NOTE — Clinical Social Work Note (Signed)
PASRR evaluator came to hospital today to see patient and clinicals requested. Junie PanningHeather Oden, [facility] contacted and updated.  Patient will discharge to [facility] once PASRR number received.  Genelle BalVanessa Takelia Urieta, MSW, LCSW Licensed Clinical Social Worker Clinical Social Work Department [facility] [phone]

## 2016-09-03 NOTE — Progress Notes (Signed)
Occupational Therapy Treatment Patient Details Name: Alexis Sanders MRN: [id number] DOB: 10/25/1950 Today's Date: 09/03/2016    History of present illness Pt admitted with hypoxic respiratory failure from aspiration. Intubated 11/3-11/5. PMH: Parkinsons, DM, Bipolar.   OT comments  Pt self fed with moderate assist at bed level with head up at 90 degrees. Tolerated UB exercises and bed mobility for pericare and linen change.   Follow Up Recommendations  SNF;Supervision/Assistance - 24 hour    Equipment Recommendations       Recommendations for Other Services      Precautions / Restrictions Precautions Precautions: Fall       Mobility Bed Mobility Overal bed mobility: Needs Assistance Bed Mobility: Rolling Rolling: Max assist         General bed mobility comments: rolled to change bed linens and for pericare  Transfers                      Balance                                   ADL Overall ADL's : Needs assistance/impaired Eating/Feeding: Moderate assistance;Bed level (HOB up 90 degrees)   Grooming: Total assistance;Bed level Grooming Details (indicate cue type and reason): washed face and hands         Upper Body Dressing : Maximal assistance;Bed level           Toileting- Clothing Manipulation and Hygiene: Bed level;Total assistance Toileting - Clothing Manipulation Details (indicate cue type and reason): pt with urinary incontinence              Vision                     Perception     Praxis      Cognition   Behavior During Therapy: Flat affect Overall Cognitive Status: No family/caregiver present to determine baseline cognitive functioning                Problem Solving: Slow processing;Decreased initiation      Extremity/Trunk Assessment               Exercises Other Exercises Other Exercises: PROM B UEs within tolerance on R   Shoulder Instructions       General Comments       Pertinent Vitals/ Pain       Pain Assessment: No/denies pain  Home Living                                          Prior Functioning/Environment              Frequency  Min 2X/week        Progress Toward Goals  OT Goals(current goals can now be found in the care plan section)  Progress towards OT goals: Progressing toward goals  Acute Rehab OT Goals Time For Goal Achievement: 09/10/16 Potential to Achieve Goals: Fair  Plan Discharge plan remains appropriate    Co-evaluation                 End of Session     Activity Tolerance Patient tolerated treatment well   Patient Left in bed;with call bell/phone within reach;with bed alarm set   Nurse Communication  Time: [id number] OT Time Calculation (min): 30 min  Charges: OT General Charges $OT Visit: 1 Procedure OT Treatments $Self Care/Home Management : 8-22 mins $Therapeutic Exercise: 8-22 mins  Evern [employer], Daren Yeagle [place] 09/03/2016, 9:13 AM  [phone]

## 2016-09-03 NOTE — Progress Notes (Signed)
Hypoglycemic Event  CBG: 62  Treatment: 15 GM carbohydrate snack  Symptoms: None  Follow-up CBG: [id number] CBG Result:109  Possible Reasons for Event: Unknown  Comments/MD notified:N/a    Alexis Sanders Alexis Nextel CorporationCesar Sanders

## 2016-09-03 NOTE — Progress Notes (Signed)
PROGRESS NOTE    Alexis Sanders  TDV:[id number] DOB: May 02, 1951 DOA: 08/17/2016 PCP: Vicie Mutters, PA-C    Brief Narrative:  65 y.o.female with past medical history of parkinson's disease started on amantadine August 2017, DM2, Bipolar disorder, hypothyroidism who was brought to [facility] with confusion, tremors for 3-4 days prior to this admission. Patient also had reports of nonproductive cough. On admission, patient was febrile with chest x-ray showing bilateral infiltrates and UA consistent with UTI.  Patient monitored for PO intake and nutrition consulted for assessment of caloric needs and recommendations.   Assessment & Plan:   Principal Problem:   Septic shock ([place]) Active Problems:   Acute encephalopathy   UTI (urinary tract infection)   ARF (acute renal failure) (HCC)   Hypothyroidism   Bipolar 1 disorder (HCC)   Parkinson's disease ([place])   Diabetes mellitus type 2 in nonobese ([place])   Acute renal failure superimposed on stage 3 chronic kidney disease (HCC)   Aspiration pneumonia (HCC)   SOB (shortness of breath)   Acute respiratory failure with hypoxia (HCC)   Aspiration pneumonia of right lower lobe due to vomit (HCC)   FUO (fever of unknown origin)   Dysphagia   Palliative care encounter   Goals of care, counseling/discussion   Fever of unknown origin - No fevers - Initially, the thought was that patient has possible pneumonia versus urinary tract infection - Patient was broadly covered with Zithromax, Primaxin, meropenem. Meropenem stopped 08/26/2016. vancomycin which was started 08/26/2016 was topped on 11/14 - Vanco stopped yesterday - CT abd/pelvis and chest 11/9 - bilateral patch airspace disease suggest multi pulmonary infection, no intra-abdominal finding to account for fever  - Respiratory virus panel unremarkable - Blood cultures negative - Urine culture with multiple species, none predominant  Sepsis due to Bibasilar aspiration PNA +/-  UTI  - Vanco discontinued two days ago - Pneumonia seen on CT scan  - continue to monitor for signs of aspiration   Acute hypoxic respiratory failure secondary to aspiration pneumonitis - Intubated 11/3 - failed swallow eval but repeat eval 11/10 - puree type food apparently tolerated well by pt  - Stable respiratory status - at this time no signs of aspiration  AKI - Secondary to prerenal etiology, sepsis - Creatinine improving, 1.52 --> 1.36 --> 1.33--> 1.44 - will repeat BMP in am  Hypernatremia - Likely due to sepsis, volume depletion - Sodium now WNL - repeat BMP in am  Hypokalemia / Hyperkalemia  - repeat BMP in am  Hypophosphatemia - Replaced  Diabetes mellitus, uncontrolled without complications without long-term insulin use - A1c 6.5 - Patient takes glipizide at home - Patient on Lantus 12 units at bedtime along with sliding scale insulin - continue ACHS CBG checks - fasting BS WNL  Hypothyroidism - Continue levothyroxine 125 g daily  Drug induced Parkinson's disease - Continue amantadine   Bipolar disorder/Anxiety - Continue clonazepam, Wellbutrin, Abilify  Anemia of critical illness and chronic disease - Hemoglobin stable   Dysphagia / Severe protein calorie malnutrition  - Per SLP - dysphagia 1 diet as of 11/10 - continue to encourage PO intake and food - 25% of diet eaten but good intake of Glucerna and nutritional shakes - nutrition consult placed   DVT prophylaxis: Lovenox subQ Code Status: full code  Family Communication:  no family bedside Disposition Plan:  awaiting [facility] for placement   Consultants:   PCCM  Palliative Care  SLP  Procedures:   Intubation  Antimicrobials:  Rocephin 10/30 Zithromax 10/30 > 11/02 Primaxin 10/31>11/02 Meropenem 11/03 >11/8 Vancomycin 11/08 --> 09/01/2016    Subjective: Patient seen and evaluated.  Her breakfast tray shows that she ate all applesauce, yogurt, nutritional  shake but did not touch what was on her plate.  Patient states she is trying to eat more.  [facility] representative to see patient today.    Objective: Vitals:   09/02/16 2113 09/03/16 0430 09/03/16 0500 09/03/16 0900  BP: (!) 141/59 (!) 149/75  (!) 142/60  Pulse: 75 66  70  Resp: _0 Temp: 100 F (37.8 C) 98 F (36.7 C)  98.2 F (36.8 C)  TempSrc: Oral Oral  Oral  SpO2: 100% 96%  98%  Weight:   65.3 kg (143 lb 15.4 oz)   Height:        Intake/Output Summary (Last 24 hours) at 09/03/16 1549 Last data filed at 09/03/16 1230  Gross per 24 hour  Intake              767 ml  Output                0 ml  Net              767 ml   Filed Weights   08/31/16 2020 09/02/16 0500 09/03/16 0500  Weight: 65.8 kg (145 lb 1 oz) 65.7 kg (144 lb 13.5 oz) 65.3 kg (143 lb 15.4 oz)    Examination:  General exam: Appears calm and comfortable  Respiratory system: Clear to auscultation. Respiratory effort normal. Cardiovascular system: S1 & S2 heard, RRR. No JVD, murmurs, rubs, gallops or clicks. No pedal edema. Gastrointestinal system: Abdomen is nondistended, soft and nontender. No organomegaly or masses felt. Normal bowel sounds heard. Central nervous system: Alert and oriented to person and place. No focal neurological deficits. Extremities: weak in upper and lower extremities but can move all four spontaneously.   Skin: No rashes, lesions or ulcers Psychiatry:  Mood & affect appropriate. Question of insight    Data Reviewed: I have personally reviewed following labs and imaging studies  CBC:  Recent Labs Lab 08/30/16 0526 08/31/16 0538 09/01/16 0513 09/02/16 0535 09/03/16 0647  WBC 16.9* 17.9* 13.8* 9.5 7.9  HGB 10.3* 10.9* 10.9* 11.6* 10.8*  HCT 32.9* 34.6* 34.6* 38.1 34.7*  MCV 84.4 84.2 84.0 85.8 84.2  PLT 289 347 382 310 322   Basic Metabolic Panel:  Recent Labs Lab 08/28/16 0537 08/29/16 0607 08/30/16 0526 08/31/16 0538 09/02/16 0535  NA 144 144 142 142 144  K  4.0 5.5* 4.8 5.0 4.9  CL 114* 114* 112* 110 113*  CO2 21* 21* _1 GLUCOSE 180* 196* 189* 143* 127*  BUN 32* 28* 25* 24* 23*  CREATININE 1.52* 1.40* 1.36* 1.33* 1.44*  CALCIUM 8.7* 8.6* 8.8* 8.8* 9.1   GFR: Estimated Creatinine Clearance: 35.4 mL/min (by C-G formula based on SCr of 1.44 mg/dL (H)). Liver Function Tests: No results for input(s): AST, ALT, ALKPHOS, BILITOT, PROT, ALBUMIN in the last 168 hours. No results for input(s): LIPASE, AMYLASE in the last 168 hours. No results for input(s): AMMONIA in the last 168 hours. Coagulation Profile: No results for input(s): INR, PROTIME in the last 168 hours. Cardiac Enzymes: No results for input(s): CKTOTAL, CKMB, CKMBINDEX, TROPONINI in the last 168 hours. BNP (last 3 results) No results for input(s): PROBNP in the last 8760 hours. HbA1C: No results for input(s): HGBA1C in the last 72 hours. CBG:  Recent Labs Lab 09/03/16 0002 09/03/16 0126 09/03/16 0425 09/03/16 0759 09/03/16 1225  GLUCAP 62* 109* 106* 101* 189*   Lipid Profile: No results for input(s): CHOL, HDL, LDLCALC, TRIG, CHOLHDL, LDLDIRECT in the last 72 hours. Thyroid Function Tests: No results for input(s): TSH, T4TOTAL, FREET4, T3FREE, THYROIDAB in the last 72 hours. Anemia Panel: No results for input(s): VITAMINB12, FOLATE, FERRITIN, TIBC, IRON, RETICCTPCT in the last 72 hours. Sepsis Labs: No results for input(s): PROCALCITON, LATICACIDVEN in the last 168 hours.  Recent Results (from the past 240 hour(s))  Culture, Urine     Status: None   Collection Time: 08/26/16 12:26 PM  Result Value Ref Range Status   Specimen Description URINE, RANDOM  Final   Special Requests NONE  Final   Culture NO GROWTH  Final   Report Status 08/27/2016 FINAL  Final         Radiology Studies: No results found.      Scheduled Meds: . amantadine  100 mg Oral BID  . ARIPiprazole  10 mg Oral QPM  . buPROPion  100 mg Oral Daily  . chlorhexidine gluconate  (MEDLINE KIT)  15 mL Mouth Rinse BID  . clonazePAM  0.25 mg Oral BID  . enoxaparin (LOVENOX) injection  40 mg Subcutaneous Q24H  . feeding supplement (GLUCERNA SHAKE)  237 mL Oral TID BM  . feeding supplement (PRO-STAT SUGAR FREE 64)  30 mL Oral TID  . gabapentin  200 mg Oral BID  . insulin aspart  0-9 Units Subcutaneous Q4H  . insulin glargine  12 Units Subcutaneous QHS  . levothyroxine  125 mcg Oral QAC breakfast  . mouth rinse  15 mL Mouth Rinse BID  . pantoprazole sodium  40 mg Oral Daily  . sodium chloride flush  10-40 mL Intracatheter Q12H   Continuous Infusions: . sodium chloride 75 mL/hr at 09/03/16 0549     LOS: 17 days    Time spent: 35 minutes    Loretha Stapler, MD [facility] Pager [phone]  If 7PM-7AM, please contact night-coverage [email or web address] Password [facility] 09/03/2016, 3:49 PM

## 2016-09-04 LAB — GLUCOSE, CAPILLARY
GLUCOSE-CAPILLARY: 155 mg/dL — AB (ref 65–99)
GLUCOSE-CAPILLARY: 77 mg/dL (ref 65–99)
Glucose-Capillary: 107 mg/dL — ABNORMAL HIGH (ref 65–99)
Glucose-Capillary: 168 mg/dL — ABNORMAL HIGH (ref 65–99)

## 2016-09-04 MED ORDER — PRO-STAT SUGAR FREE PO LIQD: 30.0000 mL | Freq: Three times a day (TID) | ORAL | 0 refills | Status: AC

## 2016-09-04 MED ORDER — GLUCERNA SHAKE PO LIQD: 237.0000 mL | Freq: Three times a day (TID) | ORAL | 0 refills | Status: AC

## 2016-09-04 MED ORDER — GABAPENTIN 250 MG/5ML PO SOLN: 200.0000 mg | Freq: Two times a day (BID) | ORAL | 12 refills | Status: AC

## 2016-09-04 MED ORDER — INSULIN GLARGINE 100 UNIT/ML ~~LOC~~ SOLN: 12.0000 [IU] | Freq: Every day | SUBCUTANEOUS | 11 refills | Status: AC

## 2016-09-04 NOTE — Discharge Summary (Signed)
Physician Discharge Summary  Alexis Sanders ZOX:[id number] DOB: 08/25/1951 DOA: 08/17/2016  PCP: Quentin MullingAmanda Collier, PA-C  Admit date: 08/17/2016 Discharge date: 09/04/2016  Admitted From: Home Disposition:  S[facility]- [facility]  Recommendations for Outpatient Follow-up:  1. See MD at facility at first visit 2. Please obtain BMP/CBC in 3-4 days 3. Patient needs assistance with feeding   Home Health:No Equipment/Devices: none  Discharge Condition:Stable CODE STATUS: Full Code Diet recommendation: Dysphagia 1 diet with Nectar Thick liquids   Brief/Interim Summary: 65 y.o.female with past medical history of parkinson's disease started on amantadine August 2017, DM2, Bipolar disorder, hypothyroidism who [facility] with confusion, tremors for 3-4 days prior to this admission. Patient also had reports of nonproductive cough. On admission, patient was febrile with chest x-ray showing bilateral infiltrates and UA consistent with UTI.  Patient monitored for PO intake and nutrition consulted for assessment of caloric needs and recommendations however patient had increased PO intake when assisted with feeds.  She received a PASRR number and was stable for discharge to [facility].  Discharge Diagnoses:  Principal Problem:   Septic shock (HCC) Active Problems:   Acute encephalopathy   UTI (urinary tract infection)   ARF (acute renal failure) (HCC)   Hypothyroidism   Bipolar 1 disorder (HCC)   Parkinson's disease (HCC)   Diabetes mellitus type 2 in nonobese (HCC)   Acute renal failure superimposed on stage 3 chronic kidney disease (HCC)   Aspiration pneumonia (HCC)   SOB (shortness of breath)   Acute respiratory failure with hypoxia (HCC)   Aspiration pneumonia of right lower lobe due to vomit (HCC)   FUO (fever of unknown origin)   Dysphagia   Palliative care encounter   Goals of care, counseling/discussion  Fever of unknown origin - No fevers - Initially, the thought was  that patient has possible pneumonia versus urinary tract infection - Patient was broadly covered with Zithromax, Primaxin, meropenem. Meropenem stopped 08/26/2016. vancomycin which was started 08/26/2016 was stopped on 11/14 - CT abd/pelvis and chest 11/9 - bilateral patch airspace disease suggest multi pulmonary infection, no intra-abdominal finding to account for fever  - Respiratory virus panel unremarkable - Blood cultures negative - Urine culture with multiple species, none predominant  Sepsis due to Bibasilar aspiration PNA +/- UTI  - Vanco discontinued two days ago - Pneumonia seen on CT scan  - continue to monitor for signs of aspiration  - nectar thick liquids with dysphagia 1 diet  Acute hypoxic respiratory failure secondary to aspiration pneumonitis - Intubated 11/3 - failed swallow eval but repeat eval 11/10 - puree type food apparently tolerated well by pt  - Stable respiratory status - at this time no signs of aspiration - continue dysphagia 1 diet  AKI - Secondary to prerenal etiology, sepsis - Creatinine improving, 1.52 --> 1.36 --> 1.33--> 1.44 - will follow up outpatient   Hypernatremia - Likely due to sepsis, volume depletion - Sodium now WNL - will have patient get repeat BMP at facility  Hypokalemia / Hyperkalemia  - resolved  Hypophosphatemia - Replaced  Diabetes mellitus, uncontrolled without complications without long-term insulin use - A1c 6.5 - Patient on Lantus 12 units at bedtime along with sliding scale insulin - continue ACHS CBG checks - fasting BS WNL - will discharge on Lantus only  Hypothyroidism - Continue levothyroxine 125 g daily  Drug induced Parkinson's disease - Continue amantadine   Bipolar disorder/Anxiety - Continue clonazepam, Wellbutrin, Abilify  Anemia of critical illness and chronic  disease - Hemoglobin stable   Dysphagia / Severe protein calorie malnutrition  - Per SLP - dysphagia 1 diet as of  11/10 - continue to encourage PO intake and food - 25% of diet eaten but good intake of Glucerna and nutritional shakes - nutrition consult placed  Discharge Instructions  Discharge Instructions    Activity as tolerated - No restrictions    Complete by:  As directed    Call MD for:  difficulty breathing, headache or visual disturbances    Complete by:  As directed    Call MD for:  persistant dizziness or light-headedness    Complete by:  As directed    Call MD for:  persistant nausea and vomiting    Complete by:  As directed    Call MD for:  redness, tenderness, or signs of infection (pain, swelling, redness, odor or green/yellow discharge around incision site)    Complete by:  As directed    Call MD for:  severe uncontrolled pain    Complete by:  As directed    Call MD for:  temperature >100.4    Complete by:  As directed        Medication List    STOP taking these medications   gabapentin 300 MG capsule Commonly known as:  NEURONTIN Replaced by:  gabapentin 250 MG/5ML solution   glipiZIDE 2.5 MG 24 hr tablet Commonly known as:  GLUCOTROL XL   levofloxacin 750 MG tablet Commonly known as:  LEVAQUIN   traZODone 50 MG tablet Commonly known as:  DESYREL     TAKE these medications   amantadine 100 MG capsule Commonly known as:  SYMMETREL Take 100 mg by mouth 2 (two) times daily.   ARIPiprazole 10 MG tablet Commonly known as:  ABILIFY Take 10 mg by mouth every evening.   buPROPion 100 MG tablet Commonly known as:  WELLBUTRIN Take 100 mg by mouth daily.   clonazePAM 1 MG tablet Commonly known as:  KLONOPIN Take 1 mg by mouth 2 (two) times daily.   DULoxetine 30 MG capsule Commonly known as:  CYMBALTA Take 30 mg by mouth 2 (two) times daily.   feeding supplement (GLUCERNA SHAKE) Liqd Take 237 mLs by mouth 3 (three) times daily between meals.   feeding supplement (PRO-STAT SUGAR FREE 64) Liqd Take 30 mLs by mouth 3 (three) times daily.   gabapentin 250  MG/5ML solution Commonly known as:  NEURONTIN Take 4 mLs (200 mg total) by mouth 2 (two) times daily. Replaces:  gabapentin 300 MG capsule   insulin glargine 100 UNIT/ML injection Commonly known as:  LANTUS Inject 0.12 mLs (12 Units total) into the skin at bedtime.   levothyroxine 125 MCG tablet Commonly known as:  SYNTHROID, LEVOTHROID Take 125 mcg by mouth daily before breakfast.   montelukast 10 MG tablet Commonly known as:  SINGULAIR Take 10 mg by mouth at bedtime.   pantoprazole 40 MG tablet Commonly known as:  PROTONIX Take 40 mg by mouth every evening.   promethazine 25 MG tablet Commonly known as:  PHENERGAN Take 25 mg by mouth every 6 (six) hours as needed for nausea or vomiting.   solifenacin 5 MG tablet Commonly known as:  VESICARE Take 5 mg by mouth daily.       Allergies  Allergen Reactions  . Codeine Nausea Only  . Metformin Diarrhea  . Penicillins Rash    Has patient had a PCN reaction causing immediate rash, facial/tongue/throat swelling, SOB or lightheadedness with hypotension:Yes Has  patient had a PCN reaction causing severe rash involving mucus membranes or skin necrosis:unsure. Has patient had a PCN reaction that required hospitalization:No Has patient had a PCN reaction occurring within the last 10 years:No If all of the above answers are "NO", then may proceed with Cephalosporin use. Has patient had a PCN reaction causing immediate rash, faci    Consultations:  PT/OT  SLP  Palliative Care  Critical Care   Procedures/Studies: Ct Abdomen Pelvis Wo Contrast  Result Date: 08/27/2016 CLINICAL DATA:  FUO, 65 y.o. female with past medical history of parkinson's disease started on amantadine August 2017, DM2, Bipolar disorder, hypothyroidism who was brought to [facility] with confusion, tremors for 3-4 days prior to th.*comment was truncated* EXAM: CT CHEST, ABDOMEN, AND PELVIS WITHOUT  CONTRAST TECHNIQUE: Multidetector CT imaging of  the chest, abdomen and pelvis was performed without administration of intravenous contrast. CONTRAST:  100 mL Isovue COMPARISON:  None. FINDINGS: CT CHEST FINDINGS Cardiovascular: No significant vascular calcifications. No pericardial fluid. Mediastinum/Nodes: No axillary supraclavicular adenopathy. No mediastinal hilar adenopathy. Feeding tube extends in the esophagus. Lungs/Pleura: There is bilateral patchy airspace disease. Underline ground-glass opacities. Mild consolidation the LEFT lower lobe. No discrete nodularity. Musculoskeletal: No aggressive osseous lesion. CT ABDOMEN AND PELVIS FINDINGS Hepatobiliary: No focal hepatic lesions noncontrast exam. Post cholecystectomy. Pancreas: Pancreas is normal. No ductal dilatation. No pancreatic inflammation. Spleen: Normal spleen Adrenals/urinary tract: Adrenal glands normal. Kidneys, ureters bladder normal. Stomach/Bowel: Feeding tube extends into the duodenum. Small bowel chronic appendix and colon are normal. Vascular/Lymphatic: Abdominal aorta is normal caliber with atherosclerotic calcification. There is no retroperitoneal or periportal lymphadenopathy. No pelvic lymphadenopathy. Reproductive: Post hysterectomy Other: No free fluid. Musculoskeletal: No aggressive osseous lesion. IMPRESSION: Chest Impression: 1. Bilateral patchy airspace densities suggests multi pulmonary infection versus drug reaction. 2. Ground-glass opacities suggests underlying pulmonary edema. 3. Small bilateral pleural effusions. Abdomen / Pelvis Impression: 1. No evidence infection in the abdomen or pelvis. 2. Feeding tube with tip in the duodenum. 3. No bowel obstruction. Electronically Signed   By: Genevive Bi M.D.   On: 08/27/2016 16:08   Dg Chest 2 View  Result Date: 08/17/2016 CLINICAL DATA:  Altered mental status and shortness of breath EXAM: CHEST  2 VIEW COMPARISON:  None. FINDINGS: There is patchy airspace consolidation in both lower lobes and right upper lobe regions.  Lungs elsewhere clear. Heart is borderline enlarged with pulmonary vascularity within normal limits. No adenopathy. No bone lesions. IMPRESSION: Patchy airspace consolidation bilaterally, somewhat more on the right than on the left. While pneumonia can present in this manner, aspiration is a differential consideration given the overall appearance and distribution of abnormality. There is borderline cardiomegaly. Followup PA and lateral chest radiographs recommended in 3-4 weeks following trial of antibiotic therapy to ensure resolution and exclude underlying malignancy. Electronically Signed   By: Bretta Bang III M.D.   On: 08/17/2016 20:42   Ct Head Wo Contrast  Result Date: 08/18/2016 CLINICAL DATA:  Acute onset of altered mental status. Acute encephalopathy. Initial encounter. EXAM: CT HEAD WITHOUT CONTRAST TECHNIQUE: Contiguous axial images were obtained from the base of the skull through the vertex without intravenous contrast. COMPARISON:  None. FINDINGS: Brain: No evidence of acute infarction, hemorrhage, hydrocephalus, extra-axial collection or mass lesion/mass effect. Prominence of the ventricles and sulci reflects moderate cortical volume loss. Mild cerebellar atrophy is noted. Scattered periventricular white matter change likely reflects small vessel ischemic microangiopathy. The brainstem and fourth ventricle are within normal limits. The basal ganglia  are unremarkable in appearance. The cerebral hemispheres demonstrate grossly normal gray-white differentiation. No mass effect or midline shift is seen. Vascular: No hyperdense vessel or unexpected calcification. Skull: There is no evidence of fracture; visualized osseous structures are unremarkable in appearance. Sinuses/Orbits: The orbits are within normal limits. The paranasal sinuses and mastoid air cells are well-aerated. Other: No significant soft tissue abnormalities are seen. IMPRESSION: 1. No acute intracranial pathology seen on CT. 2.  Moderate cortical volume loss and scattered small vessel ischemic microangiopathy. Electronically Signed   By: Roanna RaiderJeffery  Chang M.D.   On: 08/18/2016 01:24   Ct Chest Wo Contrast  Result Date: 08/27/2016 CLINICAL DATA:  FUO, 65 y.o. female with past medical history of parkinson's disease started on amantadine August 2017, DM2, Bipolar disorder, hypothyroidism who was brought to [facility] with confusion, tremors for 3-4 days prior to th.*comment was truncated* EXAM: CT CHEST, ABDOMEN, AND PELVIS WITHOUT  CONTRAST TECHNIQUE: Multidetector CT imaging of the chest, abdomen and pelvis was performed without administration of intravenous contrast. CONTRAST:  100 mL Isovue COMPARISON:  None. FINDINGS: CT CHEST FINDINGS Cardiovascular: No significant vascular calcifications. No pericardial fluid. Mediastinum/Nodes: No axillary supraclavicular adenopathy. No mediastinal hilar adenopathy. Feeding tube extends in the esophagus. Lungs/Pleura: There is bilateral patchy airspace disease. Underline ground-glass opacities. Mild consolidation the LEFT lower lobe. No discrete nodularity. Musculoskeletal: No aggressive osseous lesion. CT ABDOMEN AND PELVIS FINDINGS Hepatobiliary: No focal hepatic lesions noncontrast exam. Post cholecystectomy. Pancreas: Pancreas is normal. No ductal dilatation. No pancreatic inflammation. Spleen: Normal spleen Adrenals/urinary tract: Adrenal glands normal. Kidneys, ureters bladder normal. Stomach/Bowel: Feeding tube extends into the duodenum. Small bowel chronic appendix and colon are normal. Vascular/Lymphatic: Abdominal aorta is normal caliber with atherosclerotic calcification. There is no retroperitoneal or periportal lymphadenopathy. No pelvic lymphadenopathy. Reproductive: Post hysterectomy Other: No free fluid. Musculoskeletal: No aggressive osseous lesion. IMPRESSION: Chest Impression: 1. Bilateral patchy airspace densities suggests multi pulmonary infection versus drug reaction. 2.  Ground-glass opacities suggests underlying pulmonary edema. 3. Small bilateral pleural effusions. Abdomen / Pelvis Impression: 1. No evidence infection in the abdomen or pelvis. 2. Feeding tube with tip in the duodenum. 3. No bowel obstruction. Electronically Signed   By: Genevive BiStewart  Edmunds M.D.   On: 08/27/2016 16:08   Dg Chest Port 1 View  Result Date: 08/26/2016 CLINICAL DATA:  Fever for unknown origin. EXAM: PORTABLE CHEST 1 VIEW COMPARISON:  08/23/2016 FINDINGS: Feeding catheter tip is collimated off the image, likely transpyloric. Cardiomediastinal silhouette is normal. Mediastinal contours appear intact. There is no evidence of pleural effusion or pneumothorax. Mild peribronchial airspace opacity in the left lower thorax are stable. There is mild pulmonary vascular congestion. Osseous structures are without acute abnormality. Soft tissues are grossly normal. Cholecystectomy clips noted. IMPRESSION: Mild persistent peribronchial airspace opacities in the left lower thorax. Mild pulmonary vascular congestion. Electronically Signed   By: Ted Mcalpineobrinka  Dimitrova M.D.   On: 08/26/2016 13:06   Dg Chest Port 1 View  Result Date: 08/23/2016 CLINICAL DATA:  Acute respiratory failure, diabetes mellitus, Parkinson's EXAM: PORTABLE CHEST 1 VIEW COMPARISON:  Portable exam 0408 hours compared to 08/22/2016 FINDINGS: Tip of endotracheal tube projects 2.8 cm above carina. Feeding tube extends through stomach. LEFT jugular central venous catheter with tip projecting over SVC. Normal heart size, mediastinal contours, and pulmonary vascularity. Slightly improved RIGHT basilar infiltrate. Mild retrocardiac LEFT lower lobe infiltrate suspected, new. Upper lungs clear. No pleural effusion or pneumothorax. IMPRESSION: Improved RIGHT basilar infiltrate with new mild retrocardiac LEFT lower lobe infiltrate.  Electronically Signed   By: Ulyses Southward M.D.   On: 08/23/2016 07:14   Dg Chest Port 1 View  Result Date:  08/22/2016 CLINICAL DATA:  Respiratory failure EXAM: PORTABLE CHEST 1 VIEW COMPARISON:  08/21/2016 FINDINGS: Cardiac shadow is stable. Endotracheal tube, feeding catheter and left jugular central line are noted in satisfactory position. The lungs are well aerated with some slight increased density in the right lung base although this may be projectional in nature. The overall inspiratory effort is decreased from the prior exam. Patchy changes in the left lung have improved in the interval. IMPRESSION: Slight increase in the degree of right basilar infiltrate. Electronically Signed   By: Alcide Clever M.D.   On: 08/22/2016 08:19   Dg Chest Port 1 View  Result Date: 08/21/2016 CLINICAL DATA:  Central line placement. EXAM: PORTABLE CHEST 1 VIEW COMPARISON:  August 21, 2016 FINDINGS: An ET tube is in good position. A new left central line terminates in the SVC. No pneumothorax. Bilateral pulmonary opacities, right greater than left, persist but are mildly improved. Recommend follow-up to complete resolution. IMPRESSION: Support apparatus is in good position.  No pneumothorax. Persistent but mildly improved bilateral pulmonary opacities. Electronically Signed   By: Gerome Sam III M.D   On: 08/21/2016 12:15   Dg Chest Port 1 View  Result Date: 08/21/2016 CLINICAL DATA:  Respiratory failure. EXAM: PORTABLE CHEST 1 VIEW COMPARISON:  08/20/2016.  08/19/2016. FINDINGS: Mediastinum and hilar structures normal. Heart size stable. Diffuse prominent bilateral airspace disease again noted with slight improvement from prior exam. No pleural effusion or pneumothorax. IMPRESSION: Diffuse prominent bilateral airspace disease again noted. Slight improvement from prior exam . Electronically Signed   By: Maisie Fus  Register   On: 08/21/2016 07:20   Dg Chest Port 1 View  Result Date: 08/20/2016 CLINICAL DATA:  UTI.  Pneumonia. EXAM: PORTABLE CHEST 1 VIEW COMPARISON:  08/19/2016 . FINDINGS: Heart size stable. Diffuse  bilateral pulmonary infiltrates are again noted. Findings consist with diffuse bilateral pneumonia. Pulmonary edema could also present this fashion. Small right pleural effusion cannot be excluded. No pneumothorax . IMPRESSION: Diffuse bilateral pulmonary infiltrates and/or edema. No interim change. Tiny right pleural effusion cannot be excluded. Electronically Signed   By: Maisie Fus  Register   On: 08/20/2016 08:12   Dg Chest Port 1 View  Result Date: 08/19/2016 CLINICAL DATA:  Shortness of breath. EXAM: PORTABLE CHEST 1 VIEW COMPARISON:  08/17/2016. FINDINGS: Mediastinum hilar structures are stable. Heart size stable. Diffuse bilateral pulmonary infiltrates are noted, these have increased from prior exam. These could represent bilateral infectious infiltrates or bilateral pulmonary edema. Small bilateral pleural effusions cannot be excluded. No pneumothorax. Thoracic spine scoliosis and degenerative change. Surgical clips right upper quadrant. IMPRESSION: Diffuse progressive bilateral pulmonary infiltrates consistent with pneumonia and/or bilateral pulmonary edema. Electronically Signed   By: Maisie Fus  Register   On: 08/19/2016 17:00   Dg Abd Portable 1v  Result Date: 08/21/2016 CLINICAL DATA:  Check feeding catheter placement EXAM: PORTABLE ABDOMEN - 1 VIEW COMPARISON:  None. FINDINGS: Contrast material is noted throughout the colon consistent with recent modified barium swallow. A feeding catheter is noted in the distal duodenum near the ligament of Treitz. Postsurgical changes are seen. No acute bony abnormality is noted. Changes of mild constipation are seen. IMPRESSION: Feeding catheter in satisfactory position distally in the duodenum. Electronically Signed   By: Alcide Clever M.D.   On: 08/21/2016 16:35   Dg Swallowing Func-speech Pathology  Result Date: 08/28/2016 Objective Swallowing Evaluation:  Type of Study: MBS-Modified Barium Swallow Study Patient Details Name: FIANA GLADU MRN: [id number]  Date of Birth: 1950-12-17 Today's Date: 08/28/2016 Time: SLP Start Time (ACUTE ONLY): 1027-SLP Stop Time (ACUTE ONLY): 1050 SLP Time Calculation (min) (ACUTE ONLY): 23 min Past Medical History: Past Medical History: Diagnosis Date . Diabetes mellitus without complication (HCC)  . Hypothyroidism  . Parkinson disease [facility])  Past Surgical History: Past Surgical History: Procedure Laterality Date . ABDOMINAL HYSTERECTOMY   . CHOLECYSTECTOMY   . TONSILLECTOMY   HPI: 65 year old female with a history of DM 2, hypothyroidism, CKD, bipolar disorder, and parkinsonism presentedwith 3-4 day history of increasing confusion and tremors. The patient was seen at [facility] on 08/16/2016. She was discharged from the emergency department with prescriptions for levofloxacin, promethazine, and albuterol. Unfortunately, the patient's symptoms persisted despite taking levofloxacin. As a result, the patient was brought to the emergency department at [facility] via EMS. Patient has had a nonproductive cough but no vomiting, diarrhea, dysuria. History was obtained from the patient's husband as the patient was encephalopathic. Workup in the emergency department revealed significant pyuria and chest x-ray showing bilateral consolidations, right greater than left.  Intubated 11/3-11/5.  Subjective: pt alert, soft spoken Assessment / Plan / Recommendation CHL IP CLINICAL IMPRESSIONS 08/28/2016 Therapy Diagnosis Mild oral phase dysphagia;Mild pharyngeal phase dysphagia Clinical Impression Pt presents with a mild oropharyngeal dysphagia similar in presentation to recent [facility] 08/20/16. Lingual and labial tremors result in reduced bolus cohesion and delayed oral transit of purees with mild lingual residue after the swallow. A straw facilitates oral control. She has a consistent delay in swallow initiation, which when paired with decreased airway closure, results in mild but silent penetration with thin liquids that cannot be cleared despite cued  coughing. Trials were kept limited due to pt reports of nausea and with dry heaving. Recommend Dys 1 diet and nectar thick liquids with possible potential for advancement with improvements in overall strength and respiratory function. Pt may be a good candidate for respiratory muscle strength training. Impact on safety and function Mild aspiration risk;Moderate aspiration risk   CHL IP TREATMENT RECOMMENDATION 08/28/2016 Treatment Recommendations Therapy as outlined in treatment plan below   Prognosis 08/28/2016 Prognosis for Safe Diet Advancement Good Barriers to Reach Goals Cognitive deficits Barriers/Prognosis Comment -- CHL IP DIET RECOMMENDATION 08/28/2016 SLP Diet Recommendations Dysphagia 1 (Puree) solids;Nectar thick liquid Liquid Administration via Straw Medication Administration Crushed with puree Compensations Slow rate;Small sips/bites Postural Changes Remain semi-upright after after feeds/meals (Comment);Seated upright at 90 degrees   CHL IP OTHER RECOMMENDATIONS 08/28/2016 Recommended Consults -- Oral Care Recommendations Oral care BID Other Recommendations Order thickener from pharmacy;Prohibited food (jello, ice cream, thin soups);Remove water pitcher   CHL IP FOLLOW UP RECOMMENDATIONS 08/28/2016 Follow up Recommendations Skilled Nursing facility   [facility] IP FREQUENCY AND DURATION 08/28/2016 Speech Therapy Frequency (ACUTE ONLY) min 2x/week Treatment Duration 2 weeks      CHL IP ORAL PHASE 08/28/2016 Oral Phase Impaired Oral - Pudding Teaspoon -- Oral - Pudding Cup -- Oral - Honey Teaspoon -- Oral - Honey Cup -- Oral - Nectar Teaspoon Decreased bolus cohesion Oral - Nectar Cup -- Oral - Nectar Straw Decreased bolus cohesion Oral - Thin Teaspoon Decreased bolus cohesion Oral - Thin Cup -- Oral - Thin Straw Decreased bolus cohesion Oral - Puree Weak lingual manipulation;Delayed oral transit;Decreased bolus cohesion;Lingual/palatal residue Oral - Mech Soft NT Oral - Regular -- Oral - Multi-Consistency --  Oral - Pill -- Oral Phase - Comment --  CHL IP PHARYNGEAL PHASE 08/28/2016 Pharyngeal Phase Impaired Pharyngeal- Pudding Teaspoon -- Pharyngeal -- Pharyngeal- Pudding Cup -- Pharyngeal -- Pharyngeal- Honey Teaspoon -- Pharyngeal -- Pharyngeal- Honey Cup -- Pharyngeal -- Pharyngeal- Nectar Teaspoon Delayed swallow initiation-vallecula;Reduced tongue base retraction;Pharyngeal residue - valleculae Pharyngeal -- Pharyngeal- Nectar Cup -- Pharyngeal -- Pharyngeal- Nectar Straw Delayed swallow initiation-vallecula;Reduced tongue base retraction;Pharyngeal residue - valleculae Pharyngeal -- Pharyngeal- Thin Teaspoon Delayed swallow initiation-vallecula;Reduced tongue base retraction;Pharyngeal residue - valleculae Pharyngeal -- Pharyngeal- Thin Cup -- Pharyngeal -- Pharyngeal- Thin Straw Reduced tongue base retraction;Pharyngeal residue - valleculae;Delayed swallow initiation-pyriform sinuses;Penetration/Aspiration before swallow Pharyngeal Material enters airway, remains ABOVE vocal cords and not ejected out Pharyngeal- Puree Delayed swallow initiation-vallecula;Reduced tongue base retraction;Pharyngeal residue - valleculae Pharyngeal -- Pharyngeal- Mechanical Soft NT Pharyngeal -- Pharyngeal- Regular -- Pharyngeal -- Pharyngeal- Multi-consistency -- Pharyngeal -- Pharyngeal- Pill -- Pharyngeal -- Pharyngeal Comment --  CHL IP CERVICAL ESOPHAGEAL PHASE 08/28/2016 Cervical Esophageal Phase WFL Pudding Teaspoon -- Pudding Cup -- Honey Teaspoon -- Honey Cup -- Nectar Teaspoon -- Nectar Cup -- Nectar Straw -- Thin Teaspoon -- Thin Cup -- Thin Straw -- Puree -- Mechanical Soft -- Regular -- Multi-consistency -- Pill -- Cervical Esophageal Comment -- No flowsheet data found. Maxcine Ham 08/28/2016, 1:36 PM Maxcine Ham, M.A. CCC-SLP [phone]              Dg Swallowing Func-speech Pathology  Result Date: 08/20/2016 Objective Swallowing Evaluation: Type of Study: MBS-Modified Barium Swallow Study Patient  Details Name: CASSAUNDRA RASCH MRN: [id number] Date of Birth: 09/25/1951 Today's Date: 08/20/2016 Time: SLP Start Time (ACUTE ONLY): 1310-SLP Stop Time (ACUTE ONLY): 1323 SLP Time Calculation (min) (ACUTE ONLY): 13 min Past Medical History: Past Medical History: Diagnosis Date . Diabetes mellitus without complication (HCC)  . Hypothyroidism  . Parkinson disease [facility])  Past Surgical History: Past Surgical History: Procedure Laterality Date . ABDOMINAL HYSTERECTOMY   . CHOLECYSTECTOMY   . TONSILLECTOMY   HPI: 65 year old female with a history of DM 2, hypothyroidism, CKD, bipolar disorder, and parkinsonism presentedwith 3-4 day history of increasing confusion and tremors. The patient was seen at [facility] on 08/16/2016. She was discharged from the emergency department with prescriptions for levofloxacin, promethazine, and albuterol. Unfortunately, the patient's symptoms persisted despite taking levofloxacin. As a result, the patient was brought to the emergency department at [facility] via EMS. Patient has had a nonproductive cough but no vomiting, diarrhea, dysuria. History was obtained from the patient's husband as the patient was encephalopathic. Workup in the emergency department revealed significant pyuria and chest x-ray showing bilateral consolidations, right greater than left. Subjective: pt alert Assessment / Plan / Recommendation CHL IP CLINICAL IMPRESSIONS 08/20/2016 Therapy Diagnosis Mild oral phase dysphagia;Mild pharyngeal phase dysphagia Clinical Impression Patient presents with a mild oropharyngeal dysphagia. Oral phase marked by lingual and labial tremors decreasing oral coordination of bolus however patient compensates well with use of straw which facilitates improved oral control of bolus. Decreased base of tongue strength and laryngeal closure (likely due to decline in respiratory function) result in mild silent penetration of thin liquids. Although use of chin tuck prevents frank penetration in 90% of  boluses, current respiratory function and high O2 needs increase aspiration risk. Recommend initiation of conservative diet with close f/u at bedside. Prognosis for ability to advance diet good with improved respiratory function.  Impact on safety and function Moderate aspiration risk   CHL IP TREATMENT RECOMMENDATION 08/20/2016 Treatment Recommendations Therapy as outlined in treatment plan below   Prognosis 08/20/2016 Prognosis for  Safe Diet Advancement Good Barriers to Reach Goals -- Barriers/Prognosis Comment -- CHL IP DIET RECOMMENDATION 08/20/2016 SLP Diet Recommendations Dysphagia 1 (Puree) solids;Nectar thick liquid Liquid Administration via Straw Medication Administration Whole meds with puree Compensations Slow rate;Small sips/bites Postural Changes Seated upright at 90 degrees   CHL IP OTHER RECOMMENDATIONS 08/20/2016 Recommended Consults -- Oral Care Recommendations Oral care BID Other Recommendations Order thickener from pharmacy;Prohibited food (jello, ice cream, thin soups);Remove water pitcher   CHL IP FOLLOW UP RECOMMENDATIONS 08/20/2016 Follow up Recommendations (No Data)   CHL IP FREQUENCY AND DURATION 08/20/2016 Speech Therapy Frequency (ACUTE ONLY) min 2x/week Treatment Duration 2 weeks      CHL IP ORAL PHASE 08/20/2016 Oral Phase Impaired Oral - Pudding Teaspoon -- Oral - Pudding Cup -- Oral - Honey Teaspoon -- Oral - Honey Cup -- Oral - Nectar Teaspoon -- Oral - Nectar Cup -- Oral - Nectar Straw -- Oral - Thin Teaspoon -- Oral - Thin Cup -- Oral - Thin Straw -- Oral - Puree Lingual/palatal residue Oral - Mech Soft Lingual/palatal residue Oral - Regular -- Oral - Multi-Consistency -- Oral - Pill -- Oral Phase - Comment labial, lingual tremors decreased coordination  CHL IP PHARYNGEAL PHASE 08/20/2016 Pharyngeal Phase Impaired Pharyngeal- Pudding Teaspoon -- Pharyngeal -- Pharyngeal- Pudding Cup -- Pharyngeal -- Pharyngeal- Honey Teaspoon -- Pharyngeal -- Pharyngeal- Honey Cup -- Pharyngeal --  Pharyngeal- Nectar Teaspoon Pharyngeal residue - valleculae Pharyngeal -- Pharyngeal- Nectar Cup -- Pharyngeal -- Pharyngeal- Nectar Straw Pharyngeal residue - valleculae Pharyngeal -- Pharyngeal- Thin Teaspoon -- Pharyngeal -- Pharyngeal- Thin Cup -- Pharyngeal -- Pharyngeal- Thin Straw Penetration/Aspiration during swallow;Reduced airway/laryngeal closure;Pharyngeal residue - valleculae Pharyngeal Material enters airway, remains ABOVE vocal cords and not ejected out Pharyngeal- Puree Delayed swallow initiation-vallecula;Pharyngeal residue - valleculae;Reduced tongue base retraction Pharyngeal -- Pharyngeal- Mechanical Soft Pharyngeal residue - valleculae Pharyngeal -- Pharyngeal- Regular -- Pharyngeal -- Pharyngeal- Multi-consistency -- Pharyngeal -- Pharyngeal- Pill -- Pharyngeal -- Pharyngeal Comment --  CHL IP CERVICAL ESOPHAGEAL PHASE 08/20/2016 Cervical Esophageal Phase WFL Pudding Teaspoon -- Pudding Cup -- Honey Teaspoon -- Honey Cup -- Nectar Teaspoon -- Nectar Cup -- Nectar Straw -- Thin Teaspoon -- Thin Cup -- Thin Straw -- Puree -- Mechanical Soft -- Regular -- Multi-consistency -- Pill -- Cervical Esophageal Comment -- Ferdinand Lango MA, CCC-SLP [phone] McCoy Leah Meryl 08/20/2016, 2:52 PM                Subjective: Patient seen and examined.  She is doing well and per nursing staff is eating well- eats everything that is fed to her.  Patient got PASRR number and will be discharged to [facility] today.  Denies any new symptoms but patient is minimally verbal.  Discharge Exam: Vitals:   09/03/16 2113 09/04/16 1000  BP: (!) 128/93 (!) 141/68  Pulse: 69 70  Resp: 18 18  Temp: 98.8 F (37.1 C) 97.4 F (36.3 C)   Vitals:   09/03/16 0900 09/03/16 1800 09/03/16 2113 09/04/16 1000  BP: (!) 142/60 (!) 145/65 (!) 128/93 (!) 141/68  Pulse: 70 75 69 70  Resp: 18 18 18 18   Temp: 98.2 F (36.8 C) 98.6 F (37 C) 98.8 F (37.1 C) 97.4 F (36.3 C)  TempSrc: Oral Oral Oral Oral  SpO2: 98% 99% 97%  97%  Weight:   63.4 kg (139 lb 12.4 oz)   Height:        General: Pt is alert, awake, not in acute distress Cardiovascular: RRR, S1/S2 +, no rubs,  no gallops Respiratory: CTA bilaterally, no wheezing, no rhonchi Abdominal: Soft, NT, ND, bowel sounds + Extremities: no edema, no cyanosis    The results of significant diagnostics from this hospitalization (including imaging, microbiology, ancillary and laboratory) are listed below for reference.     Microbiology: Recent Results (from the past 240 hour(s))  Culture, Urine     Status: None   Collection Time: 08/26/16 12:26 PM  Result Value Ref Range Status   Specimen Description URINE, RANDOM  Final   Special Requests NONE  Final   Culture NO GROWTH  Final   Report Status 08/27/2016 FINAL  Final     Labs: BNP (last 3 results) No results for input(s): BNP in the last 8760 hours. Basic Metabolic Panel:  Recent Labs Lab 08/29/16 0607 08/30/16 0526 08/31/16 0538 09/02/16 0535  NA 144 142 142 144  K 5.5* 4.8 5.0 4.9  CL 114* 112* 110 113*  CO2 21* 22 23 23   GLUCOSE 196* 189* 143* 127*  BUN 28* 25* 24* 23*  CREATININE 1.40* 1.36* 1.33* 1.44*  CALCIUM 8.6* 8.8* 8.8* 9.1   Liver Function Tests: No results for input(s): AST, ALT, ALKPHOS, BILITOT, PROT, ALBUMIN in the last 168 hours. No results for input(s): LIPASE, AMYLASE in the last 168 hours. No results for input(s): AMMONIA in the last 168 hours. CBC:  Recent Labs Lab 08/30/16 0526 08/31/16 0538 09/01/16 0513 09/02/16 0535 09/03/16 0647  WBC 16.9* 17.9* 13.8* 9.5 7.9  HGB 10.3* 10.9* 10.9* 11.6* 10.8*  HCT 32.9* 34.6* 34.6* 38.1 34.7*  MCV 84.4 84.2 84.0 85.8 84.2  PLT 289 347 382 310 312   Cardiac Enzymes: No results for input(s): CKTOTAL, CKMB, CKMBINDEX, TROPONINI in the last 168 hours. BNP: Invalid input(s): POCBNP CBG:  Recent Labs Lab 09/03/16 2115 09/04/16 0018 09/04/16 0341 09/04/16 0750 09/04/16 1156  GLUCAP 125* 155* 77 107* 168*    D-Dimer No results for input(s): DDIMER in the last 72 hours. Hgb A1c No results for input(s): HGBA1C in the last 72 hours. Lipid Profile No results for input(s): CHOL, HDL, LDLCALC, TRIG, CHOLHDL, LDLDIRECT in the last 72 hours. Thyroid function studies No results for input(s): TSH, T4TOTAL, T3FREE, THYROIDAB in the last 72 hours.  Invalid input(s): FREET3 Anemia work up No results for input(s): VITAMINB12, FOLATE, FERRITIN, TIBC, IRON, RETICCTPCT in the last 72 hours. Urinalysis    Component Value Date/Time   COLORURINE YELLOW 08/26/2016 1226   APPEARANCEUR CLEAR 08/26/2016 1226   LABSPEC 1.012 08/26/2016 1226   PHURINE 8.0 08/26/2016 1226   GLUCOSEU NEGATIVE 08/26/2016 1226   HGBUR NEGATIVE 08/26/2016 1226   BILIRUBINUR NEGATIVE 08/26/2016 1226   KETONESUR NEGATIVE 08/26/2016 1226   PROTEINUR NEGATIVE 08/26/2016 1226   NITRITE NEGATIVE 08/26/2016 1226   LEUKOCYTESUR NEGATIVE 08/26/2016 1226   Sepsis Labs Invalid input(s): PROCALCITONIN,  WBC,  LACTICIDVEN Microbiology Recent Results (from the past 240 hour(s))  Culture, Urine     Status: None   Collection Time: 08/26/16 12:26 PM  Result Value Ref Range Status   Specimen Description URINE, RANDOM  Final   Special Requests NONE  Final   Culture NO GROWTH  Final   Report Status 08/27/2016 FINAL  Final     Time coordinating discharge: Over 30 minutes  SIGNED:   Katrinka Blazing, MD  [facility] 09/04/2016, 12:14 PM Pager [phone] If 7PM-7AM, please contact night-coverage [email or web address] Password [email or web address]

## 2016-09-04 NOTE — Progress Notes (Addendum)
Patient discharged to NH, [facility]. Family aware. IV removed. Telemetry removed. Patient stable for Discharged. Report Given to RN. [facility].

## 2016-09-04 NOTE — Progress Notes (Addendum)
DC Summary in, CSW set up transportation with [facility] for 2PM to facility ([facility] [place]), pt's nurse updated. Spouse informed. All required docs placed on front on pt's chart.  Harjit Leider B. Reine JustBrown, LCSWA [phone]

## 2016-09-04 NOTE — Progress Notes (Signed)
CSW viewed  PASRR system this AM, pt's PASRR# is there. CSW contacted the facility ([facility]), pt's bed available. Pt will be going to [facility]. Nurse please call [phone] to give report. Pt's nurse updated, once DC summary is available CSW will set up transportation.  Yilin Weedon B. Reine JustBrown, LCSWA [phone]

## 2016-09-04 NOTE — Clinical Social Work Placement (Signed)
   CLINICAL SOCIAL WORK PLACEMENT  NOTE  Date:  09/04/2016  Patient Details  Name: Alexis Sanders MRN: [id number] Date of Birth: 08/08/1951  Clinical Social Work is seeking post-discharge placement for this patient at the Skilled  Nursing Facility level of care (*CSW will initial, date and re-position this form in  chart as items are completed):  Yes   Patient/family provided with [facility] Clinical Social Work Department's list of facilities offering this level of care within the geographic area requested by the patient (or if unable, by the patient's family).  Yes   Patient/family informed of their freedom to choose among providers that offer the needed level of care, that participate in Medicare, Medicaid or managed care program needed by the patient, have an available bed and are willing to accept the patient.  Yes   Patient/family informed of [facility]'s ownership interest [facility] and [facility], as well as of the fact that they are under no obligation to receive care at these facilities.  PASRR submitted to [facility] on 09/01/16     PASRR number received on     09/04/2016  Existing PASRR number confirmed on       FL2 transmitted to all facilities in geographic area requested by pt/family on       FL2 transmitted to all facilities within larger geographic area on 09/01/16     Patient informed that his/her managed care company has contracts with or will negotiate with certain facilities, including the following:        Yes   Patient/family informed of bed offers received.  Patient chooses bed at [facility] [place]     Physician recommends and patient chooses bed at      Patient to be transferred to [facility] [place] on 09/04/2016  Patient to be transferred to facility by [facility])     Patient family notified on  of transfer.09/04/2016  Name of family member notified:  Charlynne PanderSteve Vanhorne     PHYSICIAN Please sign FL2     Additional Comment:     _______________________________________________ Norlene DuelBROWN, Tauno Falotico B, LCSWA 09/04/2016, 12:41 PM

## 2016-09-04 NOTE — Progress Notes (Signed)
This RN assuming care of pt as of 2300. I agree with charted assessment. [name] Lokken RN 

## 2016-09-04 NOTE — Progress Notes (Signed)
Nutrition Follow-up  DOCUMENTATION CODES:   Not applicable  INTERVENTION:  Once discharged to SNF,  Recommend continuation of Glucerna Shake po TID (thickened to appropriate consistency), each supplement provides 220 kcal and 10 grams of protein.  Recommend continuation of 30 ml Prostat po TID, each supplement provides 100 kcal and 15 grams of protein.   NUTRITION DIAGNOSIS:   Inadequate oral intake related to inability to eat as evidenced by NPO status; diet advanced; improved  GOAL:   Patient will meet greater than or equal to 90% of their needs; met  MONITOR:   PO intake, Supplement acceptance, Labs, Weight trends, Skin, I & O's  REASON FOR ASSESSMENT:   Consult Enteral/tube feeding initiation and management  ASSESSMENT:   65 y.o. female with history of Parkinson's disease started on amantadine 3 months ago, diabetes mellitus type 2, bipolar disorder, hypothyroidism brought to the ER after patient was having increasing confusion and tremors.  Plans to discharge to SNF today. Per nursing staff, pt has been eating well. Pt currently has Glucerna Shake and Prostat ordered and has been consuming them. Recommend continuation of nutritional supplements at SNF. Meal completion has been 100% this AM.   Diet Order:  DIET - DYS 1 Room service appropriate? Yes; Fluid consistency: Nectar Thick  Skin:  Reviewed, no issues  Last BM:  11/16  Height:   Ht Readings from Last 1 Encounters:  08/21/16 _0  (1.6 m)    Weight:   Wt Readings from Last 1 Encounters:  09/03/16 139 lb 12.4 oz (63.4 kg)    Ideal Body Weight:  52.3 kg  BMI:  Body mass index is 24.76 kg/m.  Estimated Nutritional Needs:   Kcal:  1700-1900  Protein:  80-90 grams  Fluid:  1.7 - 1.9 L/day  EDUCATION NEEDS:   No education needs identified at this time  Corrin Parker, MS, RD, LDN Pager # [phone] After hours/ weekend pager # [phone]

## 2016-09-16 ENCOUNTER — Inpatient Hospital Stay (HOSPITAL_COMMUNITY)
Admission: EM | Admit: 2016-09-16 | Discharge: 2016-10-19 | DRG: 871 | Disposition: E | Payer: Medicare Other | Attending: Family Medicine | Admitting: Family Medicine

## 2016-09-16 ENCOUNTER — Inpatient Hospital Stay (HOSPITAL_COMMUNITY): Payer: Medicare Other

## 2016-09-16 ENCOUNTER — Encounter (HOSPITAL_COMMUNITY): Payer: Self-pay | Admitting: Emergency Medicine

## 2016-09-16 ENCOUNTER — Emergency Department (HOSPITAL_COMMUNITY): Payer: Medicare Other

## 2016-09-16 DIAGNOSIS — D649 Anemia, unspecified: Secondary | ICD-10-CM | POA: Diagnosis present

## 2016-09-16 DIAGNOSIS — Z79899 Other long term (current) drug therapy: Secondary | ICD-10-CM

## 2016-09-16 DIAGNOSIS — E872 Acidosis: Secondary | ICD-10-CM | POA: Diagnosis present

## 2016-09-16 DIAGNOSIS — G9340 Encephalopathy, unspecified: Secondary | ICD-10-CM | POA: Diagnosis present

## 2016-09-16 DIAGNOSIS — J189 Pneumonia, unspecified organism: Secondary | ICD-10-CM | POA: Diagnosis not present

## 2016-09-16 DIAGNOSIS — R0602 Shortness of breath: Secondary | ICD-10-CM

## 2016-09-16 DIAGNOSIS — E039 Hypothyroidism, unspecified: Secondary | ICD-10-CM | POA: Diagnosis present

## 2016-09-16 DIAGNOSIS — Z7189 Other specified counseling: Secondary | ICD-10-CM | POA: Diagnosis not present

## 2016-09-16 DIAGNOSIS — E1122 Type 2 diabetes mellitus with diabetic chronic kidney disease: Secondary | ICD-10-CM | POA: Diagnosis present

## 2016-09-16 DIAGNOSIS — G20 Parkinson's disease: Secondary | ICD-10-CM | POA: Diagnosis present

## 2016-09-16 DIAGNOSIS — Z66 Do not resuscitate: Secondary | ICD-10-CM | POA: Diagnosis not present

## 2016-09-16 DIAGNOSIS — Y95 Nosocomial condition: Secondary | ICD-10-CM | POA: Diagnosis present

## 2016-09-16 DIAGNOSIS — J811 Chronic pulmonary edema: Secondary | ICD-10-CM | POA: Diagnosis present

## 2016-09-16 DIAGNOSIS — K219 Gastro-esophageal reflux disease without esophagitis: Secondary | ICD-10-CM | POA: Diagnosis present

## 2016-09-16 DIAGNOSIS — R6520 Severe sepsis without septic shock: Secondary | ICD-10-CM | POA: Diagnosis present

## 2016-09-16 DIAGNOSIS — E860 Dehydration: Secondary | ICD-10-CM

## 2016-09-16 DIAGNOSIS — I248 Other forms of acute ischemic heart disease: Secondary | ICD-10-CM | POA: Diagnosis present

## 2016-09-16 DIAGNOSIS — B9562 Methicillin resistant Staphylococcus aureus infection as the cause of diseases classified elsewhere: Secondary | ICD-10-CM

## 2016-09-16 DIAGNOSIS — I160 Hypertensive urgency: Secondary | ICD-10-CM | POA: Diagnosis present

## 2016-09-16 DIAGNOSIS — Z515 Encounter for palliative care: Secondary | ICD-10-CM | POA: Diagnosis not present

## 2016-09-16 DIAGNOSIS — N183 Chronic kidney disease, stage 3 (moderate): Secondary | ICD-10-CM | POA: Diagnosis present

## 2016-09-16 DIAGNOSIS — A419 Sepsis, unspecified organism: Secondary | ICD-10-CM

## 2016-09-16 DIAGNOSIS — J15212 Pneumonia due to Methicillin resistant Staphylococcus aureus: Secondary | ICD-10-CM | POA: Diagnosis present

## 2016-09-16 DIAGNOSIS — N179 Acute kidney failure, unspecified: Secondary | ICD-10-CM | POA: Diagnosis present

## 2016-09-16 DIAGNOSIS — J9600 Acute respiratory failure, unspecified whether with hypoxia or hypercapnia: Secondary | ICD-10-CM | POA: Diagnosis present

## 2016-09-16 DIAGNOSIS — E038 Other specified hypothyroidism: Secondary | ICD-10-CM | POA: Diagnosis not present

## 2016-09-16 DIAGNOSIS — I10 Essential (primary) hypertension: Secondary | ICD-10-CM | POA: Diagnosis present

## 2016-09-16 DIAGNOSIS — F319 Bipolar disorder, unspecified: Secondary | ICD-10-CM | POA: Diagnosis present

## 2016-09-16 DIAGNOSIS — E870 Hyperosmolality and hypernatremia: Secondary | ICD-10-CM | POA: Diagnosis present

## 2016-09-16 DIAGNOSIS — Z880 Allergy status to penicillin: Secondary | ICD-10-CM

## 2016-09-16 DIAGNOSIS — A4102 Sepsis due to Methicillin resistant Staphylococcus aureus: Principal | ICD-10-CM | POA: Diagnosis present

## 2016-09-16 DIAGNOSIS — G8929 Other chronic pain: Secondary | ICD-10-CM | POA: Diagnosis present

## 2016-09-16 DIAGNOSIS — R7881 Bacteremia: Secondary | ICD-10-CM | POA: Diagnosis not present

## 2016-09-16 DIAGNOSIS — Z794 Long term (current) use of insulin: Secondary | ICD-10-CM

## 2016-09-16 DIAGNOSIS — D696 Thrombocytopenia, unspecified: Secondary | ICD-10-CM | POA: Diagnosis present

## 2016-09-16 DIAGNOSIS — N390 Urinary tract infection, site not specified: Secondary | ICD-10-CM | POA: Diagnosis present

## 2016-09-16 DIAGNOSIS — N3000 Acute cystitis without hematuria: Secondary | ICD-10-CM

## 2016-09-16 DIAGNOSIS — B379 Candidiasis, unspecified: Secondary | ICD-10-CM

## 2016-09-16 DIAGNOSIS — E119 Type 2 diabetes mellitus without complications: Secondary | ICD-10-CM | POA: Diagnosis not present

## 2016-09-16 DIAGNOSIS — R0603 Acute respiratory distress: Secondary | ICD-10-CM

## 2016-09-16 DIAGNOSIS — Z87891 Personal history of nicotine dependence: Secondary | ICD-10-CM

## 2016-09-16 HISTORY — DX: Bipolar disorder, unspecified: F31.9

## 2016-09-16 LAB — COMPREHENSIVE METABOLIC PANEL
ALK PHOS: 169 U/L — AB (ref 38–126)
ALT: 98 U/L — AB (ref 14–54)
ANION GAP: 17 — AB (ref 5–15)
AST: 74 U/L — ABNORMAL HIGH (ref 15–41)
Albumin: 3.6 g/dL (ref 3.5–5.0)
BUN: 135 mg/dL — ABNORMAL HIGH (ref 6–20)
CALCIUM: 9.2 mg/dL (ref 8.9–10.3)
CHLORIDE: 128 mmol/L — AB (ref 101–111)
CO2: 19 mmol/L — AB (ref 22–32)
CREATININE: 6.45 mg/dL — AB (ref 0.44–1.00)
GFR, EST AFRICAN AMERICAN: 7 mL/min — AB (ref >60)
GFR, EST NON AFRICAN AMERICAN: 6 mL/min — AB (ref >60)
Glucose, Bld: 209 mg/dL — ABNORMAL HIGH (ref 65–99)
Potassium: 5 mmol/L (ref 3.5–5.1)
SODIUM: 164 mmol/L — AB (ref 135–145)
Total Bilirubin: 0.7 mg/dL (ref 0.3–1.2)
Total Protein: 7 g/dL (ref 6.5–8.1)

## 2016-09-16 LAB — I-STAT ARTERIAL BLOOD GAS, ED
Acid-base deficit: 10 mmol/L — ABNORMAL HIGH (ref 0.0–2.0)
BICARBONATE: 15.2 mmol/L — AB (ref 20.0–28.0)
O2 SAT: 96 %
TCO2: 16 mmol/L (ref 0–100)
pCO2 arterial: 31.8 mmHg — ABNORMAL LOW (ref 32.0–48.0)
pH, Arterial: 7.288 — ABNORMAL LOW (ref 7.350–7.450)
pO2, Arterial: 92 mmHg (ref 83.0–108.0)

## 2016-09-16 LAB — LACTIC ACID, PLASMA
Lactic Acid, Venous: 1.9 mmol/L (ref 0.5–1.9)
Lactic Acid, Venous: 2.2 mmol/L (ref 0.5–1.9)

## 2016-09-16 LAB — CBC WITH DIFFERENTIAL/PLATELET
BASOS ABS: 0 10*3/uL (ref 0.0–0.1)
BASOS PCT: 0 %
EOS ABS: 0 10*3/uL (ref 0.0–0.7)
Eosinophils Relative: 0 %
HCT: 44.6 % (ref 36.0–46.0)
Hemoglobin: 13.6 g/dL (ref 12.0–15.0)
Lymphocytes Relative: 10 %
Lymphs Abs: 2 10*3/uL (ref 0.7–4.0)
MCH: 27.6 pg (ref 26.0–34.0)
MCHC: 30.5 g/dL (ref 30.0–36.0)
MCV: 90.7 fL (ref 78.0–100.0)
MONO ABS: 0.8 10*3/uL (ref 0.1–1.0)
Monocytes Relative: 4 %
NEUTROS PCT: 86 %
Neutro Abs: 17.3 10*3/uL — ABNORMAL HIGH (ref 1.7–7.7)
PLATELETS: 145 10*3/uL — AB (ref 150–400)
RBC: 4.92 MIL/uL (ref 3.87–5.11)
RDW: 18 % — ABNORMAL HIGH (ref 11.5–15.5)
WBC: 20.1 10*3/uL — AB (ref 4.0–10.5)

## 2016-09-16 LAB — BASIC METABOLIC PANEL
BUN: 114 mg/dL — ABNORMAL HIGH (ref 6–20)
BUN: 111 mg/dL — ABNORMAL HIGH (ref 6–20)
BUN: 113 mg/dL — ABNORMAL HIGH (ref 6–20)
CALCIUM: 7.5 mg/dL — AB (ref 8.9–10.3)
CALCIUM: 7.9 mg/dL — AB (ref 8.9–10.3)
CALCIUM: 8.3 mg/dL — AB (ref 8.9–10.3)
CO2: 20 mmol/L — AB (ref 22–32)
CO2: 14 mmol/L — AB (ref 22–32)
CO2: 19 mmol/L — AB (ref 22–32)
Chloride: >130 mmol/L (ref 101–111)
Chloride: >130 mmol/L (ref 101–111)
Creatinine, Ser: 5.17 mg/dL — ABNORMAL HIGH (ref 0.44–1.00)
Creatinine, Ser: 5.25 mg/dL — ABNORMAL HIGH (ref 0.44–1.00)
Creatinine, Ser: 5.26 mg/dL — ABNORMAL HIGH (ref 0.44–1.00)
GFR calc non Af Amer: 8 mL/min — ABNORMAL LOW (ref >60)
GFR calc non Af Amer: 8 mL/min — ABNORMAL LOW (ref >60)
GFR, EST AFRICAN AMERICAN: 9 mL/min — AB (ref >60)
GFR, EST AFRICAN AMERICAN: 9 mL/min — AB (ref >60)
GFR, EST AFRICAN AMERICAN: 9 mL/min — AB (ref >60)
GFR, EST NON AFRICAN AMERICAN: 8 mL/min — AB (ref >60)
GLUCOSE: 210 mg/dL — AB (ref 65–99)
GLUCOSE: 199 mg/dL — AB (ref 65–99)
GLUCOSE: 128 mg/dL — AB (ref 65–99)
POTASSIUM: 4.8 mmol/L (ref 3.5–5.1)
POTASSIUM: 4.4 mmol/L (ref 3.5–5.1)
POTASSIUM: 4.5 mmol/L (ref 3.5–5.1)
Sodium: 161 mmol/L (ref 135–145)
Sodium: 156 mmol/L — ABNORMAL HIGH (ref 135–145)
Sodium: 159 mmol/L — ABNORMAL HIGH (ref 135–145)

## 2016-09-16 LAB — CBG MONITORING, ED
GLUCOSE-CAPILLARY: 223 mg/dL — AB (ref 65–99)
Glucose-Capillary: 197 mg/dL — ABNORMAL HIGH (ref 65–99)

## 2016-09-16 LAB — URINALYSIS, ROUTINE W REFLEX MICROSCOPIC
GLUCOSE, UA: NEGATIVE mg/dL
KETONES UR: 15 mg/dL — AB
Nitrite: NEGATIVE
PROTEIN: 100 mg/dL — AB
Specific Gravity, Urine: 1.019 (ref 1.005–1.030)
pH: 5 (ref 5.0–8.0)

## 2016-09-16 LAB — URINE MICROSCOPIC-ADD ON

## 2016-09-16 LAB — I-STAT VENOUS BLOOD GAS, ED
Acid-base deficit: 10 mmol/L — ABNORMAL HIGH (ref 0.0–2.0)
BICARBONATE: 15.4 mmol/L — AB (ref 20.0–28.0)
O2 Saturation: 73 %
PH VEN: 7.294 (ref 7.250–7.430)
PO2 VEN: 42 mmHg (ref 32.0–45.0)
TCO2: 16 mmol/L (ref 0–100)
pCO2, Ven: 31.8 mmHg — ABNORMAL LOW (ref 44.0–60.0)

## 2016-09-16 LAB — CREATININE, URINE, RANDOM
CREATININE, URINE: 55.96 mg/dL
Creatinine, Urine: 111.26 mg/dL

## 2016-09-16 LAB — GLUCOSE, CAPILLARY
GLUCOSE-CAPILLARY: 112 mg/dL — AB (ref 65–99)
Glucose-Capillary: 120 mg/dL — ABNORMAL HIGH (ref 65–99)

## 2016-09-16 LAB — PHOSPHORUS: Phosphorus: 5 mg/dL — ABNORMAL HIGH (ref 2.5–4.6)

## 2016-09-16 LAB — TROPONIN I
TROPONIN I: 0.05 ng/mL — AB (ref <0.03)
TROPONIN I: 0.05 ng/mL — AB (ref <0.03)
Troponin I: 0.06 ng/mL (ref <0.03)

## 2016-09-16 LAB — MAGNESIUM: MAGNESIUM: 2.8 mg/dL — AB (ref 1.7–2.4)

## 2016-09-16 LAB — PROTIME-INR
INR: 1.37
Prothrombin Time: 17 seconds — ABNORMAL HIGH (ref 11.4–15.2)

## 2016-09-16 LAB — I-STAT CG4 LACTIC ACID, ED
LACTIC ACID, VENOUS: 1.97 mmol/L — AB (ref 0.5–1.9)
Lactic Acid, Venous: 1.47 mmol/L (ref 0.5–1.9)

## 2016-09-16 LAB — TSH: TSH: 0.705 u[IU]/mL (ref 0.350–4.500)

## 2016-09-16 LAB — SODIUM, URINE, RANDOM: Sodium, Ur: 28 mmol/L

## 2016-09-16 LAB — CK: CK TOTAL: 58 U/L (ref 38–234)

## 2016-09-16 LAB — URIC ACID: URIC ACID, SERUM: 13.4 mg/dL — AB (ref 2.3–6.6)

## 2016-09-16 MED ORDER — LEVOTHYROXINE SODIUM 125 MCG PO TABS: 125.0000 ug | ORAL_TABLET | Freq: Every day | ORAL | Status: DC

## 2016-09-16 MED ORDER — DULOXETINE HCL 30 MG PO CPEP: 30.0000 mg | ORAL_CAPSULE | Freq: Two times a day (BID) | ORAL | Status: DC

## 2016-09-16 MED ORDER — SODIUM CHLORIDE 0.9 % IV BOLUS (SEPSIS)
1000.0000 mL | Freq: Once | INTRAVENOUS | Status: AC
  Administered 2016-09-16: 1000 mL via INTRAVENOUS

## 2016-09-16 MED ORDER — HYDRALAZINE HCL 20 MG/ML IJ SOLN: 10.0000 mg | Freq: Three times a day (TID) | INTRAMUSCULAR | Status: DC | PRN

## 2016-09-16 MED ORDER — PIPERACILLIN-TAZOBACTAM 3.375 G IVPB
3.3750 g | Freq: Three times a day (TID) | INTRAVENOUS | Status: DC
  Administered 2016-09-16: 3.375 g via INTRAVENOUS
  Filled 2016-09-16: qty 50

## 2016-09-16 MED ORDER — VANCOMYCIN HCL IN DEXTROSE 1-5 GM/200ML-% IV SOLN
1000.0000 mg | Freq: Once | INTRAVENOUS | Status: AC
  Administered 2016-09-16: 1000 mg via INTRAVENOUS
  Filled 2016-09-16: qty 200

## 2016-09-16 MED ORDER — BUPROPION HCL 100 MG PO TABS: 100.0000 mg | ORAL_TABLET | Freq: Every day | ORAL | Status: DC

## 2016-09-16 MED ORDER — INSULIN ASPART 100 UNIT/ML ~~LOC~~ SOLN
0.0000 [IU] | SUBCUTANEOUS | Status: DC
  Administered 2016-09-16: 5 [IU] via SUBCUTANEOUS
  Administered 2016-09-16: 3 [IU] via SUBCUTANEOUS
  Administered 2016-09-17: 2 [IU] via SUBCUTANEOUS
  Administered 2016-09-18: 3 [IU] via SUBCUTANEOUS
  Administered 2016-09-18: 2 [IU] via SUBCUTANEOUS
  Filled 2016-09-16: qty 1

## 2016-09-16 MED ORDER — INSULIN ASPART 100 UNIT/ML ~~LOC~~ SOLN
0.0000 [IU] | Freq: Three times a day (TID) | SUBCUTANEOUS | Status: DC
  Filled 2016-09-16: qty 1

## 2016-09-16 MED ORDER — DARIFENACIN HYDROBROMIDE ER 7.5 MG PO TB24: 7.5000 mg | ORAL_TABLET | Freq: Every day | ORAL | Status: DC

## 2016-09-16 MED ORDER — PROMETHAZINE HCL 25 MG PO TABS: 25.0000 mg | ORAL_TABLET | Freq: Four times a day (QID) | ORAL | Status: DC | PRN

## 2016-09-16 MED ORDER — FLUCONAZOLE 100 MG PO TABS: 150.0000 mg | ORAL_TABLET | Freq: Once | ORAL | Status: DC

## 2016-09-16 MED ORDER — PANTOPRAZOLE SODIUM 40 MG PO TBEC: 40.0000 mg | DELAYED_RELEASE_TABLET | Freq: Every evening | ORAL | Status: DC

## 2016-09-16 MED ORDER — PIPERACILLIN-TAZOBACTAM IN DEX 2-0.25 GM/50ML IV SOLN
2.2500 g | Freq: Three times a day (TID) | INTRAVENOUS | Status: DC
  Filled 2016-09-16 (×2): qty 50

## 2016-09-16 MED ORDER — ASPIRIN 300 MG RE SUPP
300.0000 mg | Freq: Once | RECTAL | Status: AC
  Administered 2016-09-16: 300 mg via RECTAL
  Filled 2016-09-16: qty 1

## 2016-09-16 MED ORDER — FLUMAZENIL 0.5 MG/5ML IV SOLN: 0.2000 mg | Freq: Once | INTRAVENOUS | Status: DC | PRN

## 2016-09-16 MED ORDER — CEFEPIME HCL 2 G IJ SOLR
500.0000 mg | INTRAMUSCULAR | Status: DC
  Administered 2016-09-16 – 2016-09-17 (×2): 500 mg via INTRAVENOUS
  Filled 2016-09-16 (×4): qty 0.5

## 2016-09-16 MED ORDER — ASPIRIN 300 MG RE SUPP: 300.0000 mg | Freq: Once | RECTAL | Status: DC

## 2016-09-16 MED ORDER — SODIUM CHLORIDE 0.9 % IV BOLUS (SEPSIS)
500.0000 mL | Freq: Once | INTRAVENOUS | Status: AC
  Administered 2016-09-16: 500 mL via INTRAVENOUS

## 2016-09-16 MED ORDER — FLUMAZENIL 0.5 MG/5ML IV SOLN
0.3000 mg | Freq: Once | INTRAVENOUS | Status: AC
  Administered 2016-09-16: 0.3 mg via INTRAVENOUS
  Filled 2016-09-16: qty 5

## 2016-09-16 MED ORDER — VANCOMYCIN HCL IN DEXTROSE 750-5 MG/150ML-% IV SOLN: 750.0000 mg | INTRAVENOUS | Status: DC

## 2016-09-16 MED ORDER — ASPIRIN 81 MG PO CHEW: 324.0000 mg | CHEWABLE_TABLET | Freq: Every day | ORAL | Status: DC

## 2016-09-16 MED ORDER — ARIPIPRAZOLE 10 MG PO TABS: 10.0000 mg | ORAL_TABLET | Freq: Every evening | ORAL | Status: DC

## 2016-09-16 MED ORDER — CLONAZEPAM 0.5 MG PO TABS
1.0000 mg | ORAL_TABLET | Freq: Two times a day (BID) | ORAL | Status: DC
Start: 2016-09-16 — End: 2016-09-16

## 2016-09-16 MED ORDER — ALBUTEROL SULFATE (2.5 MG/3ML) 0.083% IN NEBU: 2.5000 mg | INHALATION_SOLUTION | RESPIRATORY_TRACT | Status: DC | PRN

## 2016-09-16 MED ORDER — ACETAMINOPHEN 325 MG PO TABS: 650.0000 mg | ORAL_TABLET | Freq: Four times a day (QID) | ORAL | Status: DC | PRN

## 2016-09-16 MED ORDER — FLUCONAZOLE IN SODIUM CHLORIDE 200-0.9 MG/100ML-% IV SOLN
200.0000 mg | INTRAVENOUS | Status: DC
Start: 2016-09-16 — End: 2016-09-17
  Administered 2016-09-16 – 2016-09-17 (×2): 200 mg via INTRAVENOUS
  Filled 2016-09-16 (×2): qty 100

## 2016-09-16 MED ORDER — PIPERACILLIN-TAZOBACTAM 3.375 G IVPB 30 MIN: 3.3750 g | Freq: Once | INTRAVENOUS | Status: DC

## 2016-09-16 MED ORDER — SODIUM CHLORIDE 0.45 % IV SOLN: INTRAVENOUS | Status: DC

## 2016-09-16 MED ORDER — ACETAMINOPHEN 650 MG RE SUPP
650.0000 mg | Freq: Four times a day (QID) | RECTAL | Status: DC | PRN
  Administered 2016-09-17 – 2016-09-18 (×3): 650 mg via RECTAL
  Filled 2016-09-16 (×3): qty 1

## 2016-09-16 MED ORDER — AMANTADINE HCL 100 MG PO CAPS: 100.0000 mg | ORAL_CAPSULE | Freq: Two times a day (BID) | ORAL | Status: DC

## 2016-09-16 MED ORDER — SODIUM CHLORIDE 0.9% FLUSH
3.0000 mL | Freq: Two times a day (BID) | INTRAVENOUS | Status: DC
  Administered 2016-09-16 – 2016-09-18 (×3): 3 mL via INTRAVENOUS

## 2016-09-16 MED ORDER — DEXTROSE 5 % IV SOLN
Freq: Once | INTRAVENOUS | Status: AC
  Administered 2016-09-16: 125 mL via INTRAVENOUS

## 2016-09-16 MED ORDER — DEXTROSE-NACL 5-0.45 % IV SOLN
INTRAVENOUS | Status: DC
  Administered 2016-09-16: 17:00:00 via INTRAVENOUS

## 2016-09-16 MED ORDER — HEPARIN SODIUM (PORCINE) 5000 UNIT/ML IJ SOLN
5000.0000 [IU] | Freq: Three times a day (TID) | INTRAMUSCULAR | Status: DC
  Administered 2016-09-16 – 2016-09-18 (×6): 5000 [IU] via SUBCUTANEOUS
  Filled 2016-09-16 (×5): qty 1

## 2016-09-16 MED ORDER — ONDANSETRON HCL 4 MG PO TABS: 4.0000 mg | ORAL_TABLET | Freq: Four times a day (QID) | ORAL | Status: DC | PRN

## 2016-09-16 MED ORDER — ONDANSETRON HCL 4 MG/2ML IJ SOLN
4.0000 mg | Freq: Four times a day (QID) | INTRAMUSCULAR | Status: DC | PRN
Start: 2016-09-16 — End: 2016-09-21

## 2016-09-16 NOTE — ED Notes (Signed)
Report given to 3S RN

## 2016-09-16 NOTE — ED Triage Notes (Signed)
Pt brought to ED by [facility] from [facility] for rehab from urosepsis got there on 09/04/16, LSW last night at 2300, baseline AO x3, today mostly no verbal, only responding to pain and to voice. Temp this morning 101, 600 mg Tylenol rectally given, 800 mL NS given PTA to ED., HR 120-130, BP 101/76, R-30-40, SPO2 on RA 85%, 96% on 4 L [place], CBG 206.

## 2016-09-16 NOTE — ED Notes (Signed)
Pt family remains at bedside. Pt has tremors and is fidgety in bed. BP has increased, MD aware while at bedside. Pt sheets changed, repositioned in bed. Lights turned down, will continue to monitor.

## 2016-09-16 NOTE — Progress Notes (Addendum)
Pharmacy Antibiotic Note  Alexis Sanders is a 65 y.o. female admitted from [facility] on 15-Jul-2016 with fevers, possible sepsis.  Pharmacy has been consulted for Vancomycin, aztreonam and dilfucan dosing.  Wt 63 kg, WBC elevated at 20.1, ANC 17.3, creatinine elevated at 6.45 (was 1.44 on 09/02/16), lactate 1.97, temp 100.5 in ED, 101.6 rectally at SNF. CXR: mild bibasilar pulm infiltrates. Pt with PCN allergy, has gotten CTX in the past, has already been given 1 dose of zosyn in the ED today - appears to have tolerated dose.   Plan: Vancomycin 1 g IV given in ED then check a vancomycin random in 2 days and redose when level < or = 15 mcg/ml  Zosyn 3.375 g IV x 1 in ED, change to cefepime for HCAP coverage 500 mg IV q24 to start at 1600 today (aztreonam ordered but pt has tolerated cephalosporins in the past) Fluconazole 200 mg IV q24 F/u renal fxn, wbc, temp, culture data  Height: 5\' 3"  (160 cm) Weight: 139 lb (63 kg) IBW/kg (Calculated) : 52.4  Temp (24hrs), Avg:99.7 F (37.6 C), Min:98.9 F (37.2 C), Max:100.5 F (38.1 C)   Recent Labs Lab 07/21/2016 0705 07/21/2016 0729  WBC 20.1*  --   CREATININE 6.45*  --   LATICACIDVEN  --  1.97*    Estimated Creatinine Clearance: 7.8 mL/min (by C-G formula based on SCr of 6.45 mg/dL (H)).    Allergies  Allergen Reactions  . Codeine Nausea Only  . Metformin Diarrhea  . Penicillins Rash    Has patient had a PCN reaction causing immediate rash, facial/tongue/throat swelling, SOB or lightheadedness with hypotension:Yes Has patient had a PCN reaction causing severe rash involving mucus membranes or skin necrosis:unsure. Has patient had a PCN reaction that required hospitalization:No Has patient had a PCN reaction occurring within the last 10 years:No If all of the above answers are "NO", then may proceed with Cephalosporin use. Has patient had a PCN reaction causing immediate rash, faci    Alexis Sanders, Pharm.D. [phone] 15-Jul-2016  10:11 AM

## 2016-09-16 NOTE — ED Provider Notes (Signed)
[facility]-EMERGENCY DEPT Provider Note   CSN: [id number] Arrival date & time: Oct 07, 2016  0702     History   Chief Complaint Chief Complaint  Patient presents with  . Altered Mental Status    HPI Alexis Sanders is a 65 y.o. female.  Pt presents to the ED today with altered mental status.  She was admitted here from 10/31-11/17.  She was septic from PNA and UTI.  The pt was intubated during her stay.  The pt was LSN at 2300.  At shift change, the patient was noted to feel hot and have decreased mental status.  The patient's temp was 102 at the SNF and she was given tylenol suppository 650 mg.  The pt was given 800 cc of ns en route.  The pt is unable to give any history.      Past Medical History:  Diagnosis Date  . Diabetes mellitus without complication (HCC)   . Hypothyroidism   . Parkinson disease [facility])     Patient Active Problem List   Diagnosis Date Noted  . Sepsis (HCC) Oct 07, 2016  . FUO (fever of unknown origin)   . Dysphagia   . Palliative care encounter   . Goals of care, counseling/discussion   . Aspiration pneumonia of right lower lobe due to vomit (HCC)   . Acute respiratory failure with hypoxia (HCC) 08/20/2016  . SOB (shortness of breath)   . Acute encephalopathy 08/18/2016  . UTI (urinary tract infection) 08/18/2016  . ARF (acute renal failure) (HCC) 08/18/2016  . Hypothyroidism 08/18/2016  . Bipolar 1 disorder (HCC) 08/18/2016  . Parkinson's disease (HCC) 08/18/2016  . Diabetes mellitus type 2 in nonobese (HCC) 08/18/2016  . Acute renal failure superimposed on stage 3 chronic kidney disease (HCC) 08/18/2016  . Aspiration pneumonia (HCC) 08/18/2016  . Septic shock (HCC) 08/17/2016    Past Surgical History:  Procedure Laterality Date  . ABDOMINAL HYSTERECTOMY    . CHOLECYSTECTOMY    . TONSILLECTOMY      OB History    No data available       Home Medications    Prior to Admission medications   Medication Sig Start Date End Date Taking?  Authorizing Provider  amantadine (SYMMETREL) 100 MG capsule Take 100 mg by mouth 2 (two) times daily.    Historical Provider, MD  Amino Acids-Protein Hydrolys (FEEDING SUPPLEMENT, PRO-STAT SUGAR FREE 64,) LIQD Take 30 mLs by mouth 3 (three) times daily. 09/04/16   Filbert Schilder, MD  ARIPiprazole (ABILIFY) 10 MG tablet Take 10 mg by mouth every evening.    Historical Provider, MD  buPROPion (WELLBUTRIN) 100 MG tablet Take 100 mg by mouth daily.    Historical Provider, MD  clonazePAM (KLONOPIN) 1 MG tablet Take 1 mg by mouth 2 (two) times daily.    Historical Provider, MD  DULoxetine (CYMBALTA) 30 MG capsule Take 30 mg by mouth 2 (two) times daily.    Historical Provider, MD  feeding supplement, GLUCERNA SHAKE, (GLUCERNA SHAKE) LIQD Take 237 mLs by mouth 3 (three) times daily between meals. 09/04/16   Filbert Schilder, MD  gabapentin (NEURONTIN) 250 MG/5ML solution Take 4 mLs (200 mg total) by mouth 2 (two) times daily. 09/04/16   Filbert Schilder, MD  insulin glargine (LANTUS) 100 UNIT/ML injection Inject 0.12 mLs (12 Units total) into the skin at bedtime. 09/04/16   Filbert Schilder, MD  levothyroxine (SYNTHROID, LEVOTHROID) 125 MCG tablet Take 125 mcg by mouth daily before breakfast.    Historical  Provider, MD  montelukast (SINGULAIR) 10 MG tablet Take 10 mg by mouth at bedtime.    Historical Provider, MD  pantoprazole (PROTONIX) 40 MG tablet Take 40 mg by mouth every evening.    Historical Provider, MD  promethazine (PHENERGAN) 25 MG tablet Take 25 mg by mouth every 6 (six) hours as needed for nausea or vomiting.    Historical Provider, MD  solifenacin (VESICARE) 5 MG tablet Take 5 mg by mouth daily.    Historical Provider, MD    Family History Family History  Problem Relation Age of Onset  . Parkinson's disease Neg Hx     Social History Social History  Substance Use Topics  . Smoking status: Former Games developermoker  . Smokeless tobacco: Never Used  . Alcohol use No      Allergies   Codeine; Metformin; and Penicillins   Review of Systems Review of Systems  Unable to perform ROS: Mental status change     Physical Exam Updated Vital Signs BP 117/72 (BP Location: Right Arm)   Pulse 93   Temp 98.9 F (37.2 C) (Rectal)   Resp 20   Ht 5\' 3"  (1.6 m)   Wt 139 lb (63 kg)   SpO2 100%   BMI 24.62 kg/m   Physical Exam  Constitutional: She appears well-developed and well-nourished. She appears lethargic.  HENT:  Head: Normocephalic and atraumatic.  Right Ear: External ear normal.  Left Ear: External ear normal.  Nose: Nose normal.  Mouth/Throat: Mucous membranes are dry.  Eyes: Conjunctivae and EOM are normal. Pupils are equal, round, and reactive to light.  Neck: Normal range of motion. Neck supple.  Cardiovascular: Regular rhythm, normal heart sounds and intact distal pulses.  Tachycardia present.   Pulmonary/Chest: Effort normal and breath sounds normal.  Abdominal: Soft. Bowel sounds are normal.  Musculoskeletal: Normal range of motion.  Neurological: She appears lethargic. She displays tremor.  Pt is not talking right now, but she is following commands.  Skin: Skin is dry. Capillary refill takes 2 to 3 seconds.  Skin tenting  Nursing note and vitals reviewed.    ED Treatments / Results  Labs (all labs ordered are listed, but only abnormal results are displayed) Labs Reviewed  COMPREHENSIVE METABOLIC PANEL - Abnormal; Notable for the following:       Result Value   Sodium 164 (*)    Chloride 128 (*)    CO2 19 (*)    Glucose, Bld 209 (*)    BUN 135 (*)    Creatinine, Ser 6.45 (*)    AST 74 (*)    ALT 98 (*)    Alkaline Phosphatase 169 (*)    GFR calc non Af Amer 6 (*)    GFR calc Af Amer 7 (*)    Anion gap 17 (*)    All other components within normal limits  CBC WITH DIFFERENTIAL/PLATELET - Abnormal; Notable for the following:    WBC 20.1 (*)    RDW 18.0 (*)    Platelets 145 (*)    Neutro Abs 17.3 (*)    All other  components within normal limits  URINALYSIS, ROUTINE W REFLEX MICROSCOPIC (NOT AT [facility]) - Abnormal; Notable for the following:    Color, Urine AMBER (*)    APPearance TURBID (*)    Hgb urine dipstick MODERATE (*)    Bilirubin Urine SMALL (*)    Ketones, ur 15 (*)    Protein, ur 100 (*)    Leukocytes, UA LARGE (*)  All other components within normal limits  TROPONIN I - Abnormal; Notable for the following:    Troponin I 0.05 (*)    All other components within normal limits  PROTIME-INR - Abnormal; Notable for the following:    Prothrombin Time 17.0 (*)    All other components within normal limits  URINE MICROSCOPIC-ADD ON - Abnormal; Notable for the following:    Squamous Epithelial / LPF 0-5 (*)    Bacteria, UA MANY (*)    All other components within normal limits  I-STAT CG4 LACTIC ACID, ED - Abnormal; Notable for the following:    Lactic Acid, Venous 1.97 (*)    All other components within normal limits  CULTURE, BLOOD (ROUTINE X 2)  CULTURE, BLOOD (ROUTINE X 2)  URINE CULTURE  BLOOD GAS, ARTERIAL  MAGNESIUM  PHOSPHORUS  TROPONIN I  TROPONIN I  UREA NITROGEN, URINE  CREATININE, URINE, RANDOM    EKG  EKG Interpretation  Date/Time:  Wednesday September 16 2016 07:45:18 EST Ventricular Rate:  99 PR Interval:    QRS Duration: 102 QT Interval:  393 QTC Calculation: 505 R Axis:   -20 Text Interpretation:  Sinus rhythm Ventricular premature complex Aberrant conduction of SV complex(es) Probable LVH with secondary repol abnrm Inferior infarct, old Anterior Q waves, possibly due to LVH Prolonged QT interval No significant change since last tracing Confirmed by [facility] MD, Daniela Siebers ([id number]) on 08/27/2016 7:48:14 AM Also confirmed by [facility] MD, Zannah Melucci ([id number]), editor [place], Cala Bradford [phone])  on 08/19/2016 8:02:15 AM       Radiology Dg Chest Port 1 View  Result Date: 08/25/2016 CLINICAL DATA:  Shaking.  Fever. EXAM: PORTABLE CHEST 1 VIEW COMPARISON:  CT 08/27/2016.  FINDINGS: Mediastinum and hilar structures are normal. Low lung volumes. Mild bibasilar pulmonary infiltrates. Heart size stable. Pulmonary vascularity normal . No pleural effusion or pneumothorax . IMPRESSION: Low lung volumes.  Mild bibasilar pulmonary infiltrates. Electronically Signed   By: Maisie Fus  Register   On: 08/24/2016 07:44    Procedures .Critical Care Performed by: Jacalyn Lefevre Authorized by: Jacalyn Lefevre   Critical care provider statement:    Critical care time (minutes):  30   Critical care time was exclusive of:  Separately billable procedures and treating other patients and teaching time   Critical care was necessary to treat or prevent imminent or life-threatening deterioration of the following conditions:  Dehydration and sepsis   Critical care was time spent personally by me on the following activities:  Discussions with consultants, evaluation of patient's response to treatment, examination of patient, ordering and performing treatments and interventions, ordering and review of laboratory studies, ordering and review of radiographic studies, pulse oximetry, re-evaluation of patient's condition and review of old charts     (including critical care time)  Medications Ordered in ED Medications  vancomycin (VANCOCIN) IVPB 750 mg/150 ml premix (not administered)  piperacillin-tazobactam (ZOSYN) IVPB 3.375 g (0 g Intravenous Stopped 09/12/2016 0809)  fluconazole (DIFLUCAN) tablet 150 mg (not administered)  sodium chloride 0.9 % bolus 1,000 mL (0 mLs Intravenous Stopped 08/29/2016 0809)    And  sodium chloride 0.9 % bolus 1,000 mL (0 mLs Intravenous Stopped 08/28/2016 0904)  aspirin suppository 300 mg (300 mg Rectal Given 08/20/2016 0736)  vancomycin (VANCOCIN) IVPB 1000 mg/200 mL premix (0 mg Intravenous Stopped 09/01/2016 0834)  sodium chloride 0.9 % bolus 1,000 mL (0 mLs Intravenous Stopped 08/25/2016 0915)  sodium chloride 0.9 % bolus 1,000 mL (1,000 mLs Intravenous New  Bag/Given 09/15/2016 0916)  Initial Impression / Assessment and Plan / ED Course  I have reviewed the triage vital signs and the nursing notes.  Pertinent labs & imaging results that were available during my care of the patient were reviewed by me and considered in my medical decision making (see chart for details).  Clinical Course    This patient meets SIRS Criteria and may be septic. SIRS = Systemic Inflammatory Response Syndrome  Best Practice Recommends:   Notify the nurse immediately to increase monitoring of patient.   The recent clinical data is shown below. Vitals:   08-19-16 0845 08-19-16 0900 08-19-16 0915 08-19-16 0916  BP: 93/74 103/68  117/72  Pulse: 92 93  93  Resp: 23 20    Temp:   98.9 F (37.2 C)   TempSrc:   Rectal   SpO2: 100% 99%  100%  Weight:      Height:        Pt receiving IVFs.  She is extremely dehydrated as well.  Pt is more awake.  She will look at you and is trying to talk.  BP trending upwards.  Pt  d/w hospitalists (Dr. Melynda RippleHobbs) for admission.  Final Clinical Impressions(s) / ED Diagnoses   Final diagnoses:  Sepsis, due to unspecified organism (HCC)  Healthcare-associated pneumonia  Acute cystitis without hematuria  Candidiasis  Hypernatremia  Acute renal failure, unspecified acute renal failure type (HCC)  Dehydration    New Prescriptions New Prescriptions   No medications on file     Jacalyn LefevreJulie Lynlee Stratton, MD 09/17/16 0730

## 2016-09-16 NOTE — Progress Notes (Signed)
Pharmacy Antibiotic Note  Alexis Sanders is a 65 y.o. female admitted from [facility] on December 23, 2015 with fevers, possible sepsis.  Pharmacy has been consulted for Vancomycin and Zosyn  dosing.  Plan: Vancomycin 1 g IV now, then 750 mg IV q24h Zosyn 3.375 g IV q8h   Height: 5\' 3"  (160 cm) Weight: 139 lb (63 kg) IBW/kg (Calculated) : 52.4  No data recorded.  No results for input(s): WBC, CREATININE, LATICACIDVEN, VANCOTROUGH, VANCOPEAK, VANCORANDOM, GENTTROUGH, GENTPEAK, GENTRANDOM, TOBRATROUGH, TOBRAPEAK, TOBRARND, AMIKACINPEAK, AMIKACINTROU, AMIKACIN in the last 168 hours.  Estimated Creatinine Clearance: 34.9 mL/min (by C-G formula based on SCr of 1.44 mg/dL (H)).    Allergies  Allergen Reactions  . Codeine Nausea Only  . Metformin Diarrhea  . Penicillins Rash    Has patient had a PCN reaction causing immediate rash, facial/tongue/throat swelling, SOB or lightheadedness with hypotension:Yes Has patient had a PCN reaction causing severe rash involving mucus membranes or skin necrosis:unsure. Has patient had a PCN reaction that required hospitalization:No Has patient had a PCN reaction occurring within the last 10 years:No If all of the above answers are "NO", then may proceed with Cephalosporin use. Has patient had a PCN reaction causing immediate rash, faci     Alexis Sanders, Alexis Sanders December 23, 2015 7:09 AM

## 2016-09-16 NOTE — ED Notes (Signed)
Emptied pt's Foley catheter urinary bag.  of urine output.

## 2016-09-16 NOTE — H&P (Addendum)
[facility] History and Physical  Alexis Sanders ZOX:[id number] DOB: December 16, 1950 DOA: 09/24/16  Referring physician:  PCP: Quentin Mulling, PA-C   Chief Complaint: "She hasn't been herself."  HPI: Alexis Sanders is a 65 y.o. female passable history significant for diabetes, low thyroid, Parkinson's, bipolar presents emergency room with chief complaint of altered mental status per her nursing facility. Patient was recently discharged from [facility] diagnosis of pneumonia. Patient has been skilled rehabilitation. Patient's husband states that she was recently spaced restarted on Klonopin, not completely certain. Feels that patient began to have decreased alertness after this about 2 days after discharge. He states after gradual decrease patient did not worsen. He questioned nursing home staff about this but never  Got an answer. The day of admission patient was sent to emergency room for evaluation for being less alert.   ED course: Started on sepsis protocol. Given think Zosyn. Blood cultures drawn. Urine sent for culture. Hospitalist was order flumazenil and patient responded well.  Review of Systems:  As per HPI otherwise 10 point review of systems negative.    Past Medical History:  Diagnosis Date  . Bipolar 1 disorder (HCC)   . Diabetes mellitus without complication (HCC)   . Hypothyroidism   . Parkinson disease [facility])    Past Surgical History:  Procedure Laterality Date  . ABDOMINAL HYSTERECTOMY    . CHOLECYSTECTOMY    . TONSILLECTOMY     Social History:  reports that she has quit smoking. She has never used smokeless tobacco. She reports that she does not drink alcohol or use drugs.  Allergies  Allergen Reactions  . Codeine Nausea Only  . Metformin Diarrhea  . Penicillins Rash    Has patient had a PCN reaction causing immediate rash, facial/tongue/throat swelling, SOB or lightheadedness with hypotension:Yes Has patient had a PCN reaction causing severe rash  involving mucus membranes or skin necrosis:unsure. Has patient had a PCN reaction that required hospitalization:No Has patient had a PCN reaction occurring within the last 10 years:No If all of the above answers are "NO", then may proceed with Cephalosporin use. Has patient had a PCN reaction causing immediate rash, faci    Family History  Problem Relation Age of Onset  . Parkinson's disease Neg Hx      Prior to Admission medications   Medication Sig Start Date End Date Taking? Authorizing Provider  amantadine (SYMMETREL) 100 MG capsule Take 100 mg by mouth 2 (two) times daily.   Yes Historical Provider, MD  Amino Acids-Protein Hydrolys (FEEDING SUPPLEMENT, PRO-STAT SUGAR FREE 64,) LIQD Take 30 mLs by mouth 3 (three) times daily. 09/04/16  Yes Filbert Schilder, MD  ARIPiprazole (ABILIFY) 10 MG tablet Take 10 mg by mouth every evening.   Yes Historical Provider, MD  buPROPion (WELLBUTRIN) 100 MG tablet Take 100 mg by mouth daily.   Yes Historical Provider, MD  clonazePAM (KLONOPIN) 1 MG tablet Take 1 mg by mouth 2 (two) times daily.   Yes Historical Provider, MD  DULoxetine (CYMBALTA) 30 MG capsule Take 30 mg by mouth 2 (two) times daily.   Yes Historical Provider, MD  feeding supplement, GLUCERNA SHAKE, (GLUCERNA SHAKE) LIQD Take 237 mLs by mouth 3 (three) times daily between meals. 09/04/16  Yes Filbert Schilder, MD  gabapentin (NEURONTIN) 250 MG/5ML solution Take 4 mLs (200 mg total) by mouth 2 (two) times daily. 09/04/16  Yes Filbert Schilder, MD  insulin aspart (NOVOLOG FLEXPEN) 100 UNIT/ML FlexPen Inject 0-15 Units into the skin  See admin instructions. Bs<150=0 units, 151-200=4 units, 201-250=6 units, 251-300=8 units, 301-350=10 units, 351-400=12 units, >400=15 units and notify MD   Yes Historical Provider, MD  insulin glargine (LANTUS) 100 UNIT/ML injection Inject 0.12 mLs (12 Units total) into the skin at bedtime. 09/04/16  Yes Filbert SchilderAlexandria U Kadolph, MD  levothyroxine  (SYNTHROID, LEVOTHROID) 125 MCG tablet Take 125 mcg by mouth daily before breakfast.   Yes Historical Provider, MD  montelukast (SINGULAIR) 10 MG tablet Take 10 mg by mouth at bedtime.   Yes Historical Provider, MD  pantoprazole (PROTONIX) 40 MG tablet Take 40 mg by mouth every evening.   Yes Historical Provider, MD  promethazine (PHENERGAN) 25 MG tablet Take 25 mg by mouth every 6 (six) hours as needed for nausea or vomiting.   Yes Historical Provider, MD  solifenacin (VESICARE) 5 MG tablet Take 5 mg by mouth daily.   Yes Historical Provider, MD   Physical Exam: Vitals:   2016-02-17 0915 2016-02-17 0915 2016-02-17 0916 2016-02-17 1000  BP: 117/72  117/72 131/83  Pulse: 92  93 95  Resp: 23   25  Temp:  98.9 F (37.2 C)    TempSrc:  Rectal    SpO2: 100%  100% 99%  Weight:      Height:        Wt Readings from Last 3 Encounters:  2016-02-17 63 kg (139 lb)  09/03/16 63.4 kg (139 lb 12.4 oz)    General:  Appears calm and comfortable. Severe dehydration. Eyes:  PERRL, EOMI, normal lids, iris ENT:  grossly normal hearing, lips & tongue Neck:  no LAD, masses or thyromegaly Cardiovascular:  RRR, no m/r/g. No LE edema. 1+ pulses. Respiratory:  CTA bilaterally, no w/r/r. Normal respiratory effort. Abdomen:  soft, ntnd Skin:  no rash or induration seen on limited exam. Poor skin turgor. Musculoskeletal:  grossly normal tone BUE/BLE Psychiatric:  grossly normal mood and affect, speech fluent and appropriate Neurologic:  CN 2-12 grossly intact, moves all extremities in coordinated fashion. GCS 11 before flumazenil. GCS 15 after.          Labs on Admission:  Basic Metabolic Panel:  Recent Labs Lab 2016-02-17 0705  NA 164*  K 5.0  CL 128*  CO2 19*  GLUCOSE 209*  BUN 135*  CREATININE 6.45*  CALCIUM 9.2   Liver Function Tests:  Recent Labs Lab 2016-02-17 0705  AST 74*  ALT 98*  ALKPHOS 169*  BILITOT 0.7  PROT 7.0  ALBUMIN 3.6   No results for input(s): LIPASE, AMYLASE in the last  168 hours. No results for input(s): AMMONIA in the last 168 hours. CBC:  Recent Labs Lab 2016-02-17 0705  WBC 20.1*  NEUTROABS 17.3*  HGB 13.6  HCT 44.6  MCV 90.7  PLT 145*   Cardiac Enzymes:  Recent Labs Lab 2016-02-17 0705  TROPONINI 0.05*    BNP (last 3 results) No results for input(s): BNP in the last 8760 hours.  ProBNP (last 3 results) No results for input(s): PROBNP in the last 8760 hours.   Serum creatinine: 6.45 mg/dL High 40/98/112017-05-01 [id number] Estimated creatinine clearance: 7.8 mL/min  CBG: No results for input(s): GLUCAP in the last 168 hours.  Radiological Exams on Admission: Dg Chest Port 1 View  Result Date: 11-06-15 CLINICAL DATA:  Shaking.  Fever. EXAM: PORTABLE CHEST 1 VIEW COMPARISON:  CT 08/27/2016. FINDINGS: Mediastinum and hilar structures are normal. Low lung volumes. Mild bibasilar pulmonary infiltrates. Heart size stable. Pulmonary vascularity normal . No pleural effusion or pneumothorax .  IMPRESSION: Low lung volumes.  Mild bibasilar pulmonary infiltrates. Electronically Signed   By: Maisie Fushomas  Register   On: 08/20/2016 07:44    EKG: Independently reviewed. VR 99, QRS 102, QTc 505, NSR, no STEMI; no acute changes when compared to 07/2016  Assessment/Plan Active Problems:   Hypothyroidism   Bipolar 1 disorder (HCC)   Parkinson's disease (HCC)   Diabetes mellitus type 2 in nonobese (HCC)   Acute renal failure superimposed on stage 3 chronic kidney disease (HCC)   Sepsis (HCC)  Sepsis 2/2 UTI Patient hemodynamically stable Given 4L emergency room, will continue Given Vanc and zosyn in the emergency room, will continue vanc and switch zosyn to maxipime Urine culture pending Blood cultures 2 pending  Patient given 4 mL of fluid in the emergency room Lactic acid elevated, will trend  Acidemia secondary to metabolic acidosis Treating sepsis Resolved on rechecj  AKI Baseline Cr 1.4, Cr on admit 6.45 4L of normal saline given in the emergency  room Gentle hydration overnight Checking magnesium and phosphorus Urine labs ordered to calculate fractional excretion of urea Nephro consulted  Troponemia Will trend EKG in AM Likely from low BP/sepsis  Candiuria When more stable, consider speculum exam Renally dosed diflucan (pharmacy confirmed), 14 day course  Hypernatremia Most recent weight in system from 11/16 139, we'll admit 139 Free water deficit 5.4 L [facility] on exam on arrival per ED provider We'll continue hydration Serial sodiums q4 Still replacing extracellular volume Started on D5W in ED  Hypertension urgency When necessary hydralazine 10 mg IV as needed for severe blood pressure Checking head CT Occurred after fluid resuscitation, slowly improving  Altered mental status Likely multifactorial Patient with sepsis and hyponatremia Likely sedation from Klonopin also a factor Patient responded to Flumazenill in the ED CT head ordered Avoid sedating meds  Bipolar Hold wellbutrin, abilify, klonopin, cymbalta  DM  SSI Holding lantus, nl dose 12u  Bladder issue Cont vesicare-->enablex  PD Hold  amantadine  Hypothyroidism Cont OP synthroid 125 mcg qd No signs of hyper or hypothyroidism  GERD Hold OP protonix 40mg  qd   Chronic pain Hold gabapentin  Code Status: DNR  DVT Prophylaxis: Heparin Family Communication: Husband at bedside Disposition Plan: Pending Improvement  CRITICAL CARE Performed by: Haydee SalterPhillip M Hobbs   Total critical care time: 60 minutes  Critical care time was exclusive of separately billable procedures and treating other patients.  Critical care was necessary to treat or prevent imminent or life-threatening deterioration.  Critical care was time spent personally by me on the following activities: development of treatment plan with patient and/or surrogate as well as nursing, discussions with consultants, evaluation of patient's response to treatment, examination of patient,  obtaining history from patient or surrogate, ordering and performing treatments and interventions, ordering and review of laboratory studies, ordering and review of radiographic studies, pulse oximetry and re-evaluation of patient's condition.   Haydee SalterPhillip M Hobbs, MD Family Medicine [facility] [email or web address] Password [email or web address]

## 2016-09-16 NOTE — ED Notes (Signed)
Dr. Particia NearingHaviland made aware of BP 83/73. IV boluses infusing. Will continue to monitor.

## 2016-09-16 NOTE — ED Notes (Signed)
Pt soiled themselves and pericare performed.

## 2016-09-16 NOTE — Consult Note (Signed)
Reason for Consult:AKI, hypernatremia Referring Physician: Dr. Gayla Doss is an 65 y.o. female.  HPI: 65 yr female with hx DM, Parkinsons, Hypothyroid, and Bipolar Dz. Brought to ED for decreased level of responsiveness.  Hosp about 2 wk ago for ? Pneu, UTI.  Since post d/c husband noted progressive lower LOC (unclear what baseline is) at her nursing facility.  He apparently thought was related to recent use of Klonopin.    Found to have Cr of 6.45/BUN 135 and SNa of 164. Bicarb of 20.    Tx for ? Sepsis.  Baseline Cr 1.3-1.4.   Review of systems not obtained due to patient factors. .  Past Medical History:  Diagnosis Date  . Bipolar 1 disorder ([place])   . Diabetes mellitus without complication ([place])   . Hypothyroidism   . Parkinson disease [facility])     Past Surgical History:  Procedure Laterality Date  . ABDOMINAL HYSTERECTOMY    . CHOLECYSTECTOMY    . TONSILLECTOMY      Family History  Problem Relation Age of Onset  . Parkinson's disease Neg Hx     Social History:  reports that she has quit smoking. She has never used smokeless tobacco. She reports that she does not drink alcohol or use drugs.  Allergies:  Allergies  Allergen Reactions  . Codeine Nausea Only  . Metformin Diarrhea  . Penicillins Rash    Has patient had a PCN reaction causing immediate rash, facial/tongue/throat swelling, SOB or lightheadedness with hypotension:Yes Has patient had a PCN reaction causing severe rash involving mucus membranes or skin necrosis:unsure. Has patient had a PCN reaction that required hospitalization:No Has patient had a PCN reaction occurring within the last 10 years:No If all of the above answers are "NO", then may proceed with Cephalosporin use. Has patient had a PCN reaction causing immediate rash, faci    Medications: I have reviewed the patient's current medications. Prior to Admission:  (Not in a hospital admission) .  Results for orders placed or performed  during the hospital encounter of 08/22/2016 (from the past 48 hour(s))  Comprehensive metabolic panel     Status: Abnormal   Collection Time: 09/09/2016  7:05 AM  Result Value Ref Range   Sodium 164 (HH) 135 - 145 mmol/L    Comment: CRITICAL RESULT CALLED TO, READ BACK BY AND VERIFIED WITH: E.HOWELL,RN 5573 09/17/2016 CLARK,S    Potassium 5.0 3.5 - 5.1 mmol/L   Chloride 128 (H) 101 - 111 mmol/L   CO2 19 (L) 22 - 32 mmol/L   Glucose, Bld 209 (H) 65 - 99 mg/dL   BUN 135 (H) 6 - 20 mg/dL   Creatinine, Ser 6.45 (H) 0.44 - 1.00 mg/dL   Calcium 9.2 8.9 - 10.3 mg/dL   Total Protein 7.0 6.5 - 8.1 g/dL   Albumin 3.6 3.5 - 5.0 g/dL   AST 74 (H) 15 - 41 U/L   ALT 98 (H) 14 - 54 U/L   Alkaline Phosphatase 169 (H) 38 - 126 U/L   Total Bilirubin 0.7 0.3 - 1.2 mg/dL   GFR calc non Af Amer 6 (L) >60 mL/min   GFR calc Af Amer 7 (L) >60 mL/min    Comment: (NOTE) The eGFR has been calculated using the CKD EPI equation. This calculation has not been validated in all clinical situations. eGFR's persistently <60 mL/min signify possible Chronic Kidney Disease.    Anion gap 17 (H) 5 - 15  CBC WITH DIFFERENTIAL  Status: Abnormal   Collection Time: 08/22/2016  7:05 AM  Result Value Ref Range   WBC 20.1 (H) 4.0 - 10.5 K/uL   RBC 4.92 3.87 - 5.11 MIL/uL   Hemoglobin 13.6 12.0 - 15.0 g/dL   HCT 44.6 36.0 - 46.0 %   MCV 90.7 78.0 - 100.0 fL   MCH 27.6 26.0 - 34.0 pg   MCHC 30.5 30.0 - 36.0 g/dL   RDW 18.0 (H) 11.5 - 15.5 %   Platelets 145 (L) 150 - 400 K/uL    Comment: PLATELET COUNT CONFIRMED BY SMEAR   Neutrophils Relative % 86 %   Lymphocytes Relative 10 %   Monocytes Relative 4 %   Eosinophils Relative 0 %   Basophils Relative 0 %   Neutro Abs 17.3 (H) 1.7 - 7.7 K/uL   Lymphs Abs 2.0 0.7 - 4.0 K/uL   Monocytes Absolute 0.8 0.1 - 1.0 K/uL   Eosinophils Absolute 0.0 0.0 - 0.7 K/uL   Basophils Absolute 0.0 0.0 - 0.1 K/uL   Smear Review MORPHOLOGY UNREMARKABLE   Troponin I     Status: Abnormal    Collection Time: 08/25/2016  7:05 AM  Result Value Ref Range   Troponin I 0.05 (HH) <0.03 ng/mL    Comment: CRITICAL RESULT CALLED TO, READ BACK BY AND VERIFIED WITH: E.HOWELL,RN 5573 09/14/2016 CLARK,S   Protime-INR     Status: Abnormal   Collection Time: 09/04/2016  7:05 AM  Result Value Ref Range   Prothrombin Time 17.0 (H) 11.4 - 15.2 seconds   INR 1.37   Blood Culture (routine x 2)     Status: None (Preliminary result)   Collection Time: 09/09/2016  7:15 AM  Result Value Ref Range   Specimen Description BLOOD RIGHT HAND    Special Requests BOTTLES DRAWN AEROBIC AND ANAEROBIC  5CC    Culture PENDING    Report Status PENDING   I-Stat CG4 Lactic Acid, ED  (not at  [facility])     Status: Abnormal   Collection Time: 08/27/2016  7:29 AM  Result Value Ref Range   Lactic Acid, Venous 1.97 (HH) 0.5 - 1.9 mmol/L  Urinalysis, Routine w reflex microscopic (not at [facility])     Status: Abnormal   Collection Time: 08/22/2016  7:42 AM  Result Value Ref Range   Color, Urine AMBER (A) YELLOW    Comment: BIOCHEMICALS MAY BE AFFECTED BY COLOR   APPearance TURBID (A) CLEAR   Specific Gravity, Urine 1.019 1.005 - 1.030   pH 5.0 5.0 - 8.0   Glucose, UA NEGATIVE NEGATIVE mg/dL   Hgb urine dipstick MODERATE (A) NEGATIVE   Bilirubin Urine SMALL (A) NEGATIVE   Ketones, ur 15 (A) NEGATIVE mg/dL   Protein, ur 100 (A) NEGATIVE mg/dL   Nitrite NEGATIVE NEGATIVE   Leukocytes, UA LARGE (A) NEGATIVE  Urine microscopic-add on     Status: Abnormal   Collection Time: 08/26/2016  7:42 AM  Result Value Ref Range   Squamous Epithelial / LPF 0-5 (A) NONE SEEN   WBC, UA TOO NUMEROUS TO COUNT 0 - 5 WBC/hpf   RBC / HPF 0-5 0 - 5 RBC/hpf   Bacteria, UA MANY (A) NONE SEEN   Urine-Other YEAST PRESENT   Creatinine, urine, random     Status: None   Collection Time: 08/29/2016  9:26 AM  Result Value Ref Range   Creatinine, Urine 111.26 mg/dL  I-Stat arterial blood gas, ED ([facility])     Status: Abnormal   Collection  Time: 08/25/2016   9:37 AM  Result Value Ref Range   pH, Arterial 7.288 (L) 7.350 - 7.450   pCO2 arterial 31.8 (L) 32.0 - 48.0 mmHg   pO2, Arterial 92.0 83.0 - 108.0 mmHg   Bicarbonate 15.2 (L) 20.0 - 28.0 mmol/L   TCO2 16 0 - 100 mmol/L   O2 Saturation 96.0 %   Acid-base deficit 10.0 (H) 0.0 - 2.0 mmol/L   Patient temperature 98.9 F    Collection site BRACHIAL ARTERY    Drawn by Operator    Sample type ARTERIAL   I-Stat CG4 Lactic Acid, ED  (not at  [facility])     Status: None   Collection Time: 09/05/2016  9:58 AM  Result Value Ref Range   Lactic Acid, Venous 1.47 0.5 - 1.9 mmol/L  Magnesium     Status: Abnormal   Collection Time: 09/06/2016 10:43 AM  Result Value Ref Range   Magnesium 2.8 (H) 1.7 - 2.4 mg/dL  Phosphorus     Status: Abnormal   Collection Time: 08/23/2016 10:43 AM  Result Value Ref Range   Phosphorus 5.0 (H) 2.5 - 4.6 mg/dL  Troponin I     Status: Abnormal   Collection Time: 09/14/2016 10:43 AM  Result Value Ref Range   Troponin I 0.05 (HH) <0.03 ng/mL    Comment: CRITICAL VALUE NOTED.  VALUE IS CONSISTENT WITH PREVIOUSLY REPORTED AND CALLED VALUE.  Lactic acid, plasma     Status: None   Collection Time: 08/31/2016 10:43 AM  Result Value Ref Range   Lactic Acid, Venous 1.9 0.5 - 1.9 mmol/L  Basic metabolic panel     Status: Abnormal   Collection Time: 09/12/2016 10:43 AM  Result Value Ref Range   Sodium 161 (HH) 135 - 145 mmol/L    Comment: CRITICAL RESULT CALLED TO, READ BACK BY AND VERIFIED WITH: S.[facility] 1158 09/04/2016 CLARK,S    Potassium 4.8 3.5 - 5.1 mmol/L   Chloride >130 (HH) 101 - 111 mmol/L    Comment: CRITICAL RESULT CALLED TO, READ BACK BY AND VERIFIED WITH: S.FLACK,RN 1158 09/01/2016 CLARK,S    CO2 20 (L) 22 - 32 mmol/L   Glucose, Bld 210 (H) 65 - 99 mg/dL   BUN 114 (H) 6 - 20 mg/dL   Creatinine, Ser 5.17 (H) 0.44 - 1.00 mg/dL   Calcium 7.5 (L) 8.9 - 10.3 mg/dL   GFR calc non Af Amer 8 (L) >60 mL/min   GFR calc Af Amer 9 (L) >60 mL/min    Comment: (NOTE) The eGFR has  been calculated using the CKD EPI equation. This calculation has not been validated in all clinical situations. eGFR's persistently <60 mL/min signify possible Chronic Kidney Disease.    Anion gap NOT CALCULATED 5 - 15  CBG monitoring, ED     Status: Abnormal   Collection Time: 08/24/2016 12:00 PM  Result Value Ref Range   Glucose-Capillary 223 (H) 65 - 99 mg/dL   Comment 1 Notify RN    Comment 2 Document in Chart   I-Stat venous blood gas, ED     Status: Abnormal   Collection Time: 09/01/2016  1:15 PM  Result Value Ref Range   pH, Ven 7.294 7.250 - 7.430   pCO2, Ven 31.8 (L) 44.0 - 60.0 mmHg   pO2, Ven 42.0 32.0 - 45.0 mmHg   Bicarbonate 15.4 (L) 20.0 - 28.0 mmol/L   TCO2 16 0 - 100 mmol/L   O2 Saturation 73.0 %   Acid-base deficit 10.0 (H)  0.0 - 2.0 mmol/L   Patient temperature HIDE    Sample type VENOUS   CBG monitoring, ED     Status: Abnormal   Collection Time: 08/26/2016  3:56 PM  Result Value Ref Range   Glucose-Capillary 197 (H) 65 - 99 mg/dL    Ct Head Wo Contrast  Result Date: 09/05/2016 CLINICAL DATA:  Acute encephalopathy. EXAM: CT HEAD WITHOUT CONTRAST TECHNIQUE: Contiguous axial images were obtained from the base of the skull through the vertex without intravenous contrast. COMPARISON:  CT scan of August 18, 2016. FINDINGS: Brain: Mild diffuse cortical atrophy is noted. No mass effect or midline shift is noted. Ventricular size is within normal limits. There is no evidence of mass lesion, hemorrhage or acute infarction. Vascular: Atherosclerosis of carotid siphons is noted. Skull: Normal. Negative for fracture or focal lesion. Sinuses/Orbits: No acute finding. Other: None. IMPRESSION: Mild diffuse cortical atrophy. No acute intracranial abnormality seen. Electronically Signed   By: Marijo Conception, M.D.   On: 09/03/2016 12:47   Dg Chest Port 1 View  Result Date: 08/19/2016 CLINICAL DATA:  Shaking.  Fever. EXAM: PORTABLE CHEST 1 VIEW COMPARISON:  CT 08/27/2016. FINDINGS:  Mediastinum and hilar structures are normal. Low lung volumes. Mild bibasilar pulmonary infiltrates. Heart size stable. Pulmonary vascularity normal . No pleural effusion or pneumothorax . IMPRESSION: Low lung volumes.  Mild bibasilar pulmonary infiltrates. Electronically Signed   By: Marcello Moores  Register   On: 09/03/2016 07:44    ROS Blood pressure 112/70, pulse 88, temperature 97.9 F (36.6 C), temperature source Rectal, resp. rate 16, height '5\' 3"'  (1.6 m), weight 63 kg (139 lb), SpO2 100 %. Physical Exam Physical Examination: General appearance - pale tremulous Mental status - opens eyes, unintelligible speech,  Pill rolling in hands Eyes - pupils equal and reactive, extraocular eye movements intact, funduscopic exam normal, discs flat and sharp Mouth - no teeth, severely dry Neck - adenopathy noted PCL Lymphatics - posterior cervical nodes Chest - decreased air entry noted bilat, few rhonchi bibasilar Heart - S1 and S2 normal Abdomen - soft, pos bs, liver down 5 cm,  Extremities - no pedal edema noted, intact peripheral pulses Skin - pale, bruises  Assessment/Plan: 1 AKI  Appears to be all vol depletion, SNa ^^, c/w low vol and esp H20 losses.  ? If past hx Li use.  WBC in urine with yeast now , but cultures neg in past, ??AIN which is assoc with conc defects.  Suspect this is all hemodynamic and vol depletion , AKI and hyperOSM state contrib to AMS. Need vol and water, with slow correction NOT RAPID.  Will use 1/2 NS 2 Poss sepsis on AB and antifungal 3 Hypertension: not an issue 4. Bipolar 5. Parkinsons  ?? Drug effects.   6 DM  7 Debill 8 Hypothyroid P cultures, 1/2 NS,  U/S, limit meds, follow SNa,   Bleu Minerd L 09/13/2016, 4:52 PM

## 2016-09-16 NOTE — ED Notes (Signed)
Pt CGB was 223 notified Sarah Banker(RN)

## 2016-09-17 ENCOUNTER — Inpatient Hospital Stay (HOSPITAL_COMMUNITY): Payer: Medicare Other

## 2016-09-17 DIAGNOSIS — A419 Sepsis, unspecified organism: Secondary | ICD-10-CM

## 2016-09-17 DIAGNOSIS — F319 Bipolar disorder, unspecified: Secondary | ICD-10-CM

## 2016-09-17 DIAGNOSIS — E119 Type 2 diabetes mellitus without complications: Secondary | ICD-10-CM

## 2016-09-17 DIAGNOSIS — N183 Chronic kidney disease, stage 3 (moderate): Secondary | ICD-10-CM

## 2016-09-17 DIAGNOSIS — G9340 Encephalopathy, unspecified: Secondary | ICD-10-CM

## 2016-09-17 DIAGNOSIS — N390 Urinary tract infection, site not specified: Secondary | ICD-10-CM

## 2016-09-17 LAB — BASIC METABOLIC PANEL
BUN: 104 mg/dL — AB (ref 6–20)
BUN: 90 mg/dL — AB (ref 6–20)
CO2: 17 mmol/L — ABNORMAL LOW (ref 22–32)
CO2: 18 mmol/L — AB (ref 22–32)
CREATININE: 5.04 mg/dL — AB (ref 0.44–1.00)
Calcium: 8.2 mg/dL — ABNORMAL LOW (ref 8.9–10.3)
Calcium: 8.9 mg/dL (ref 8.9–10.3)
Creatinine, Ser: 4.43 mg/dL — ABNORMAL HIGH (ref 0.44–1.00)
GFR calc Af Amer: 9 mL/min — ABNORMAL LOW (ref >60)
GFR calc Af Amer: 11 mL/min — ABNORMAL LOW (ref >60)
GFR calc non Af Amer: 8 mL/min — ABNORMAL LOW (ref >60)
GFR, EST NON AFRICAN AMERICAN: 10 mL/min — AB (ref >60)
GLUCOSE: 110 mg/dL — AB (ref 65–99)
Glucose, Bld: 128 mg/dL — ABNORMAL HIGH (ref 65–99)
POTASSIUM: 4.4 mmol/L (ref 3.5–5.1)
Potassium: 4.1 mmol/L (ref 3.5–5.1)
SODIUM: 156 mmol/L — AB (ref 135–145)
Sodium: 161 mmol/L (ref 135–145)

## 2016-09-17 LAB — BLOOD CULTURE ID PANEL (REFLEXED)
Acinetobacter baumannii: NOT DETECTED
CANDIDA PARAPSILOSIS: NOT DETECTED
CANDIDA TROPICALIS: NOT DETECTED
Candida albicans: NOT DETECTED
Candida glabrata: NOT DETECTED
Candida krusei: NOT DETECTED
Enterobacter cloacae complex: NOT DETECTED
Enterobacteriaceae species: NOT DETECTED
Enterococcus species: NOT DETECTED
Escherichia coli: NOT DETECTED
HAEMOPHILUS INFLUENZAE: NOT DETECTED
KLEBSIELLA OXYTOCA: NOT DETECTED
KLEBSIELLA PNEUMONIAE: NOT DETECTED
Listeria monocytogenes: NOT DETECTED
Methicillin resistance: DETECTED — AB
NEISSERIA MENINGITIDIS: NOT DETECTED
PROTEUS SPECIES: NOT DETECTED
Pseudomonas aeruginosa: NOT DETECTED
STAPHYLOCOCCUS SPECIES: DETECTED — AB
STREPTOCOCCUS PYOGENES: NOT DETECTED
STREPTOCOCCUS SPECIES: NOT DETECTED
Serratia marcescens: NOT DETECTED
Staphylococcus aureus (BCID): DETECTED — AB
Streptococcus agalactiae: NOT DETECTED
Streptococcus pneumoniae: NOT DETECTED

## 2016-09-17 LAB — RENAL FUNCTION PANEL
ALBUMIN: 2.4 g/dL — AB (ref 3.5–5.0)
BUN: 101 mg/dL — ABNORMAL HIGH (ref 6–20)
CO2: 19 mmol/L — ABNORMAL LOW (ref 22–32)
Calcium: 8.4 mg/dL — ABNORMAL LOW (ref 8.9–10.3)
Chloride: >130 mmol/L (ref 101–111)
Creatinine, Ser: 4.8 mg/dL — ABNORMAL HIGH (ref 0.44–1.00)
GFR, EST AFRICAN AMERICAN: 10 mL/min — AB (ref >60)
GFR, EST NON AFRICAN AMERICAN: 9 mL/min — AB (ref >60)
Glucose, Bld: 127 mg/dL — ABNORMAL HIGH (ref 65–99)
PHOSPHORUS: 4.1 mg/dL (ref 2.5–4.6)
POTASSIUM: 4.1 mmol/L (ref 3.5–5.1)
Sodium: 159 mmol/L — ABNORMAL HIGH (ref 135–145)

## 2016-09-17 LAB — SODIUM, URINE, RANDOM: SODIUM UR: 45 mmol/L

## 2016-09-17 LAB — CBC
HEMATOCRIT: 32.5 % — AB (ref 36.0–46.0)
HEMOGLOBIN: 9.7 g/dL — AB (ref 12.0–15.0)
MCH: 26.8 pg (ref 26.0–34.0)
MCHC: 29.8 g/dL — ABNORMAL LOW (ref 30.0–36.0)
MCV: 89.8 fL (ref 78.0–100.0)
Platelets: 62 10*3/uL — ABNORMAL LOW (ref 150–400)
RBC: 3.62 MIL/uL — ABNORMAL LOW (ref 3.87–5.11)
RDW: 17.7 % — ABNORMAL HIGH (ref 11.5–15.5)
WBC: 11.2 10*3/uL — AB (ref 4.0–10.5)

## 2016-09-17 LAB — LACTIC ACID, PLASMA
Lactic Acid, Venous: 2.6 mmol/L (ref 0.5–1.9)
Lactic Acid, Venous: 1.5 mmol/L (ref 0.5–1.9)

## 2016-09-17 LAB — URINE CULTURE: Culture: NO GROWTH

## 2016-09-17 LAB — MRSA PCR SCREENING: MRSA BY PCR: POSITIVE — AB

## 2016-09-17 LAB — OSMOLALITY, URINE: OSMOLALITY UR: 347 mosm/kg (ref 300–900)

## 2016-09-17 LAB — GLUCOSE, CAPILLARY
GLUCOSE-CAPILLARY: 117 mg/dL — AB (ref 65–99)
GLUCOSE-CAPILLARY: 105 mg/dL — AB (ref 65–99)
Glucose-Capillary: 142 mg/dL — ABNORMAL HIGH (ref 65–99)
Glucose-Capillary: 81 mg/dL (ref 65–99)
Glucose-Capillary: 106 mg/dL — ABNORMAL HIGH (ref 65–99)
Glucose-Capillary: 119 mg/dL — ABNORMAL HIGH (ref 65–99)

## 2016-09-17 LAB — UREA NITROGEN, URINE: Urea Nitrogen, Ur: 1485 mg/dL

## 2016-09-17 LAB — CREATININE, URINE, RANDOM: CREATININE, URINE: 46.21 mg/dL

## 2016-09-17 MED ORDER — SODIUM CHLORIDE 0.9 % IV BOLUS (SEPSIS)
1000.0000 mL | Freq: Once | INTRAVENOUS | Status: AC
  Administered 2016-09-17: 1000 mL via INTRAVENOUS

## 2016-09-17 MED ORDER — CHLORHEXIDINE GLUCONATE CLOTH 2 % EX PADS
6.0000 | MEDICATED_PAD | Freq: Every day | CUTANEOUS | Status: AC
  Administered 2016-09-17 – 2016-09-21 (×5): 6 via TOPICAL

## 2016-09-17 MED ORDER — CHLORHEXIDINE GLUCONATE 0.12 % MT SOLN
15.0000 mL | Freq: Two times a day (BID) | OROMUCOSAL | Status: DC
  Administered 2016-09-17 – 2016-09-21 (×7): 15 mL via OROMUCOSAL
  Filled 2016-09-17 (×5): qty 15

## 2016-09-17 MED ORDER — ORAL CARE MOUTH RINSE
15.0000 mL | Freq: Two times a day (BID) | OROMUCOSAL | Status: DC
  Administered 2016-09-20 (×2): 15 mL via OROMUCOSAL

## 2016-09-17 MED ORDER — DEXTROSE 5 % IV SOLN
INTRAVENOUS | Status: DC
  Administered 2016-09-17 – 2016-09-18 (×2): via INTRAVENOUS

## 2016-09-17 MED ORDER — MUPIROCIN 2 % EX OINT
1.0000 "application " | TOPICAL_OINTMENT | Freq: Two times a day (BID) | CUTANEOUS | Status: DC
  Administered 2016-09-17 – 2016-09-20 (×7): 1 via NASAL
  Filled 2016-09-17 (×3): qty 22

## 2016-09-17 NOTE — Evaluation (Signed)
Clinical/Bedside Swallow Evaluation Patient Details  Name: Alexis Sanders MRN: [id number] Date of Birth: 04/03/1951  Today's Date: 09/17/2016 Time: SLP Start Time (ACUTE ONLY): 1057 SLP Stop Time (ACUTE ONLY): 1110 SLP Time Calculation (min) (ACUTE ONLY): 13 min  Past Medical History:  Past Medical History:  Diagnosis Date  . Bipolar 1 disorder ([facility])   . Diabetes mellitus without complication ([facility])   . Hypothyroidism   . Parkinson disease [facility])    Past Surgical History:  Past Surgical History:  Procedure Laterality Date  . ABDOMINAL HYSTERECTOMY    . CHOLECYSTECTOMY    . TONSILLECTOMY     HPI:  [age] yr female with hx DM, Parkinsons, Hypothyroid, and Bipolar Dz. Brought to ED for decreased level of responsiveness. Hosp about 2 wk ago for ? Pneu, UTI. Since post d/c husband noted progressive lower LOC (unclear what baseline is) at her nursing facility. CXR showed diffuse bilateral pulmonary infiltrates suggestive of congestive heart failure with bilateral pulmonary edema. Bilateral pneumonia cannot be excluded.   Assessment / Plan / Recommendation Clinical Impression  Pt repositioned for PO trials. Immediate and delayed coughing occurred given ice chips. Pt's cough was weak. No further trials were administered following prolonged coughing episode. Recommend pt remain NPO. Will follow along acutely.    Aspiration Risk  Severe aspiration risk    Diet Recommendation NPO   Medication Administration: Via alternative means    Other  Recommendations Oral Care Recommendations: Oral care QID   Follow up Recommendations Other (comment) (tba)      Frequency and Duration min 2x/week  2 weeks       Prognosis Prognosis for Safe Diet Advancement: Good Barriers to Reach Goals: Severity of deficits      Swallow Study   General HPI: [age] yr female with hx DM, Parkinsons, Hypothyroid, and Bipolar Dz. Brought to ED for decreased level of responsiveness. Hosp about 2 wk ago for ? Pneu,  UTI. Since post d/c husband noted progressive lower LOC (unclear what baseline is) at her nursing facility. CXR showed diffuse bilateral pulmonary infiltrates suggestive of congestive heart failure with bilateral pulmonary edema. Bilateral pneumonia cannot be excluded. Type of Study: Bedside Swallow Evaluation Previous Swallow Assessment: MBS 08/28/16 recommending Dys 1 and nectar thick liquids Diet Prior to this Study: NPO Temperature Spikes Noted: Yes Respiratory Status: Nasal cannula History of Recent Intubation: No Behavior/Cognition: Alert;Cooperative Oral Cavity Assessment: Within Functional Limits Oral Care Completed by SLP: No Oral Cavity - Dentition: Edentulous Self-Feeding Abilities: Total assist Patient Positioning: Upright in bed Baseline Vocal Quality: Low vocal intensity Volitional Cough: Weak    Oral/Motor/Sensory Function Overall Oral Motor/Sensory Function: Generalized oral weakness   Ice Chips Ice chips: Impaired Presentation: Spoon Pharyngeal Phase Impairments: Cough - Immediate;Cough - Delayed   Thin Liquid Thin Liquid: Not tested    Nectar Thick Nectar Thick Liquid: Not tested   Honey Thick Honey Thick Liquid: Not tested   Puree Puree: Not tested   Solid   GO   Solid: Not tested       Alexis EthHaleigh Ragan Marlan Sanders, Student SLP  Alexis NeverHaleigh R Uriah Sanders 09/17/2016,12:22 PM

## 2016-09-17 NOTE — Progress Notes (Signed)
PHARMACY - PHYSICIAN COMMUNICATION CRITICAL VALUE ALERT - BLOOD CULTURE IDENTIFICATION (BCID)  Results for orders placed or performed during the hospital encounter of October 04, 2016  Blood Culture ID Panel (Reflexed) (Collected: 2016/03/19  7:15 AM)  Result Value Ref Range   Enterococcus species NOT DETECTED NOT DETECTED   Listeria monocytogenes NOT DETECTED NOT DETECTED   Staphylococcus species DETECTED (A) NOT DETECTED   Staphylococcus aureus DETECTED (A) NOT DETECTED   Methicillin resistance DETECTED (A) NOT DETECTED   Streptococcus species NOT DETECTED NOT DETECTED   Streptococcus agalactiae NOT DETECTED NOT DETECTED   Streptococcus pneumoniae NOT DETECTED NOT DETECTED   Streptococcus pyogenes NOT DETECTED NOT DETECTED   Acinetobacter baumannii NOT DETECTED NOT DETECTED   Enterobacteriaceae species NOT DETECTED NOT DETECTED   Enterobacter cloacae complex NOT DETECTED NOT DETECTED   Escherichia coli NOT DETECTED NOT DETECTED   Klebsiella oxytoca NOT DETECTED NOT DETECTED   Klebsiella pneumoniae NOT DETECTED NOT DETECTED   Proteus species NOT DETECTED NOT DETECTED   Serratia marcescens NOT DETECTED NOT DETECTED   Haemophilus influenzae NOT DETECTED NOT DETECTED   Neisseria meningitidis NOT DETECTED NOT DETECTED   Pseudomonas aeruginosa NOT DETECTED NOT DETECTED   Candida albicans NOT DETECTED NOT DETECTED   Candida glabrata NOT DETECTED NOT DETECTED   Candida krusei NOT DETECTED NOT DETECTED   Candida parapsilosis NOT DETECTED NOT DETECTED   Candida tropicalis NOT DETECTED NOT DETECTED    Name of physician (or Provider) Contacted: Tama GanderKatherine Schorr, NP  Changes to prescribed antibiotics required: No changes needed. Pt on Vancomycin and Cefepime currently. ID will be consulted automatically.  Christoper Fabianaron Paislee Szatkowski, PharmD, BCPS Clinical pharmacist, pager [phone] 09/17/2016  6:54 AM

## 2016-09-17 NOTE — Progress Notes (Signed)
PROGRESS NOTE    Alexis Sanders Sanders  JXB:[id number] DOB: 08/23/1951 DOA: 09/11/2016 PCP: Quentin MullingAmanda Collier, PA-C    Brief Narrative:  Alexis Sanders Alexis Sanders is a 65 y.o. female passable history significant for diabetes, low thyroid, Parkinson'Sanders, bipolar presents emergency room with chief complaint of altered mental status per her nursing facility. [facility]. Patient has been skilled rehabilitation. Patient'Sanders husband states that she was recently spaced restarted on Klonopin, not completely certain. Feels that patient began to have decreased alertness after this about 2 days after discharge. He states after gradual decrease patient did not worsen. He questioned nursing home staff about this but never  Got an answer. The day of admission patient was sent to emergency room for evaluation for being less alert.   ED course: Started on sepsis protocol. Given think Zosyn. Blood cultures drawn. Urine sent for culture. Hospitalist was order flumazenil and patient responded well.   Assessment & Plan:   Principal Problem:   Sepsis (HCC) Active Problems:   Acute encephalopathy   UTI (urinary tract infection)   Hypothyroidism   Bipolar 1 disorder (HCC)   Parkinson'Sanders disease (HCC)   Diabetes mellitus type 2 in nonobese (HCC)   Acute renal failure superimposed on stage 3 chronic kidney disease (HCC)   Sepsis 2/2 UTI Patient hemodynamically stable Given 4L emergency room, will continue Given Vanc and zosyn in the emergency room, will continue vanc and switch zosyn to maxipime Vancomycin per pharm 2/2 kidney disease Urine culture No Growth Blood cultures positive for MRSA (infectious disease reflex consult)  Patient given 4 mL of fluid in the emergency room Lactic acid now WNL  Acidemia secondary to metabolic acidosis Treating sepsis Trend  AKI Baseline Cr 1.4, Cr on admit 6.45 4L of normal saline given in the emergency room Nephro  consulted  Troponemia Will trend EKG in AM Likely from low BP/sepsis Flattened  Candiuria Renally dosed diflucan (pharmacy confirmed), 14 day course  Hypernatremia Most recent weight in system from 11/16 139 Free water deficit 5.4 L continue hydration Serial sodiums BID Still replacing extracellular volume Started on 1/2NS Nephrology managing  Hypertension urgency When necessary hydralazine 10 mg IV as needed for severe blood pressure head CT negative Occurred after fluid resuscitation, slowly improving  Altered mental status Likely multifactorial Patient with sepsis and hyponatremia Possible sedation from Klonopin also a factor Patient responded to Flumazenill in the ED Avoid sedating meds  Bipolar Hold wellbutrin, abilify, klonopin, cymbalta  DM  SSI Holding lantus, nl dose 12u  Bladder issue Cont vesicare-->enablex  Parkinson'Sanders Disease Hold amantadine  Hypothyroidism Cont OP synthroid 125 mcg qd No signs of hyper or hypothyroidism  GERD Hold OP protonix 40mg  qd   Chronic pain Hold gabapentin  Code Status: DNR  DVT Prophylaxis: Heparin Family Communication: Son at bedside Disposition Plan: Unclear- son would like patient to be made comfortable but isn't ready to choose comfort measures only; will likely discharge to SNF possibly with hospice/ palliative care    Consultants:   Nephrology  Infectious Disease  Procedures:   None  Antimicrobials:   Vancomycin 11/29>  Zosyn 11/29  Cefepime 11/29>   Diflucan 11/29>   Subjective: Patient restless on exam.  Has tremor/ shaking but unclear if this is baseline.  Son is bedside.  States he would like patient to go back to SNF at time of discharge.  Repeatedly states patient would like be comfortable and that'Sanders what he would want but he is  not ready to transition completely to comfort based measures yet.  Patient nonverbal.    Objective: Vitals:   09/17/16 0332 09/17/16 0812  09/17/16 1003 09/17/16 1140  BP: 115/65   113/73  Pulse:   96 88  Resp: (!) 27  (!) 23 18  Temp: 99.8 F (37.7 C) (!) 100.7 F (38.2 C) (!) 101.3 F (38.5 C) 100.1 F (37.8 C)  TempSrc: Oral Oral Axillary Axillary  SpO2:   91% 96%  Weight: 67.2 kg (148 lb 2.4 oz)     Height:        Intake/Output Summary (Last 24 hours) at 09/17/16 1538 Last data filed at 09/17/16 1142  Gross per 24 hour  Intake          3223.33 ml  Output             1200 ml  Net          2023.33 ml   Filed Weights   09/25/16 0706 09/17/16 0332  Weight: 63 kg (139 lb) 67.2 kg (148 lb 2.4 oz)    Examination:  General exam: Appears calm and comfortable  Respiratory system: Crackles in middle lung fields. Respiratory effort normal. On 4L [place] Cardiovascular system: S1 & S2 heard, RRR. No JVD, murmurs, rubs, gallops or clicks. No pedal edema. Gastrointestinal system: Abdomen is nondistended, soft and nontender. No organomegaly or masses felt. Normal bowel sounds heard. Central nervous system: Alert unable to assess orientation. No focal neurological deficits. Extremities: patient able to move arms spontaneously bilaterally Skin: No rashes, lesions or ulcers Psychiatry: could not assess    Data Reviewed: I have personally reviewed following labs and imaging studies  CBC:  Recent Labs Lab Sep 25, 2016 0705 09/17/16 0319  WBC 20.1* 11.2*  NEUTROABS 17.3*  --   HGB 13.6 9.7*  HCT 44.6 32.5*  MCV 90.7 89.8  PLT 145* 62*   Basic Metabolic Panel:  Recent Labs Lab September 25, 2016 1043 September 25, 2016 1715 Sep 25, 2016 1953 09-25-16 2314 09/17/16 0319  NA 161* 156* 159* 156* 159*  K 4.8 4.4 4.5 4.1 4.1  CL >130* >130* >130* >130* >130*  CO2 20* 14* 19* 17* 19*  GLUCOSE 210* 199* 128* 128* 127*  BUN 114* 111* 113* 104* 101*  CREATININE 5.17* 5.25* 5.26* 5.04* 4.80*  CALCIUM 7.5* 7.9* 8.3* 8.2* 8.4*  MG 2.8*  --   --   --   --   PHOS 5.0*  --   --   --  4.1   GFR: Estimated Creatinine Clearance: 10.8 mL/min (by  C-G formula based on SCr of 4.8 mg/dL (H)). Liver Function Tests:  Recent Labs Lab 09-25-2016 0705 09/17/16 0319  AST 74*  --   ALT 98*  --   ALKPHOS 169*  --   BILITOT 0.7  --   PROT 7.0  --   ALBUMIN 3.6 2.4*   No results for input(Sanders): LIPASE, AMYLASE in the last 168 hours. No results for input(Sanders): AMMONIA in the last 168 hours. Coagulation Profile:  Recent Labs Lab 2016/09/25 0705  INR 1.37   Cardiac Enzymes:  Recent Labs Lab 09/25/16 0705 09/25/2016 1043 Sep 25, 2016 1715 Sep 25, 2016 1802  CKTOTAL  --   --  58  --   TROPONINI 0.05* 0.05*  --  0.06*   BNP (last 3 results) No results for input(Sanders): PROBNP in the last 8760 hours. HbA1C: No results for input(Sanders): HGBA1C in the last 72 hours. CBG:  Recent Labs Lab 25-Sep-2016 1955 09/25/2016 2323 09/17/16 0330 09/17/16 0815  09/17/16 1136  GLUCAP 112* 120* 117* 142* 81   Lipid Profile: No results for input(Sanders): CHOL, HDL, LDLCALC, TRIG, CHOLHDL, LDLDIRECT in the last 72 hours. Thyroid Function Tests:  Recent Labs  09/15/2016 1802  TSH 0.705   Anemia Panel: No results for input(Sanders): VITAMINB12, FOLATE, FERRITIN, TIBC, IRON, RETICCTPCT in the last 72 hours. Sepsis Labs:  Recent Labs Lab 09/02/2016 1043 08/22/2016 1715 09/08/2016 2314 09/17/16 0319  LATICACIDVEN 1.9 2.2* 2.6* 1.5    Recent Results (from the past 240 hour(Sanders))  Blood Culture (routine x 2)     Status: None (Preliminary result)   Collection Time: 09/07/2016  7:15 AM  Result Value Ref Range Status   Specimen Description BLOOD RIGHT HAND  Final   Special Requests BOTTLES DRAWN AEROBIC AND ANAEROBIC  5CC  Final   Culture  Setup Time   Final    GRAM POSITIVE COCCI IN CLUSTERS IN BOTH AEROBIC AND ANAEROBIC BOTTLES Organism ID to follow CRITICAL RESULT CALLED TO, READ BACK BY AND VERIFIED WITHChristoper Fabian: CARON AMEND, PHARMD @ [phone] 09/17/16 MKELLY,MLT    Culture PENDING  Incomplete   Report Status PENDING  Incomplete  Blood Culture ID Panel (Reflexed)     Status: Abnormal     Collection Time: 08/30/2016  7:15 AM  Result Value Ref Range Status   Enterococcus species NOT DETECTED NOT DETECTED Final   Listeria monocytogenes NOT DETECTED NOT DETECTED Final   Staphylococcus species DETECTED (A) NOT DETECTED Final    Comment: CRITICAL RESULT CALLED TO, READ BACK BY AND VERIFIED WITH: CARON AMEND,PHARMD @0554  09/17/16 MKELLY,MLT    Staphylococcus aureus DETECTED (A) NOT DETECTED Final    Comment: CRITICAL RESULT CALLED TO, READ BACK BY AND VERIFIED WITH: CARON AMEND,PHARMD @0554  09/17/16 MKELLY,MLT    Methicillin resistance DETECTED (A) NOT DETECTED Final    Comment: CRITICAL RESULT CALLED TO, READ BACK BY AND VERIFIED WITH: CARON AMEND,PHARMD @0554  09/17/16 MKELLY,MLT    Streptococcus species NOT DETECTED NOT DETECTED Final   Streptococcus agalactiae NOT DETECTED NOT DETECTED Final   Streptococcus pneumoniae NOT DETECTED NOT DETECTED Final   Streptococcus pyogenes NOT DETECTED NOT DETECTED Final   Acinetobacter baumannii NOT DETECTED NOT DETECTED Final   Enterobacteriaceae species NOT DETECTED NOT DETECTED Final   Enterobacter cloacae complex NOT DETECTED NOT DETECTED Final   Escherichia coli NOT DETECTED NOT DETECTED Final   Klebsiella oxytoca NOT DETECTED NOT DETECTED Final   Klebsiella pneumoniae NOT DETECTED NOT DETECTED Final   Proteus species NOT DETECTED NOT DETECTED Final   Serratia marcescens NOT DETECTED NOT DETECTED Final   Haemophilus influenzae NOT DETECTED NOT DETECTED Final   Neisseria meningitidis NOT DETECTED NOT DETECTED Final   Pseudomonas aeruginosa NOT DETECTED NOT DETECTED Final   Candida albicans NOT DETECTED NOT DETECTED Final   Candida glabrata NOT DETECTED NOT DETECTED Final   Candida krusei NOT DETECTED NOT DETECTED Final   Candida parapsilosis NOT DETECTED NOT DETECTED Final   Candida tropicalis NOT DETECTED NOT DETECTED Final  Urine culture     Status: None   Collection Time: 08/19/2016  7:42 AM  Result Value Ref Range Status    Specimen Description URINE, CATHETERIZED  Final   Special Requests NONE  Final   Culture NO GROWTH  Final   Report Status 09/17/2016 FINAL  Final  MRSA PCR Screening     Status: Abnormal   Collection Time: 09/17/2016  8:11 PM  Result Value Ref Range Status   MRSA by PCR POSITIVE (A) NEGATIVE Final  Comment:        The GeneXpert MRSA Assay (FDA approved for NASAL specimens only), is one component of a comprehensive MRSA colonization surveillance program. It is not intended to diagnose MRSA infection nor to guide or monitor treatment for MRSA infections. RESULT CALLED TO, READ BACK BY AND VERIFIED WITH: M.YORK,RN AT 0102 09/17/16 BY L.PITT          Radiology Studies: Dg Chest 1 View  Result Date: 09/17/2016 CLINICAL DATA:  Respiratory distress . EXAM: CHEST 1 VIEW COMPARISON:  07/08/2016 . FINDINGS: Mild cardiomegaly with diffuse bilateral pulmonary infiltrates suggesting congestive heart failure with bilateral pulmonary edema. Bilateral pneumonia cannot be excluded. No pleural effusion or pneumothorax. IMPRESSION: Cardiomegaly with diffuse bilateral pulmonary infiltrates. Findings suggest congestive heart failure with bilateral pulmonary edema. Bilateral pneumonia cannot be excluded . Electronically Signed   By: Maisie Fus  Register   On: 09/17/2016 10:22   Ct Head Wo Contrast  Result Date: 2016-09-22 CLINICAL DATA:  Acute encephalopathy. EXAM: CT HEAD WITHOUT CONTRAST TECHNIQUE: Contiguous axial images were obtained from the base of the skull through the vertex without intravenous contrast. COMPARISON:  CT scan of August 18, 2016. FINDINGS: Brain: Mild diffuse cortical atrophy is noted. No mass effect or midline shift is noted. Ventricular size is within normal limits. There is no evidence of mass lesion, hemorrhage or acute infarction. Vascular: Atherosclerosis of carotid siphons is noted. Skull: Normal. Negative for fracture or focal lesion. Sinuses/Orbits: No acute finding. Other:  None. IMPRESSION: Mild diffuse cortical atrophy. No acute intracranial abnormality seen. Electronically Signed   By: Lupita Raider, M.D.   On: 09/22/2016 12:47   US Renal  Result Date: 09/17/2016 CLINICAL DATA:  Acute onset of renal insufficiency. Initial encounter. EXAM: RENAL / URINARY TRACT ULTRASOUND COMPLETE COMPARISON:  CT of the abdomen and pelvis performed 08/27/2016 FINDINGS: Right Kidney: Length: 8.9 cm. Diffusely increased parenchymal echogenicity and cortical thinning may reflect a combination of chronic renal disease and medical renal disease. No mass or hydronephrosis visualized. Left Kidney: Length: 8.7 cm. Diffusely increased parenchymal echogenicity and cortical thinning may reflect a combination of chronic renal disease and medical renal disease. No mass or hydronephrosis visualized. Bladder: Mildly distended, with a Foley catheter in place. IMPRESSION: 1. No evidence of hydronephrosis. 2. Diffusely increased renal parenchymal echogenicity likely reflects medical renal disease. 3. Bilateral renal cortical thinning is concerning for chronic renal disease. Electronically Signed   By: Roanna Raider M.D.   On: 09/17/2016 07:06   Dg Chest Port 1 View  Result Date: 09/22/2016 CLINICAL DATA:  Shaking.  Fever. EXAM: PORTABLE CHEST 1 VIEW COMPARISON:  CT 08/27/2016. FINDINGS: Mediastinum and hilar structures are normal. Low lung volumes. Mild bibasilar pulmonary infiltrates. Heart size stable. Pulmonary vascularity normal . No pleural effusion or pneumothorax . IMPRESSION: Low lung volumes.  Mild bibasilar pulmonary infiltrates. Electronically Signed   By: Maisie Fus  Register   On: 09/22/2016 07:44        Scheduled Meds: . aspirin  300 mg Rectal Once  . ceFEPime (MAXIPIME) IV  500 mg Intravenous Q24H  . chlorhexidine  15 mL Mouth Rinse BID  . Chlorhexidine Gluconate Cloth  6 each Topical Q0600  . fluconazole (DIFLUCAN) IV  200 mg Intravenous Q24H  . heparin  5,000 Units Subcutaneous  Q8H  . insulin aspart  0-15 Units Subcutaneous Q4H  . mouth rinse  15 mL Mouth Rinse q12n4p  . mupirocin ointment  1 application Nasal BID  . sodium chloride flush  3 mL  Intravenous Q12H   Continuous Infusions: . dextrose 5 % and 0.45% NaCl 100 mL/hr at 09/05/2016 2300     LOS: 1 day    Time spent: 35 minutes    Katrinka Blazing, MD [facility] Pager [phone]  If 7PM-7AM, please contact night-coverage [email or web address] Password [facility] 09/17/2016, 3:38 PM

## 2016-09-17 NOTE — Progress Notes (Signed)
Subjective: Interval History: has no complaint but confused.  Objective: Vital signs in last 24 hours: Temp:  [97.9 F (36.6 C)-101.3 F (38.5 C)] 101.3 F (38.5 C) (11/30 1003) Pulse Rate:  [85-96] 96 (11/30 1003) Resp:  [16-27] 23 (11/30 1003) BP: (110-140)/(60-122) 115/65 (11/30 0332) SpO2:  [91 %-100 %] 91 % (11/30 1003) Weight:  [67.2 kg (148 lb 2.4 oz)] 67.2 kg (148 lb 2.4 oz) (11/30 0332) Weight change:   Intake/Output from previous day: 11/29 0701 - 11/30 0700 In: 7523.3 [I.V.:2023.3; IV Piggyback:5500] Out: 800 [Urine:800] Intake/Output this shift: No intake/output data recorded.  General appearance: cooperative and can answer questions, but slurred speech, tremulous.  Resp: clear to auscultation bilaterally Cardio: S1, S2 normal and systolic murmur: holosystolic 2/6, blowing at apex GI: soft, non-tender; bowel sounds normal; no masses,  no organomegaly Extremities: bruises, no edema  Lab Results:  Recent Labs  07/29/2016 0705 09/17/16 0319  WBC 20.1* 11.2*  HGB 13.6 9.7*  HCT 44.6 32.5*  PLT 145* 62*   BMET:  Recent Labs  07/29/2016 2314 09/17/16 0319  NA 156* 159*  K 4.1 4.1  CL >130* >130*  CO2 17* 19*  GLUCOSE 128* 127*  BUN 104* 101*  CREATININE 5.04* 4.80*  CALCIUM 8.2* 8.4*   No results for input(s): PTH in the last 72 hours. Iron Studies: No results for input(s): IRON, TIBC, TRANSFERRIN, FERRITIN in the last 72 hours.  Studies/Results: Dg Chest 1 View  Result Date: 09/17/2016 CLINICAL DATA:  Respiratory distress . EXAM: CHEST 1 VIEW COMPARISON:  07/08/2016 . FINDINGS: Mild cardiomegaly with diffuse bilateral pulmonary infiltrates suggesting congestive heart failure with bilateral pulmonary edema. Bilateral pneumonia cannot be excluded. No pleural effusion or pneumothorax. IMPRESSION: Cardiomegaly with diffuse bilateral pulmonary infiltrates. Findings suggest congestive heart failure with bilateral pulmonary edema. Bilateral pneumonia cannot be  excluded . Electronically Signed   By: Maisie Fushomas  Register   On: 09/17/2016 10:22   Ct Head Wo Contrast  Result Date: 11/27/2015 CLINICAL DATA:  Acute encephalopathy. EXAM: CT HEAD WITHOUT CONTRAST TECHNIQUE: Contiguous axial images were obtained from the base of the skull through the vertex without intravenous contrast. COMPARISON:  CT scan of August 18, 2016. FINDINGS: Brain: Mild diffuse cortical atrophy is noted. No mass effect or midline shift is noted. Ventricular size is within normal limits. There is no evidence of mass lesion, hemorrhage or acute infarction. Vascular: Atherosclerosis of carotid siphons is noted. Skull: Normal. Negative for fracture or focal lesion. Sinuses/Orbits: No acute finding. Other: None. IMPRESSION: Mild diffuse cortical atrophy. No acute intracranial abnormality seen. Electronically Signed   By: Lupita RaiderJames  Green Jr, M.D.   On: 11/27/2015 12:47   [place] Renal  Result Date: 09/17/2016 CLINICAL DATA:  Acute onset of renal insufficiency. Initial encounter. EXAM: RENAL / URINARY TRACT ULTRASOUND COMPLETE COMPARISON:  CT of the abdomen and pelvis performed 08/27/2016 FINDINGS: Right Kidney: Length: 8.9 cm. Diffusely increased parenchymal echogenicity and cortical thinning may reflect a combination of chronic renal disease and medical renal disease. No mass or hydronephrosis visualized. Left Kidney: Length: 8.7 cm. Diffusely increased parenchymal echogenicity and cortical thinning may reflect a combination of chronic renal disease and medical renal disease. No mass or hydronephrosis visualized. Bladder: Mildly distended, with a Foley catheter in place. IMPRESSION: 1. No evidence of hydronephrosis. 2. Diffusely increased renal parenchymal echogenicity likely reflects medical renal disease. 3. Bilateral renal cortical thinning is concerning for chronic renal disease. Electronically Signed   By: Roanna RaiderJeffery  Chang M.D.   On: 09/17/2016 07:06  Dg Chest Port 1 View  Result Date:  2016/08/16 CLINICAL DATA:  Shaking.  Fever. EXAM: PORTABLE CHEST 1 VIEW COMPARISON:  CT 08/27/2016. FINDINGS: Mediastinum and hilar structures are normal. Low lung volumes. Mild bibasilar pulmonary infiltrates. Heart size stable. Pulmonary vascularity normal . No pleural effusion or pneumothorax . IMPRESSION: Low lung volumes.  Mild bibasilar pulmonary infiltrates. Electronically Signed   By: Maisie Fushomas  Register   On: 2016/08/16 07:44    I have reviewed the patient's current medications.  Assessment/Plan: 1 AKI improving .  Na avid so cont hydration 2 ^ SNa slowly a little better . Will need to correct vol to help a lot . ?mild DI 3 anemia ? Dilution 4 Staph sepsis ? Source, will use AB 5 Yeast in urine 6 Schizo 7 bipolar  Avoid adding meds P 1/2 NS check OSM, cont AB    LOS: 1 day   Alexis Sanders L 09/17/2016,10:44 AM

## 2016-09-17 NOTE — Clinical Social Work Note (Signed)
Clinical Social Work Assessment  Patient Details  Name: Alexis Sanders MRN: [id number] Date of Birth: 07/02/1951  Date of referral:  09/17/16               Reason for consult:  Discharge Planning                Permission sought to share information with:  Facility Medical sales representativeContact Representative, Family Supports Permission granted to share information::  Yes, Verbal Permission Granted  Name::     Fayrene FearingJames May  Agency::  [facility]  Relationship::  Son  Contact Information:  [phone]  Housing/Transportation Living arrangements for the past 2 months:  Skilled Nursing Facility Source of Information:  Medical Team, Adult Children Patient Interpreter Needed:  None Criminal Activity/Legal Involvement Pertinent to Current Situation/Hospitalization:  No - Comment as needed Significant Relationships:  Adult Children, Spouse, Other Family Members Lives with:  Facility Resident Do you feel safe going back to the place where you live?  Yes Need for family participation in patient care:  Yes (Comment)  Care giving concerns:  Patient is from [facility].   Social Worker assessment / plan:  Patient not fully oriented and no supports in room. CSW called patient's son. CSW introduced role and explained that discharge planning would be discussed. Patient and her spouse are legally separated so, per Chiropodistassistant director of social work, that makes the patient's adult son her Management consultantdecision maker. Patient's son confirmed [facility] and the plan is to return once medically stable. She will need PTAR. Patient's son concerned that discharge is already being discussed after conversation with doctor today. CSW explained that the call was only to start getting a plan together. Patient's son expressed understanding. No further concerns. CSW encouraged patient's son to contact CSW as needed. CSW will continue to follow patient and her family for support and facilitate  discharge to SNF once medically stable.  Employment status:  Retired Health and safety inspectornsurance information:  Medicare PT Recommendations:  Not assessed at this time Information / Referral to community resources:  Skilled Nursing Facility  Patient/Family's Response to care:  Patient not fully oriented. Patient's son agreeable to return to SNF. Patient's family supportive and involved in patient's care. Patient's son appreciated social work intervention.  Patient/Family's Understanding of and Emotional Response to Diagnosis, Current Treatment, and Prognosis:  Patient not fully oriented. Patient's son understands and is agreeable to discharge plan so far. Patient's son appears happy with hospital care.  Emotional Assessment Appearance:  Appears stated age Attitude/Demeanor/Rapport:  Unable to Assess Affect (typically observed):  Unable to Assess Orientation:  Oriented to Self Alcohol / Substance use:  Never Used Psych involvement (Current and /or in the community):  No (Comment)  Discharge Needs  Concerns to be addressed:  Care Coordination Readmission within the last 30 days:  Yes Current discharge risk:  Cognitively Impaired, Dependent with Mobility Barriers to Discharge:  Continued Medical Work up   Margarito LinerSarah C Jurnie Garritano, LCSW 09/17/2016, 2:55 PM

## 2016-09-17 NOTE — Progress Notes (Signed)
Palliative Medicine consult noted. Due to high referral volume, there may be a delay seeing this patient. Please call the Palliative Medicine Team office at [phone] if recommendations are needed in the interim.  Thank you for inviting us to see this patient.  Lexington Krotz G. Koleton Duchemin, RN, BSN, CHPN 09/17/2016 3:04 PM Cell [phone] 8:00-4:00 Monday-Friday Office [phone] 

## 2016-09-17 NOTE — Consult Note (Addendum)
[facility]  Total days of antibiotics 2        Day 2 cefepime        Day 2 vanco        Day 2 fluc       Reason for Consult: auto consult for MRSA bacteremia   Referring Physician: Rinaldo Ratel  Principal Problem:   Sepsis (HCC) Active Problems:   Acute encephalopathy   UTI (urinary tract infection)   Hypothyroidism   Bipolar 1 disorder (HCC)   Parkinson's disease (HCC)   Diabetes mellitus type 2 in nonobese (HCC)   Acute renal failure superimposed on stage 3 chronic kidney disease (HCC)    HPI: Alexis Sanders is a 65 y.o. female with past medical history of parkinson's disease started on amantadine August 2017, DM2, Bipolar disorder, hypothyroidism, CKD who was recently hospitalized from 10/31-11/17 for aspiration pneumonia requiring intubation and UTI. She was discharged to SNF.She was readmitted on 11/29 for AMS, possibly related to starting klonopin and fevers of 101.53F. Her AMS did respond somewhat to flumazenil but she had other lab abnormalities which included WBc of 20K with left shift, LA 2.6. Sodium of 164, Cr up to 6.45 (BL 1.4). Her cxr concerning for bilateral patchy infiltrate/ pulmonary edema but worsened on repeat cxr today.she was empirically started on vanco, piptazo. Infectious work up showed 1/2 sets of blood cx with MRSA.her repeat labs are showing low plt of 67, and still ongoing hypernatremia of 161, and cr slightly improved at 4.43.LA normalized.  She remains encephalopathic  Past Medical History:  Diagnosis Date  . Bipolar 1 disorder (HCC)   . Diabetes mellitus without complication (HCC)   . Hypothyroidism   . Parkinson disease (HCC)     Allergies:  Allergies  Allergen Reactions  . Codeine Nausea Only  . Metformin Diarrhea  . Penicillins Rash    Has patient had a PCN reaction causing immediate rash, facial/tongue/throat swelling, SOB or lightheadedness with hypotension:Yes Has patient had a PCN reaction causing severe rash  involving mucus membranes or skin necrosis:unsure. Has patient had a PCN reaction that required hospitalization:No Has patient had a PCN reaction occurring within the last 10 years:No If all of the above answers are "NO", then may proceed with Cephalosporin use. Has patient had a PCN reaction causing immediate rash, faci     MEDICATIONS: . aspirin  300 mg Rectal Once  . ceFEPime (MAXIPIME) IV  500 mg Intravenous Q24H  . chlorhexidine  15 mL Mouth Rinse BID  . Chlorhexidine Gluconate Cloth  6 each Topical Q0600  . heparin  5,000 Units Subcutaneous Q8H  . insulin aspart  0-15 Units Subcutaneous Q4H  . mouth rinse  15 mL Mouth Rinse q12n4p  . mupirocin ointment  1 application Nasal BID  . sodium chloride flush  3 mL Intravenous Q12H    Social History  Substance Use Topics  . Smoking status: Former Games developer  . Smokeless tobacco: Never Used  . Alcohol use No    Family History  Problem Relation Age of Onset  . Parkinson's disease Neg Hx     Review of Systems -  Unable to obtain due to AMS, possibly baseline parkinson's  OBJECTIVE: Temp:  [98.8 F (37.1 C)-101.3 F (38.5 C)] 98.8 F (37.1 C) (11/30 1920) Pulse Rate:  [87-96] 87 (11/30 1920) Resp:  [18-28] 28 (11/30 1920) BP: (105-130)/(65-83) 105/71 (11/30 1920) SpO2:  [91 %-99 %] 94 % (11/30 1920) Weight:  [148 lb 2.4 oz (  67.2 kg)] 148 lb 2.4 oz (67.2 kg) (11/30 0332) Physical Exam  Constitutional:  Eyes open but not following command. appears stated age. No distress.  HENT: [place]/AT, PERRLA, no scleral icterus Mouth/Throat: Oropharynx is clear and moist. No oropharyngeal exudate.  Cardiovascular:distant heart sounds. Normal rate, regular rhythm, difficult to auscultate for murmurs No murmur heard.  Pulmonary/Chest: Effort normal and breath sounds normal. Bilateral rhonchi  Neck = supple, no nuchal rigidity Abdominal: Soft. Bowel sounds are normal.  exhibits no distension. There is no tenderness.  Lymphadenopathy: no  cervical adenopathy. No axillary adenopathy Skin: Skin is warm and dry. No rash noted. No erythema. No janeway or osler nodes. No pressure ulcers. Dry skin throughout. Has onychomychosis of great toe nails bilaterally    LABS: Lab Results  Component Value Date   WBC 11.2 (H) 09/17/2016   HGB 9.7 (L) 09/17/2016   HCT 32.5 (L) 09/17/2016   MCV 89.8 09/17/2016   PLT 62 (L) 09/17/2016   BMP Latest Ref Rng & Units 09/17/2016 09/17/2016 09/04/2016  Glucose 65 - 99 mg/dL 161(W110(H) 960(A127(H) 540(J128(H)  BUN 6 - 20 mg/dL 81(X90(H) 914(N101(H) 829(F104(H)  Creatinine 0.44 - 1.00 mg/dL 6.21(H4.43(H) 0.86(V4.80(H) 7.84(O5.04(H)  Sodium 135 - 145 mmol/L 161(HH) 159(H) 156(H)  Potassium 3.5 - 5.1 mmol/L 4.4 4.1 4.1  Chloride 101 - 111 mmol/L >130(HH) >130(HH) >130(HH)  CO2 22 - 32 mmol/L 18(L) 19(L) 17(L)  Calcium 8.9 - 10.3 mg/dL 8.9 9.6(E8.4(L) 9.5(M8.2(L)   MICRO: 11/29 blood cx + MRSA by BCID 11/29 urine cx pending  IMAGING: Dg Chest 1 View  Result Date: 09/17/2016 CLINICAL DATA:  Respiratory distress . EXAM: CHEST 1 VIEW COMPARISON:  07/08/2016 . FINDINGS: Mild cardiomegaly with diffuse bilateral pulmonary infiltrates suggesting congestive heart failure with bilateral pulmonary edema. Bilateral pneumonia cannot be excluded. No pleural effusion or pneumothorax. IMPRESSION: Cardiomegaly with diffuse bilateral pulmonary infiltrates. Findings suggest congestive heart failure with bilateral pulmonary edema. Bilateral pneumonia cannot be excluded . Electronically Signed   By: Maisie Fushomas  Register   On: 09/17/2016 10:22   Ct Head Wo Contrast  Result Date: 09/11/2016 CLINICAL DATA:  Acute encephalopathy. EXAM: CT HEAD WITHOUT CONTRAST TECHNIQUE: Contiguous axial images were obtained from the base of the skull through the vertex without intravenous contrast. COMPARISON:  CT scan of August 18, 2016. FINDINGS: Brain: Mild diffuse cortical atrophy is noted. No mass effect or midline shift is noted. Ventricular size is within normal limits. There is no  evidence of mass lesion, hemorrhage or acute infarction. Vascular: Atherosclerosis of carotid siphons is noted. Skull: Normal. Negative for fracture or focal lesion. Sinuses/Orbits: No acute finding. Other: None. IMPRESSION: Mild diffuse cortical atrophy. No acute intracranial abnormality seen. Electronically Signed   By: Lupita RaiderJames  Green Jr, M.D.   On: 09/02/2016 12:47   [place] Renal  Result Date: 09/17/2016 CLINICAL DATA:  Acute onset of renal insufficiency. Initial encounter. EXAM: RENAL / URINARY TRACT ULTRASOUND COMPLETE COMPARISON:  CT of the abdomen and pelvis performed 08/27/2016 FINDINGS: Right Kidney: Length: 8.9 cm. Diffusely increased parenchymal echogenicity and cortical thinning may reflect a combination of chronic renal disease and medical renal disease. No mass or hydronephrosis visualized. Left Kidney: Length: 8.7 cm. Diffusely increased parenchymal echogenicity and cortical thinning may reflect a combination of chronic renal disease and medical renal disease. No mass or hydronephrosis visualized. Bladder: Mildly distended, with a Foley catheter in place. IMPRESSION: 1. No evidence of hydronephrosis. 2. Diffusely increased renal parenchymal echogenicity likely reflects medical renal disease. 3. Bilateral renal cortical thinning is  concerning for chronic renal disease. Electronically Signed   By: Roanna RaiderJeffery  Chang M.D.   On: 09/17/2016 07:06   Dg Chest Port 1 View  Result Date: 08/28/2016 CLINICAL DATA:  Shaking.  Fever. EXAM: PORTABLE CHEST 1 VIEW COMPARISON:  CT 08/27/2016. FINDINGS: Mediastinum and hilar structures are normal. Low lung volumes. Mild bibasilar pulmonary infiltrates. Heart size stable. Pulmonary vascularity normal . No pleural effusion or pneumothorax . IMPRESSION: Low lung volumes.  Mild bibasilar pulmonary infiltrates. Electronically Signed   By: Maisie Fushomas  Register   On: 09/17/2016 07:44   Assessment/Plan:  65yo F with AMS and fevers found to have MRSA bacteremia, hypernatremia,  AKI, and worsening pulmonary edema./infiltrate. Unclear if the 2 days of AMS caused poor po intake to lead to significant AKI and hypernatremia vs. Likely had poor oral intake for several days.  - would recommend to narrow abtx to treat MRSA only. For now she is on cefepime and vanco - candiduria is often not considered significant. Would d/c fluconazole for now to focus on main culprit, MRSA - we are somewhat limited to how to treat her MRSA bacteremia if we also think that she has MRSA pneumonia.  - for now she is being treated with vancomycin (check random dose tomorrow) - if kidney function does not improve over the next 2 days and vanco is time to redose, can consider to change to ceftaroline (dapto has no lung penetration, and linezolid would be not be optimal if her platelets still remain low and not back to her baseline) - recommend repeat blood cx tomorrow - please get transthoracic echo to evaluate for vegetation  aki = slowly improving with fluids. Need to watch for not to over correct hypernatremia to place her at risk for CPM. Appreciate renal recs.  Thrombocytopenia = possibly due to infection. Continue to monitor. Check for HIT ab  Pulmonary edema/pneumonia = may have element of both. Current dose of vanco would treat MRSA pneumonia. If resp distress, may benefit from low dose diuresis.  Hypernatremia = appreciate renal recs. Primary team closely monitoring serum sodium as they are giving her IV fluids  AMS = multifactorial from parkinson's, hypernatremia, +/- sepsis. Continue to monitor. Avoid over sedation.   Demand ischemia = appears to have slightly elevated troponin at 0.05. Continue to trend out troponins to see if more c/w NSTEMI

## 2016-09-18 ENCOUNTER — Inpatient Hospital Stay (HOSPITAL_COMMUNITY): Payer: Medicare Other

## 2016-09-18 DIAGNOSIS — R778 Other specified abnormalities of plasma proteins: Secondary | ICD-10-CM

## 2016-09-18 DIAGNOSIS — Z515 Encounter for palliative care: Secondary | ICD-10-CM

## 2016-09-18 DIAGNOSIS — Z888 Allergy status to other drugs, medicaments and biological substances status: Secondary | ICD-10-CM

## 2016-09-18 DIAGNOSIS — Z87891 Personal history of nicotine dependence: Secondary | ICD-10-CM

## 2016-09-18 DIAGNOSIS — E860 Dehydration: Secondary | ICD-10-CM

## 2016-09-18 DIAGNOSIS — Z885 Allergy status to narcotic agent status: Secondary | ICD-10-CM

## 2016-09-18 DIAGNOSIS — R0600 Dyspnea, unspecified: Secondary | ICD-10-CM

## 2016-09-18 DIAGNOSIS — R0603 Acute respiratory distress: Secondary | ICD-10-CM

## 2016-09-18 DIAGNOSIS — D696 Thrombocytopenia, unspecified: Secondary | ICD-10-CM

## 2016-09-18 DIAGNOSIS — R7881 Bacteremia: Secondary | ICD-10-CM

## 2016-09-18 DIAGNOSIS — B379 Candidiasis, unspecified: Secondary | ICD-10-CM

## 2016-09-18 DIAGNOSIS — G20 Parkinson's disease: Secondary | ICD-10-CM

## 2016-09-18 DIAGNOSIS — E870 Hyperosmolality and hypernatremia: Secondary | ICD-10-CM

## 2016-09-18 DIAGNOSIS — J810 Acute pulmonary edema: Secondary | ICD-10-CM

## 2016-09-18 DIAGNOSIS — R4182 Altered mental status, unspecified: Secondary | ICD-10-CM

## 2016-09-18 DIAGNOSIS — J984 Other disorders of lung: Secondary | ICD-10-CM

## 2016-09-18 DIAGNOSIS — Z7189 Other specified counseling: Secondary | ICD-10-CM

## 2016-09-18 DIAGNOSIS — Z880 Allergy status to penicillin: Secondary | ICD-10-CM

## 2016-09-18 DIAGNOSIS — J189 Pneumonia, unspecified organism: Secondary | ICD-10-CM

## 2016-09-18 DIAGNOSIS — N179 Acute kidney failure, unspecified: Secondary | ICD-10-CM

## 2016-09-18 LAB — BASIC METABOLIC PANEL
BUN: 84 mg/dL — ABNORMAL HIGH (ref 6–20)
CO2: 17 mmol/L — ABNORMAL LOW (ref 22–32)
Calcium: 9 mg/dL (ref 8.9–10.3)
Chloride: >130 mmol/L (ref 101–111)
Creatinine, Ser: 4.35 mg/dL — ABNORMAL HIGH (ref 0.44–1.00)
GFR calc Af Amer: 11 mL/min — ABNORMAL LOW (ref >60)
GFR calc non Af Amer: 10 mL/min — ABNORMAL LOW (ref >60)
Glucose, Bld: 146 mg/dL — ABNORMAL HIGH (ref 65–99)
Potassium: 4.4 mmol/L (ref 3.5–5.1)
Sodium: 159 mmol/L — ABNORMAL HIGH (ref 135–145)

## 2016-09-18 LAB — GLUCOSE, CAPILLARY
GLUCOSE-CAPILLARY: 147 mg/dL — AB (ref 65–99)
Glucose-Capillary: 183 mg/dL — ABNORMAL HIGH (ref 65–99)

## 2016-09-18 LAB — CBC
HCT: 32.3 % — ABNORMAL LOW (ref 36.0–46.0)
Hemoglobin: 9.7 g/dL — ABNORMAL LOW (ref 12.0–15.0)
MCH: 27.1 pg (ref 26.0–34.0)
MCHC: 30 g/dL (ref 30.0–36.0)
MCV: 90.2 fL (ref 78.0–100.0)
PLATELETS: 53 10*3/uL — AB (ref 150–400)
RBC: 3.58 MIL/uL — ABNORMAL LOW (ref 3.87–5.11)
RDW: 18 % — AB (ref 11.5–15.5)
WBC: 9.4 10*3/uL (ref 4.0–10.5)

## 2016-09-18 LAB — RENAL FUNCTION PANEL
Albumin: 2.5 g/dL — ABNORMAL LOW (ref 3.5–5.0)
BUN: 84 mg/dL — ABNORMAL HIGH (ref 6–20)
CALCIUM: 9.1 mg/dL (ref 8.9–10.3)
CO2: 17 mmol/L — ABNORMAL LOW (ref 22–32)
CREATININE: 4.32 mg/dL — AB (ref 0.44–1.00)
GFR, EST AFRICAN AMERICAN: 11 mL/min — AB (ref >60)
GFR, EST NON AFRICAN AMERICAN: 10 mL/min — AB (ref >60)
Glucose, Bld: 219 mg/dL — ABNORMAL HIGH (ref 65–99)
Phosphorus: 4.8 mg/dL — ABNORMAL HIGH (ref 2.5–4.6)
Potassium: 4.8 mmol/L (ref 3.5–5.1)
SODIUM: 159 mmol/L — AB (ref 135–145)

## 2016-09-18 LAB — VANCOMYCIN, RANDOM: VANCOMYCIN RM: 13

## 2016-09-18 MED ORDER — LORAZEPAM 1 MG PO TABS: 1.0000 mg | ORAL_TABLET | ORAL | Status: DC | PRN

## 2016-09-18 MED ORDER — LORAZEPAM 2 MG/ML IJ SOLN
INTRAMUSCULAR | Status: AC
  Administered 2016-09-18: 0.5 mg via INTRAVENOUS
  Filled 2016-09-18: qty 1

## 2016-09-18 MED ORDER — HALOPERIDOL 0.5 MG PO TABS
0.5000 mg | ORAL_TABLET | ORAL | Status: DC | PRN
  Filled 2016-09-18: qty 1

## 2016-09-18 MED ORDER — MORPHINE SULFATE (CONCENTRATE) 10 MG/0.5ML PO SOLN
5.0000 mg | ORAL | Status: DC | PRN
  Administered 2016-09-18 – 2016-09-19 (×4): 5 mg via SUBLINGUAL
  Filled 2016-09-18 (×3): qty 0.5

## 2016-09-18 MED ORDER — LORAZEPAM 2 MG/ML IJ SOLN: 1.0000 mg | INTRAMUSCULAR | Status: DC | PRN

## 2016-09-18 MED ORDER — VANCOMYCIN HCL 10 G IV SOLR
1250.0000 mg | Freq: Once | INTRAVENOUS | Status: AC
  Administered 2016-09-18: 1250 mg via INTRAVENOUS
  Filled 2016-09-18: qty 1250

## 2016-09-18 MED ORDER — MIDAZOLAM HCL 2 MG/2ML IJ SOLN
1.0000 mg | INTRAMUSCULAR | Status: DC | PRN
  Administered 2016-09-18: 1 mg via INTRAVENOUS
  Filled 2016-09-18: qty 2

## 2016-09-18 MED ORDER — HALOPERIDOL LACTATE 5 MG/ML IJ SOLN: 0.5000 mg | INTRAMUSCULAR | Status: DC | PRN

## 2016-09-18 MED ORDER — MORPHINE SULFATE (PF) 2 MG/ML IV SOLN
1.0000 mg | INTRAVENOUS | Status: DC | PRN
  Administered 2016-09-18: 1 mg via INTRAVENOUS
  Filled 2016-09-18: qty 1

## 2016-09-18 MED ORDER — LORAZEPAM 2 MG/ML PO CONC: 1.0000 mg | ORAL | Status: DC | PRN

## 2016-09-18 MED ORDER — LORAZEPAM 2 MG/ML IJ SOLN
0.5000 mg | Freq: Once | INTRAMUSCULAR | Status: AC
  Administered 2016-09-18: 0.5 mg via INTRAVENOUS
  Filled 2016-09-18: qty 1

## 2016-09-18 MED ORDER — MORPHINE SULFATE (CONCENTRATE) 10 MG/0.5ML PO SOLN
5.0000 mg | ORAL | Status: DC | PRN
  Filled 2016-09-18: qty 0.5

## 2016-09-18 MED ORDER — HYDROMORPHONE BOLUS VIA INFUSION
0.5000 mg | INTRAVENOUS | Status: DC | PRN
  Administered 2016-09-18 – 2016-09-21 (×32): 0.5 mg via INTRAVENOUS
  Filled 2016-09-18: qty 1

## 2016-09-18 MED ORDER — HALOPERIDOL LACTATE 2 MG/ML PO CONC
0.5000 mg | ORAL | Status: DC | PRN
  Filled 2016-09-18: qty 0.3

## 2016-09-18 MED ORDER — DEXTROSE-NACL 5-0.2 % IV SOLN: INTRAVENOUS | Status: DC

## 2016-09-18 MED ORDER — LORAZEPAM 2 MG/ML IJ SOLN
0.5000 mg | Freq: Once | INTRAMUSCULAR | Status: AC
  Administered 2016-09-18: 0.5 mg via INTRAVENOUS

## 2016-09-18 MED ORDER — SODIUM CHLORIDE 0.9 % IV SOLN
0.5000 mg/h | INTRAVENOUS | Status: DC
  Administered 2016-09-18 – 2016-09-20 (×2): 0.5 mg/h via INTRAVENOUS
  Administered 2016-09-20: 2 mg/h via INTRAVENOUS
  Administered 2016-09-21 (×2): 2.5 mg/h via INTRAVENOUS
  Administered 2016-09-21: 4 mg/h via INTRAVENOUS
  Administered 2016-09-21: 4.5 mg/h via INTRAVENOUS
  Filled 2016-09-18 (×4): qty 2.5

## 2016-09-18 NOTE — Progress Notes (Signed)
Pharmacy Antibiotic Note  Alexis Sanders is a 65 y.o. female admitted from [facility] on 2015/12/01 with fevers, found to have MRSA sepsis  Vancomycin random level this AM = 13  Plan: Vancomycin 1250 mg iv x 1 dose this AM Continue Cefepime 500 mg iv Q 24 hours  Height: 5\' 3"  (160 cm) Weight: 141 lb 12.1 oz (64.3 kg) IBW/kg (Calculated) : 52.4  Temp (24hrs), Avg:100.1 F (37.8 C), Min:98.6 F (37 C), Max:101.3 F (38.5 C)   Recent Labs Lab Apr 30, 2016 0705  Apr 30, 2016 0958 Apr 30, 2016 1043 Apr 30, 2016 1715  Apr 30, 2016 2314 09/17/16 0319 09/17/16 1617 09/18/16 0044 09/18/16 0544  WBC 20.1*  --   --   --   --   --   --  11.2*  --   --  9.4  CREATININE 6.45*  --   --  5.17* 5.25*  < > 5.04* 4.80* 4.43* 4.35* 4.32*  LATICACIDVEN  --   < > 1.47 1.9 2.2*  --  2.6* 1.5  --   --   --   VANCORANDOM  --   --   --   --   --   --   --   --   --   --  13  < > = values in this interval not displayed.  Estimated Creatinine Clearance: 11.7 mL/min (by C-G formula based on SCr of 4.32 mg/dL (H)).    Allergies  Allergen Reactions  . Codeine Nausea Only  . Metformin Diarrhea  . Penicillins Rash    Has patient had a PCN reaction causing immediate rash, facial/tongue/throat swelling, SOB or lightheadedness with hypotension:Yes Has patient had a PCN reaction causing severe rash involving mucus membranes or skin necrosis:unsure. Has patient had a PCN reaction that required hospitalization:No Has patient had a PCN reaction occurring within the last 10 years:No If all of the above answers are "NO", then may proceed with Cephalosporin use. Has patient had a PCN reaction causing immediate rash, faci   Thank you Okey RegalLisa Myrikal Messmer, PharmD [phone] 09/18/2016 7:33 AM

## 2016-09-18 NOTE — Progress Notes (Signed)
Called patient's son Alexis Sanders and had lengthy discussion with patient's current medical status.  She is requiring more supplemental oxygen, she appears agitated, she is febrile, her platelets are low and kidney function has not improved much from admission.  Overall, patient current status is quite guarded.  Patient's son voices understanding and states he just wants patient to be made comfortable.  Stated this would likely transition patient to comfort care only and hospice patient's son stated he wanted to talk to his stepfather.  However, after this phone call I did receive a page from nurse that patient had higher oxygen requirement and appeared to be in pain and agitated.  I called patient son again and told him patient was declining and I voiced concern that decisions of interventions would need to be made.  He stated "please make my mother comfortable"  And "I don't want her to be in anymore pain".  I arrived bedside and patient was actively agitated, on non rebreather mask.  I called patient's husband and updated him.  He had just spoken with patient's son and they are both in agreement with comfort based measures only.  Patient has numerous medical comorbities and is critically ill.  Palliative/ comfort based order set initiated.

## 2016-09-18 NOTE — Progress Notes (Addendum)
Page came for Rapid Response Team, RR RN, AC, and RT arrived.  Patient was DNR and comfort care was initiated. RRT page cancelled, bedsides RN present  Suggestions made for morphine and or midazolam for comfort.  MD present.  Per MD, will not need RRT interventions, RNs informed to call me back if they need any assistance.

## 2016-09-18 NOTE — Consult Note (Signed)
Consultation Note Date: 09/18/2016   Patient Name: Alexis Sanders  DOB: December 23, 1950  MRN: [id number]  Age / Sex: 65 y.o., female  PCP: No primary care provider on file. Referring Physician: Eber Jones, MD  Reason for Consultation: Non pain symptom management, Pain control, Psychosocial/spiritual support and Terminal Care  HPI/Patient Profile: 65 y.o. female   admitted on 08/25/2016     Clinical Assessment and Goals of Care:  Alexis Sanders a 65 y.o.femalepassable history significant for diabetes, low thyroid, Parkinson's, bipolar, recently hospitalized for PNA, sent to skilled SNF for rehab, she was seen by palliative care at that time for goals of care. She has a son Jeneen Rinks May, she reportedly also has a daughter as per son Jeneen Rinks May, he does not know how to get in touch with her. She is reportedly legally separated from her husband. She has been admitted to the hospital since 08/21/2016 for sepsis secondary to UTI, being seen by ID, also diagnosed with MRSA bacteremia and also MRSA PNA. She has ongoing worsening acute kidney injury, also with elevated troponins, hypernatremia.   Palliative care was consulted for further goals of care discussions. The patient was seen in am on 09-18-16. She was having a lot of tremors, she was not able to interact, she was on NRB mask. Call was placed and message was left for son Jeneen Rinks May.   Later today, around 12 noon, the patient was noted to have ongoing agitation, not awake/ not alert. She was with high O2 requirements, in distress. Dr Adair Patter with [facility] discussed with son Cheryle Horsfall who requested full scope of comfort measures and subsequently also arrived at the bedside where I met with him this afternoon.   I introduced myself and palliative care as follows: Palliative medicine is specialized medical care for people living with serious illness. It focuses on  providing relief from the symptoms and stress of a serious illness. The goal is to improve quality of life for both the patient and the family.  Discussed about patient's extensive symptom burden, that she was probably hours to days for estimated prognosis. We discussed that she has extensive acute and underlying morbidities and that we would like to focus exclusively on making sure she passes away peacefully and comfortably, son Jeneen Rinks is tearful but in agreement, he states that the patient's ex husband is on his way to the hospital. See recommendations below, thank you for the consult.   NEXT OF KIN  son Jeneen Rinks May now at the bedside.   SUMMARY OF RECOMMENDATIONS    DNR DNI Comfort measures only Dilaudid continuous infusion for comfort and bolus-only infusion also available.  Versed IV PRN Chaplain consult, comfort cart for family Prognosis hours to days Anticipated hospital death, discussed gently with son.   Code Status/Advance Care Planning:  DNR    Symptom Management:      Palliative Prophylaxis:   Bowel Regimen  Psycho-social/Spiritual:   Desire for further Chaplaincy support:yes  Additional Recommendations: Education on Hospice  Prognosis:   Hours -  Days  Discharge Planning: Anticipated Hospital Death      Primary Diagnoses: Present on Admission: . Sepsis ([place]) . Hypothyroidism . Bipolar 1 disorder ([place]) . Parkinson's disease ([place]) . Acute renal failure superimposed on stage 3 chronic kidney disease ([place]) . Acute encephalopathy . UTI (urinary tract infection)   I have reviewed the medical record, interviewed the patient and family, and examined the patient. The following aspects are pertinent.  Past Medical History:  Diagnosis Date  . Bipolar 1 disorder ([place])   . Diabetes mellitus without complication ([place])   . Hypothyroidism   . Parkinson disease [facility])    Social History   Social History  . Marital status: Married    Spouse name: N/A  . Number  of children: N/A  . Years of education: N/A   Social History Main Topics  . Smoking status: Former Research scientist (life sciences)  . Smokeless tobacco: Never Used  . Alcohol use No  . Drug use: No  . Sexual activity: Not Asked   Other Topics Concern  . None   Social History Narrative  . None   Family History  Problem Relation Age of Onset  . Parkinson's disease Neg Hx    Scheduled Meds: . ceFEPime (MAXIPIME) IV  500 mg Intravenous Q24H  . chlorhexidine  15 mL Mouth Rinse BID  . Chlorhexidine Gluconate Cloth  6 each Topical Q0600  . heparin  5,000 Units Subcutaneous Q8H  . mouth rinse  15 mL Mouth Rinse q12n4p  . mupirocin ointment  1 application Nasal BID  . sodium chloride flush  3 mL Intravenous Q12H   Continuous Infusions: . dextrose 5 % and 0.2 % NaCl    . HYDROmorphone     PRN Meds:.acetaminophen **OR** acetaminophen, albuterol, flumazenil, haloperidol **OR** haloperidol **OR** haloperidol lactate, hydrALAZINE, HYDROmorphone **AND** HYDROmorphone, midazolam, morphine CONCENTRATE **OR** morphine CONCENTRATE, ondansetron **OR** ondansetron (ZOFRAN) IV Medications Prior to Admission:  Prior to Admission medications   Medication Sig Start Date End Date Taking? Authorizing Provider  amantadine (SYMMETREL) 100 MG capsule Take 100 mg by mouth 2 (two) times daily.   Yes Historical Provider, MD  Amino Acids-Protein Hydrolys (FEEDING SUPPLEMENT, PRO-STAT SUGAR FREE 64,) LIQD Take 30 mLs by mouth 3 (three) times daily. 09/04/16  Yes Eber Jones, MD  ARIPiprazole (ABILIFY) 10 MG tablet Take 10 mg by mouth every evening.   Yes Historical Provider, MD  buPROPion (WELLBUTRIN) 100 MG tablet Take 100 mg by mouth daily.   Yes Historical Provider, MD  clonazePAM (KLONOPIN) 1 MG tablet Take 1 mg by mouth 2 (two) times daily.   Yes Historical Provider, MD  DULoxetine (CYMBALTA) 30 MG capsule Take 30 mg by mouth 2 (two) times daily.   Yes Historical Provider, MD  feeding supplement, GLUCERNA SHAKE,  (GLUCERNA SHAKE) LIQD Take 237 mLs by mouth 3 (three) times daily between meals. 09/04/16  Yes Eber Jones, MD  gabapentin (NEURONTIN) 250 MG/5ML solution Take 4 mLs (200 mg total) by mouth 2 (two) times daily. 09/04/16  Yes Eber Jones, MD  insulin aspart (NOVOLOG FLEXPEN) 100 UNIT/ML FlexPen Inject 0-15 Units into the skin See admin instructions. Bs<150=0 units, 151-200=4 units, 201-250=6 units, 251-300=8 units, 301-350=10 units, 351-400=12 units, >400=15 units and notify MD   Yes Historical Provider, MD  insulin glargine (LANTUS) 100 UNIT/ML injection Inject 0.12 mLs (12 Units total) into the skin at bedtime. 09/04/16  Yes Eber Jones, MD  levothyroxine (SYNTHROID, LEVOTHROID) 125 MCG tablet Take 125 mcg by mouth daily before breakfast.  Yes Historical Provider, MD  montelukast (SINGULAIR) 10 MG tablet Take 10 mg by mouth at bedtime.   Yes Historical Provider, MD  pantoprazole (PROTONIX) 40 MG tablet Take 40 mg by mouth every evening.   Yes Historical Provider, MD  promethazine (PHENERGAN) 25 MG tablet Take 25 mg by mouth every 6 (six) hours as needed for nausea or vomiting.   Yes Historical Provider, MD  solifenacin (VESICARE) 5 MG tablet Take 5 mg by mouth daily.   Yes Historical Provider, MD   Allergies  Allergen Reactions  . Codeine Nausea Only  . Metformin Diarrhea  . Penicillins Rash    Has patient had a PCN reaction causing immediate rash, facial/tongue/throat swelling, SOB or lightheadedness with hypotension:Yes Has patient had a PCN reaction causing severe rash involving mucus membranes or skin necrosis:unsure. Has patient had a PCN reaction that required hospitalization:No Has patient had a PCN reaction occurring within the last 10 years:No If all of the above answers are "NO", then may proceed with Cephalosporin use. Has patient had a PCN reaction causing immediate rash, faci   Review of Systems + for pain, anxiety, dyspnea.   Physical Exam Weak  frail elderly appearing lady Moderate to severe distress Tremors Abdomen soft Coarse breath sounds anteriorly S1 S2 Edema Not awake not alert  Vital Signs: BP 105/60 (BP Location: Left Arm)   Pulse 90   Temp (!) 100.9 F (38.3 C) (Axillary)   Resp (!) 24   Ht _0  (1.6 m)   Wt 64.3 kg (141 lb 12.1 oz)   SpO2 92%   BMI 25.11 kg/m  Pain Assessment: CPOT   Pain Score: 0-No pain   SpO2: SpO2: 92 % O2 Device:SpO2: 92 % O2 Flow Rate: .O2 Flow Rate (L/min): 6 L/min  IO: Intake/output summary:  Intake/Output Summary (Last 24 hours) at 09/18/16 1253 Last data filed at 09/18/16 0730  Gross per 24 hour  Intake          2448.33 ml  Output             1050 ml  Net          1398.33 ml    LBM: Last BM Date: 09/17/16 Baseline Weight: Weight: 63 kg (139 lb) Most recent weight: Weight: 64.3 kg (141 lb 12.1 oz)     Palliative Assessment/Data:   Flowsheet Rows   Flowsheet Row Most Recent Value  Intake Tab  Referral Department  Hospitalist  Unit at Time of Referral  Intermediate Care Unit  Palliative Care Primary Diagnosis  Sepsis/Infectious Disease  Date Notified  09/17/16  Palliative Care Type  Return patient Palliative Care  Reason for referral  Pain, Non-pain Symptom, End of Life Care Assistance, Clarify Goals of Care  Date of Admission  08/22/2016  Date first seen by Palliative Care  09/18/16  # of days IP prior to Palliative referral  1  Clinical Assessment  Palliative Performance Scale Score  20%  Pain Max last 24 hours  7  Pain Min Last 24 hours  6  Dyspnea Max Last 24 Hours  8  Dyspnea Min Last 24 hours  7  Nausea Max Last 24 Hours  6  Nausea Min Last 24 Hours  5  Anxiety Max Last 24 Hours  5  Psychosocial & Spiritual Assessment  Palliative Care Outcomes  Patient/Family meeting held?  Yes  Who was at the meeting?  patient's son Jeneen Rinks May.       Time In: 11 Time Out: 12.30 Time  Total: 90 min  Greater than 50%  of this time was spent counseling and  coordinating care related to the above assessment and plan.  Signed by: Loistine Chance, MD  [phone]  Please contact Palliative Medicine Team phone at [phone] for questions and concerns.  For individual provider: See Shea Evans

## 2016-09-18 NOTE — Progress Notes (Signed)
SLP Cancellation Note  Patient Details Name: Alexis Sanders MRN: [id number] DOB: 04/06/1951   Cancelled treatment:       Reason Eval/Treat Not Completed: Medical issues which prohibited therapy. Pt with frequent significant changes in oxygen levels ranging from 76 to 100, rattling breath sounds and appeared SOB. Pt also with significant full-body tremors. RN informed. Did not feel it was safe to attempt PO trials at this time. Will continue attempts.   [place], Amy K, MA, CCC-SLP 09/18/2016, 10:27 AM [phone]

## 2016-09-18 NOTE — Progress Notes (Signed)
Subjective: Interval History: not coherent.  Objective: Vital signs in last 24 hours: Temp:  [98.6 F (37 C)-101.3 F (38.5 C)] 100.9 F (38.3 C) (12/01 0700) Pulse Rate:  [87-113] 90 (12/01 0515) Resp:  [18-30] 24 (12/01 0515) BP: (103-113)/(60-73) 105/60 (12/01 0440) SpO2:  [91 %-100 %] 92 % (12/01 0515) Weight:  [64.3 kg (141 lb 12.1 oz)] 64.3 kg (141 lb 12.1 oz) (12/01 0500) Weight change: 1.25 kg (2 lb 12.1 oz)  Intake/Output from previous day: 11/30 0701 - 12/01 0700 In: 2448.3 [I.V.:2298.3; IV Piggyback:150] Out: 1600 [Urine:1600] Intake/Output this shift: Total I/O In: -  Out: 150 [Urine:150]  General appearance: moderate distress, toxic, uncooperative and not coop, opens eyes Resp: rales bilaterally and rhonchi bilaterally Cardio: S1, S2 normal and systolic murmur: holosystolic 2/6, blowing at apex GI: pos bs , liver down 4 cm,soft Extremities: extremities normal, atraumatic, no cyanosis or edema  Lab Results:  Recent Labs  09/17/16 0319 09/18/16 0544  WBC 11.2* 9.4  HGB 9.7* 9.7*  HCT 32.5* 32.3*  PLT 62* 53*   BMET:  Recent Labs  09/18/16 0044 09/18/16 0544  NA 159* 159*  K 4.4 4.8  CL >130* >130*  CO2 17* 17*  GLUCOSE 146* 219*  BUN 84* 84*  CREATININE 4.35* 4.32*  CALCIUM 9.0 9.1   No results for input(s): PTH in the last 72 hours. Iron Studies: No results for input(s): IRON, TIBC, TRANSFERRIN, FERRITIN in the last 72 hours.  Studies/Results: Dg Chest 1 View  Result Date: 09/17/2016 CLINICAL DATA:  Respiratory distress . EXAM: CHEST 1 VIEW COMPARISON:  07/08/2016 . FINDINGS: Mild cardiomegaly with diffuse bilateral pulmonary infiltrates suggesting congestive heart failure with bilateral pulmonary edema. Bilateral pneumonia cannot be excluded. No pleural effusion or pneumothorax. IMPRESSION: Cardiomegaly with diffuse bilateral pulmonary infiltrates. Findings suggest congestive heart failure with bilateral pulmonary edema. Bilateral pneumonia  cannot be excluded . Electronically Signed   By: Maisie Fushomas  Register   On: 09/17/2016 10:22   Ct Head Wo Contrast  Result Date: 09/08/2016 CLINICAL DATA:  Acute encephalopathy. EXAM: CT HEAD WITHOUT CONTRAST TECHNIQUE: Contiguous axial images were obtained from the base of the skull through the vertex without intravenous contrast. COMPARISON:  CT scan of August 18, 2016. FINDINGS: Brain: Mild diffuse cortical atrophy is noted. No mass effect or midline shift is noted. Ventricular size is within normal limits. There is no evidence of mass lesion, hemorrhage or acute infarction. Vascular: Atherosclerosis of carotid siphons is noted. Skull: Normal. Negative for fracture or focal lesion. Sinuses/Orbits: No acute finding. Other: None. IMPRESSION: Mild diffuse cortical atrophy. No acute intracranial abnormality seen. Electronically Signed   By: Lupita RaiderJames  Green Jr, M.D.   On: 09/07/2016 12:47   [place] Renal  Result Date: 09/17/2016 CLINICAL DATA:  Acute onset of renal insufficiency. Initial encounter. EXAM: RENAL / URINARY TRACT ULTRASOUND COMPLETE COMPARISON:  CT of the abdomen and pelvis performed 08/27/2016 FINDINGS: Right Kidney: Length: 8.9 cm. Diffusely increased parenchymal echogenicity and cortical thinning may reflect a combination of chronic renal disease and medical renal disease. No mass or hydronephrosis visualized. Left Kidney: Length: 8.7 cm. Diffusely increased parenchymal echogenicity and cortical thinning may reflect a combination of chronic renal disease and medical renal disease. No mass or hydronephrosis visualized. Bladder: Mildly distended, with a Foley catheter in place. IMPRESSION: 1. No evidence of hydronephrosis. 2. Diffusely increased renal parenchymal echogenicity likely reflects medical renal disease. 3. Bilateral renal cortical thinning is concerning for chronic renal disease. Electronically Signed   By: Leotis ShamesJeffery  Chang M.D.   On: 09/17/2016 07:06   Dg Chest Port 1 View  Result Date:  09/18/2016 CLINICAL DATA:  Acute onset of shortness of breath. Initial encounter. EXAM: PORTABLE CHEST 1 VIEW COMPARISON:  Chest radiograph performed 09/17/2016 FINDINGS: Improving bilateral airspace opacification may reflect pulmonary edema or pneumonia, still worse on the right. No pleural effusion or pneumothorax is seen. The cardiomediastinal silhouette is normal in size. No acute osseous abnormalities are identified. IMPRESSION: Bilateral airspace opacification is improved from the prior study, and may reflect residual pulmonary edema or pneumonia. Electronically Signed   By: Roanna RaiderJeffery  Chang M.D.   On: 09/18/2016 01:03    I have reviewed the patient's current medications.  Assessment/Plan: 1 AKI slowly better. Still vol deficit and water deficit. But resp difficult so slow IVF and cont Na and water repletion 2 REsp suspect pneumonia as primary issue, try to minimize vol. 3 Staph sepsis on ab 4 bipolar 5 Extrapyramidal syndrome suspect that this may be issue not [place] 6 [place] 7 Anemia 8 ^SNa slow change avoid rapid P limit vol use hypotonic but include Na in ivf, cont AB, family counseling    LOS: 2 days   Eliakim Tendler L 09/18/2016,8:43 AM

## 2016-09-18 NOTE — Progress Notes (Signed)
PROGRESS NOTE    Alexis Sanders  BJY:[id number] DOB: [birth year] DOA: Sep 24, 2016 PCP: No primary care provider on file.    Brief Narrative:  Alexis Sanders is a 65 y.o. female passable history significant for diabetes, low thyroid, Parkinson's, bipolar presents emergency room with chief complaint of altered mental status per her nursing facility. Patient was recently discharged from [facility] diagnosis of pneumonia. Patient has been skilled rehabilitation. Patient's husband states that she was recently spaced restarted on Klonopin, not completely certain. Feels that patient began to have decreased alertness after this about 2 days after discharge. He states after gradual decrease patient did not worsen. He questioned nursing home staff about this but never  Got an answer. The day of admission patient was sent to emergency room for evaluation for being less alert.   ED course: Started on sepsis protocol. Given think Zosyn. Blood cultures drawn. Urine sent for culture. Hospitalist was order flumazenil and patient responded well.  However, further deteriorated and her oxygen requirement increased during her hospitalization.  Her son and husband were notified and decision to make patient comfort care was made.  Patient transitioned to sole comfort care.  Hospital death anticipated.   Assessment & Plan:   Principal Problem:   Sepsis (HCC) Active Problems:   Acute encephalopathy   UTI (urinary tract infection)   Hypothyroidism   Bipolar 1 disorder (HCC)   Parkinson's disease (HCC)   Diabetes mellitus type 2 in nonobese (HCC)   Acute renal failure superimposed on stage 3 chronic kidney disease (HCC)   Dehydration   Healthcare-associated pneumonia   MRSA bacteremia   Hypernatremia   Sepsis 2/2 UTI Given 4L emergency room, will continue Given Vanc and zosyn in the emergency room, will continue vanc and switch zosyn to maxipime Vancomycin per pharm 2/2 kidney disease Antibiotics  stopped Anticipate hospital death  Acidemia secondary to metabolic acidosis Labs canceled  AKI Baseline Cr 1.4, Cr on admit 6.45 4L of normal saline given in the emergency room Nephro consulted  Troponemia Flattened  Candiuria Not clinically significant per ID  Hypernatremia Most recent weight in system from 11/16 139 Free water deficit 5.4 L continue hydration Serial sodiums BID Nephrology managing Family requested all lines be discontinued  Hypertension urgency No longer monitoring BP  Altered mental status Likely multifactorial Patient with sepsis and hyponatremia Comfort Care  Code Status: DNR/ comfort care DVT Prophylaxis: Heparin Family Communication: Son at bedside Disposition Plan: anticipate hospital death    Consultants:   Nephrology  Infectious Disease  Procedures:   None  Antimicrobials:   Vancomycin 11/29>  Zosyn 11/29  Cefepime 11/29>   Diflucan 11/29>   Subjective: Patient restless on exam.  Has tremor/ shaking but unclear if this is baseline.  Son is bedside.  States he would like patient to go back to SNF at time of discharge.  Repeatedly states patient would like be comfortable and that's what he would want but he is not ready to transition completely to comfort based measures yet.  Patient nonverbal.    Objective: Vitals:   09/18/16 0500 09/18/16 0515 09/18/16 0700 09/18/16 1533  BP:      Pulse:  90    Resp:  (!) 24    Temp:   (!) 100.9 F (38.3 C)   TempSrc:   Axillary   SpO2:  92%  97%  Weight: 64.3 kg (141 lb 12.1 oz)     Height:        Intake/Output Summary (Last  24 hours) at 09/18/16 1722 Last data filed at 09/18/16 0730  Gross per 24 hour  Intake          2448.33 ml  Output             1050 ml  Net          1398.33 ml   Filed Weights   05-29-2016 0706 09/17/16 0332 09/18/16 0500  Weight: 63 kg (139 lb) 67.2 kg (148 lb 2.4 oz) 64.3 kg (141 lb 12.1 oz)    Examination:  General exam: Appears calm  and comfortable  Respiratory system: Crackles in middle and lower lung fields. Increased respiratory effort, on 6L oxymask Cardiovascular system: S1 & S2 heard, RRR. No JVD, murmurs, rubs, gallops or clicks. No pedal edema. Gastrointestinal system: Abdomen is nondistended, soft and nontender. No organomegaly or masses felt. Normal bowel sounds heard. Central nervous system: Alert. Extremities: patient able to move arms spontaneously bilaterally Skin: No rashes, lesions or ulcers Psychiatry: could not assess    Data Reviewed: I have personally reviewed following labs and imaging studies  CBC:  Recent Labs Lab 05-29-2016 0705 09/17/16 0319 09/18/16 0544  WBC 20.1* 11.2* 9.4  NEUTROABS 17.3*  --   --   HGB 13.6 9.7* 9.7*  HCT 44.6 32.5* 32.3*  MCV 90.7 89.8 90.2  PLT 145* 62* 53*   Basic Metabolic Panel:  Recent Labs Lab 05-29-2016 1043  05-29-2016 2314 09/17/16 0319 09/17/16 1617 09/18/16 0044 09/18/16 0544  NA 161*  < > 156* 159* 161* 159* 159*  K 4.8  < > 4.1 4.1 4.4 4.4 4.8  CL >130*  < > >130* >130* >130* >130* >130*  CO2 20*  < > 17* 19* 18* 17* 17*  GLUCOSE 210*  < > 128* 127* 110* 146* 219*  BUN 114*  < > 104* 101* 90* 84* 84*  CREATININE 5.17*  < > 5.04* 4.80* 4.43* 4.35* 4.32*  CALCIUM 7.5*  < > 8.2* 8.4* 8.9 9.0 9.1  MG 2.8*  --   --   --   --   --   --   PHOS 5.0*  --   --  4.1  --   --  4.8*  < > = values in this interval not displayed. GFR: Estimated Creatinine Clearance: 11.7 mL/min (by C-G formula based on SCr of 4.32 mg/dL (H)). Liver Function Tests:  Recent Labs Lab 05-29-2016 0705 09/17/16 0319 09/18/16 0544  AST 74*  --   --   ALT 98*  --   --   ALKPHOS 169*  --   --   BILITOT 0.7  --   --   PROT 7.0  --   --   ALBUMIN 3.6 2.4* 2.5*   No results for input(s): LIPASE, AMYLASE in the last 168 hours. No results for input(s): AMMONIA in the last 168 hours. Coagulation Profile:  Recent Labs Lab 05-29-2016 0705  INR 1.37   Cardiac  Enzymes:  Recent Labs Lab 05-29-2016 0705 05-29-2016 1043 05-29-2016 1715 05-29-2016 1802  CKTOTAL  --   --  58  --   TROPONINI 0.05* 0.05*  --  0.06*   BNP (last 3 results) No results for input(s): PROBNP in the last 8760 hours. HbA1C: No results for input(s): HGBA1C in the last 72 hours. CBG:  Recent Labs Lab 09/17/16 1748 09/17/16 2013 09/17/16 2311 09/18/16 0435 09/18/16 0752  GLUCAP 106* 105* 119* 183* 147*   Lipid Profile: No results for input(s): CHOL, HDL, LDLCALC,  TRIG, CHOLHDL, LDLDIRECT in the last 72 hours. Thyroid Function Tests:  Recent Labs  09/08/2016 1802  TSH 0.705   Anemia Panel: No results for input(s): VITAMINB12, FOLATE, FERRITIN, TIBC, IRON, RETICCTPCT in the last 72 hours. Sepsis Labs:  Recent Labs Lab 09/08/2016 1043 09/07/2016 1715 09/14/2016 2314 09/17/16 0319  LATICACIDVEN 1.9 2.2* 2.6* 1.5    Recent Results (from the past 240 hour(s))  Blood Culture (routine x 2)     Status: Abnormal (Preliminary result)   Collection Time: 08/25/2016  7:15 AM  Result Value Ref Range Status   Specimen Description BLOOD RIGHT HAND  Final   Special Requests BOTTLES DRAWN AEROBIC AND ANAEROBIC  5CC  Final   Culture  Setup Time   Final    GRAM POSITIVE COCCI IN CLUSTERS IN BOTH AEROBIC AND ANAEROBIC BOTTLES CRITICAL RESULT CALLED TO, READ BACK BY AND VERIFIED WITHRolan Bucco: CARON AMEND, PHARMD @ [phone] 09/17/16 MKELLY,MLT    Culture (A)  Final    STAPHYLOCOCCUS AUREUS SUSCEPTIBILITIES TO FOLLOW    Report Status PENDING  Incomplete  Blood Culture ID Panel (Reflexed)     Status: Abnormal   Collection Time: 09/11/2016  7:15 AM  Result Value Ref Range Status   Enterococcus species NOT DETECTED NOT DETECTED Final   Listeria monocytogenes NOT DETECTED NOT DETECTED Final   Staphylococcus species DETECTED (A) NOT DETECTED Final    Comment: CRITICAL RESULT CALLED TO, READ BACK BY AND VERIFIED WITH: CARON AMEND,PHARMD @0554  09/17/16 MKELLY,MLT    Staphylococcus aureus DETECTED  (A) NOT DETECTED Final    Comment: CRITICAL RESULT CALLED TO, READ BACK BY AND VERIFIED WITH: CARON AMEND,PHARMD @0554  09/17/16 MKELLY,MLT    Methicillin resistance DETECTED (A) NOT DETECTED Final    Comment: CRITICAL RESULT CALLED TO, READ BACK BY AND VERIFIED WITH: CARON AMEND,PHARMD @0554  09/17/16 MKELLY,MLT    Streptococcus species NOT DETECTED NOT DETECTED Final   Streptococcus agalactiae NOT DETECTED NOT DETECTED Final   Streptococcus pneumoniae NOT DETECTED NOT DETECTED Final   Streptococcus pyogenes NOT DETECTED NOT DETECTED Final   Acinetobacter baumannii NOT DETECTED NOT DETECTED Final   Enterobacteriaceae species NOT DETECTED NOT DETECTED Final   Enterobacter cloacae complex NOT DETECTED NOT DETECTED Final   Escherichia coli NOT DETECTED NOT DETECTED Final   Klebsiella oxytoca NOT DETECTED NOT DETECTED Final   Klebsiella pneumoniae NOT DETECTED NOT DETECTED Final   Proteus species NOT DETECTED NOT DETECTED Final   Serratia marcescens NOT DETECTED NOT DETECTED Final   Haemophilus influenzae NOT DETECTED NOT DETECTED Final   Neisseria meningitidis NOT DETECTED NOT DETECTED Final   Pseudomonas aeruginosa NOT DETECTED NOT DETECTED Final   Candida albicans NOT DETECTED NOT DETECTED Final   Candida glabrata NOT DETECTED NOT DETECTED Final   Candida krusei NOT DETECTED NOT DETECTED Final   Candida parapsilosis NOT DETECTED NOT DETECTED Final   Candida tropicalis NOT DETECTED NOT DETECTED Final  Blood Culture (routine x 2)     Status: None (Preliminary result)   Collection Time: 09/11/2016  7:27 AM  Result Value Ref Range Status   Specimen Description BLOOD RIGHT WRIST  Final   Special Requests BOTTLES DRAWN AEROBIC AND ANAEROBIC  5CC  Final   Culture NO GROWTH 2 DAYS  Final   Report Status PENDING  Incomplete  Urine culture     Status: None   Collection Time: 09/11/2016  7:42 AM  Result Value Ref Range Status   Specimen Description URINE, CATHETERIZED  Final   Special Requests  NONE  Final  Culture NO GROWTH  Final   Report Status 09/17/2016 FINAL  Final  MRSA PCR Screening     Status: Abnormal   Collection Time: 09/07/2016  8:11 PM  Result Value Ref Range Status   MRSA by PCR POSITIVE (A) NEGATIVE Final    Comment:        The GeneXpert MRSA Assay (FDA approved for NASAL specimens only), is one component of a comprehensive MRSA colonization surveillance program. It is not intended to diagnose MRSA infection nor to guide or monitor treatment for MRSA infections. RESULT CALLED TO, READ BACK BY AND VERIFIED WITH: M.YORK,RN AT 0102 09/17/16 BY L.PITT          Radiology Studies: Dg Chest 1 View  Result Date: 09/17/2016 CLINICAL DATA:  Respiratory distress . EXAM: CHEST 1 VIEW COMPARISON:  07/08/2016 . FINDINGS: Mild cardiomegaly with diffuse bilateral pulmonary infiltrates suggesting congestive heart failure with bilateral pulmonary edema. Bilateral pneumonia cannot be excluded. No pleural effusion or pneumothorax. IMPRESSION: Cardiomegaly with diffuse bilateral pulmonary infiltrates. Findings suggest congestive heart failure with bilateral pulmonary edema. Bilateral pneumonia cannot be excluded . Electronically Signed   By: Maisie Fus  Register   On: 09/17/2016 10:22   US Renal  Result Date: 09/17/2016 CLINICAL DATA:  Acute onset of renal insufficiency. Initial encounter. EXAM: RENAL / URINARY TRACT ULTRASOUND COMPLETE COMPARISON:  CT of the abdomen and pelvis performed 08/27/2016 FINDINGS: Right Kidney: Length: 8.9 cm. Diffusely increased parenchymal echogenicity and cortical thinning may reflect a combination of chronic renal disease and medical renal disease. No mass or hydronephrosis visualized. Left Kidney: Length: 8.7 cm. Diffusely increased parenchymal echogenicity and cortical thinning may reflect a combination of chronic renal disease and medical renal disease. No mass or hydronephrosis visualized. Bladder: Mildly distended, with a Foley catheter in  place. IMPRESSION: 1. No evidence of hydronephrosis. 2. Diffusely increased renal parenchymal echogenicity likely reflects medical renal disease. 3. Bilateral renal cortical thinning is concerning for chronic renal disease. Electronically Signed   By: Roanna Raider M.D.   On: 09/17/2016 07:06   Dg Chest Port 1 View  Result Date: 09/18/2016 CLINICAL DATA:  Acute onset of shortness of breath. Initial encounter. EXAM: PORTABLE CHEST 1 VIEW COMPARISON:  Chest radiograph performed 09/17/2016 FINDINGS: Improving bilateral airspace opacification may reflect pulmonary edema or pneumonia, still worse on the right. No pleural effusion or pneumothorax is seen. The cardiomediastinal silhouette is normal in size. No acute osseous abnormalities are identified. IMPRESSION: Bilateral airspace opacification is improved from the prior study, and may reflect residual pulmonary edema or pneumonia. Electronically Signed   By: Roanna Raider M.D.   On: 09/18/2016 01:03        Scheduled Meds: . chlorhexidine  15 mL Mouth Rinse BID  . Chlorhexidine Gluconate Cloth  6 each Topical Q0600  . mouth rinse  15 mL Mouth Rinse q12n4p  . mupirocin ointment  1 application Nasal BID   Continuous Infusions: . HYDROmorphone 0.5 mg/hr (09/18/16 1332)     LOS: 2 days    Time spent: 40 minutes    Katrinka Blazing, MD [facility] Pager [phone]  If 7PM-7AM, please contact night-coverage [email or web address] Password [email or web address] 09/18/2016, 5:22 PM

## 2016-09-18 NOTE — Progress Notes (Signed)
@  0012 Paged Merdis DelayK. Schorr of [facility] regarding pt's increased WOB and O2 demand (now on 6L [place] up from 2L 24 hours ago and exerting greater effort to breath RR in 20s-30s). Also, inquired if pt's tremors which seem to be progressively worsening could be caused by more than her Parkinson's (pt hasn't stopped tremoring since admission and tremors seem more intense than on admission). Pt's psych meds and scheduled benzos abruptly stopped on admission.  Page returned @0027 . STAT chest xray ordered along with low dose IV Ativan to address potential withdrawal from abruptly stopping home benzos. Will administer and continue to monitor.  Update @0211 . Pt asleep 30 minutes post Ativan administration and tremors stopped. Chest xray indicates improvement over prior imaging. Kirtland BouchardK. Schorr paged and updated.

## 2016-09-18 DEATH — deceased

## 2016-09-19 DIAGNOSIS — R0602 Shortness of breath: Secondary | ICD-10-CM

## 2016-09-19 LAB — RENAL FUNCTION PANEL
ALBUMIN: 2.1 g/dL — AB (ref 3.5–5.0)
BUN: 93 mg/dL — AB (ref 6–20)
CO2: 14 mmol/L — ABNORMAL LOW (ref 22–32)
Calcium: 8.8 mg/dL — ABNORMAL LOW (ref 8.9–10.3)
Creatinine, Ser: 4.79 mg/dL — ABNORMAL HIGH (ref 0.44–1.00)
GFR calc Af Amer: 10 mL/min — ABNORMAL LOW (ref >60)
GFR calc non Af Amer: 9 mL/min — ABNORMAL LOW (ref >60)
GLUCOSE: 228 mg/dL — AB (ref 65–99)
PHOSPHORUS: 6.3 mg/dL — AB (ref 2.5–4.6)
POTASSIUM: 5.1 mmol/L (ref 3.5–5.1)
Sodium: 163 mmol/L (ref 135–145)

## 2016-09-19 LAB — CULTURE, BLOOD (ROUTINE X 2)

## 2016-09-19 NOTE — Progress Notes (Signed)
PROGRESS NOTE    Alexis Sanders  BJY:[id number] DOB: 1951/10/03 DOA: 09/11/2016 PCP: No primary care provider on file.    Brief Narrative:  Alexis Sanders is a 65 y.o. female passable history significant for diabetes, low thyroid, Parkinson's, bipolar presents emergency room with chief complaint of altered mental status per her nursing facility. Patient was recently discharged from [facility] diagnosis of pneumonia. Patient has been skilled rehabilitation. Patient's husband states that she was recently spaced restarted on Klonopin, not completely certain. Feels that patient began to have decreased alertness after this about 2 days after discharge. He states after gradual decrease patient did not worsen. He questioned nursing home staff about this but never  Got an answer. The day of admission patient was sent to emergency room for evaluation for being less alert.   ED course: Started on sepsis protocol. Zosyn. Blood cultures drawn. Urine sent for culture. Hospitalist ordered flumazenil and patient responded well.  However, further deteriorated and her oxygen requirement increased during her hospitalization.  Her son and husband were notified and decision to make patient comfort care was made.  Patient transitioned to sole comfort care.  Hospital death anticipated.   Assessment & Plan:   Principal Problem:   Sepsis (HCC) Active Problems:   Acute encephalopathy   UTI (urinary tract infection)   Hypothyroidism   Bipolar 1 disorder (HCC)   Parkinson's disease (HCC)   Diabetes mellitus type 2 in nonobese (HCC)   Acute renal failure superimposed on stage 3 chronic kidney disease (HCC)   Dehydration   Healthcare-associated pneumonia   MRSA bacteremia   Hypernatremia   Candidiasis   Respiratory distress   Sepsis 2/2 UTI Given 4L emergency room, will continue Given Vanc and zosyn in the emergency room, will continue vanc and switch zosyn to maxipime Vancomycin per pharm 2/2 kidney  disease Antibiotics stopped per family wishes that patient not attached to lines Anticipate hospital death  Acidemia secondary to metabolic acidosis Labs canceled  AKI Baseline Cr 1.4, Cr on admit 6.45 4L of normal saline given in the emergency room Nephro consulted  Troponemia Flattened  Candiuria Not clinically significant per ID  Hypernatremia Nephrology managing Family requested all lines be discontinued  Hypertension urgency No longer monitoring BP  Altered mental status Likely multifactorial Patient with sepsis and hyponatremia Comfort Care  Code Status: DNR/ comfort care DVT Prophylaxis: Heparin Family Communication: Husband at bedside Disposition Plan: anticipate hospital death    Consultants:   Nephrology  Infectious Disease  Palliative Care  Procedures:   None  Antimicrobials:   Vancomycin 11/29> 12/1  Zosyn 11/29> 12/1  Cefepime 11/29> 12/1  Diflucan 11/29> 12/1   Subjective: Patient slightly awake- not interactive.  Looks comfortable.  Transitioned to comfort based measures only yesterday.  Objective: Vitals:   09/18/16 1533 09/18/16 1806 09/18/16 2037 09/19/16 0700  BP:   (!) 124/51   Pulse:   (!) 111   Resp:   (!) 35   Temp:   (!) 100.4 F (38 C) 97.6 F (36.4 C)  TempSrc:   Axillary Axillary  SpO2: 97% (!) 86% (!) 79%   Weight:      Height:        Intake/Output Summary (Last 24 hours) at 09/19/16 0803 Last data filed at 09/19/16 0700  Gross per 24 hour  Intake             4.48 ml  Output  950 ml  Net          -945.52 ml   Filed Weights   09/07/2016 0706 09/17/16 0332 09/18/16 0500  Weight: 63 kg (139 lb) 67.2 kg (148 lb 2.4 oz) 64.3 kg (141 lb 12.1 oz)    Examination:  General exam: Appears calm and comfortable  Respiratory system: Crackles in middle and lower lung fields. Increased respiratory effort, on 6L oxymask Cardiovascular system: S1 & S2 heard, RRR. No JVD, murmurs, rubs, gallops or  clicks. No pedal edema. Gastrointestinal system: Abdomen is nondistended, soft and nontender. No organomegaly or masses felt. Normal bowel sounds heard. Central nervous system: not alert Extremities: did not follow commans Skin: No rashes, lesions or ulcers Psychiatry: could not assess    Data Reviewed: I have personally reviewed following labs and imaging studies  CBC:  Recent Labs Lab 08/26/2016 0705 09/17/16 0319 09/18/16 0544  WBC 20.1* 11.2* 9.4  NEUTROABS 17.3*  --   --   HGB 13.6 9.7* 9.7*  HCT 44.6 32.5* 32.3*  MCV 90.7 89.8 90.2  PLT 145* 62* 53*   Basic Metabolic Panel:  Recent Labs Lab 09/12/2016 1043  09/17/16 0319 09/17/16 1617 09/18/16 0044 09/18/16 0544 09/19/16 0406  NA 161*  < > 159* 161* 159* 159* 163*  K 4.8  < > 4.1 4.4 4.4 4.8 5.1  CL >130*  < > >130* >130* >130* >130* >130*  CO2 20*  < > 19* 18* 17* 17* 14*  GLUCOSE 210*  < > 127* 110* 146* 219* 228*  BUN 114*  < > 101* 90* 84* 84* 93*  CREATININE 5.17*  < > 4.80* 4.43* 4.35* 4.32* 4.79*  CALCIUM 7.5*  < > 8.4* 8.9 9.0 9.1 8.8*  MG 2.8*  --   --   --   --   --   --   PHOS 5.0*  --  4.1  --   --  4.8* 6.3*  < > = values in this interval not displayed. GFR: Estimated Creatinine Clearance: 10.6 mL/min (by C-G formula based on SCr of 4.79 mg/dL (H)). Liver Function Tests:  Recent Labs Lab 09/13/2016 0705 09/17/16 0319 09/18/16 0544 09/19/16 0406  AST 74*  --   --   --   ALT 98*  --   --   --   ALKPHOS 169*  --   --   --   BILITOT 0.7  --   --   --   PROT 7.0  --   --   --   ALBUMIN 3.6 2.4* 2.5* 2.1*   No results for input(s): LIPASE, AMYLASE in the last 168 hours. No results for input(s): AMMONIA in the last 168 hours. Coagulation Profile:  Recent Labs Lab 08/26/2016 0705  INR 1.37   Cardiac Enzymes:  Recent Labs Lab 08/23/2016 0705 09/02/2016 1043 09/01/2016 1715 09/17/2016 1802  CKTOTAL  --   --  58  --   TROPONINI 0.05* 0.05*  --  0.06*   BNP (last 3 results) No results for  input(s): PROBNP in the last 8760 hours. HbA1C: No results for input(s): HGBA1C in the last 72 hours. CBG:  Recent Labs Lab 09/17/16 1748 09/17/16 2013 09/17/16 2311 09/18/16 0435 09/18/16 0752  GLUCAP 106* 105* 119* 183* 147*   Lipid Profile: No results for input(s): CHOL, HDL, LDLCALC, TRIG, CHOLHDL, LDLDIRECT in the last 72 hours. Thyroid Function Tests:  Recent Labs  08/30/2016 1802  TSH 0.705   Anemia Panel: No results for input(s): VITAMINB12,  FOLATE, FERRITIN, TIBC, IRON, RETICCTPCT in the last 72 hours. Sepsis Labs:  Recent Labs Lab 08/24/2016 1043 09/03/2016 1715 09/06/2016 2314 09/17/16 0319  LATICACIDVEN 1.9 2.2* 2.6* 1.5    Recent Results (from the past 240 hour(s))  Blood Culture (routine x 2)     Status: Abnormal (Preliminary result)   Collection Time: 09/15/2016  7:15 AM  Result Value Ref Range Status   Specimen Description BLOOD RIGHT HAND  Final   Special Requests BOTTLES DRAWN AEROBIC AND ANAEROBIC  5CC  Final   Culture  Setup Time   Final    GRAM POSITIVE COCCI IN CLUSTERS IN BOTH AEROBIC AND ANAEROBIC BOTTLES CRITICAL RESULT CALLED TO, READ BACK BY AND VERIFIED WITHRolan Bucco AMEND, PHARMD @ [phone] 09/17/16 MKELLY,MLT    Culture (A)  Final    STAPHYLOCOCCUS AUREUS SUSCEPTIBILITIES TO FOLLOW    Report Status PENDING  Incomplete  Blood Culture ID Panel (Reflexed)     Status: Abnormal   Collection Time: 08/31/2016  7:15 AM  Result Value Ref Range Status   Enterococcus species NOT DETECTED NOT DETECTED Final   Listeria monocytogenes NOT DETECTED NOT DETECTED Final   Staphylococcus species DETECTED (A) NOT DETECTED Final    Comment: CRITICAL RESULT CALLED TO, READ BACK BY AND VERIFIED WITH: CARON AMEND,PHARMD @0554  09/17/16 MKELLY,MLT    Staphylococcus aureus DETECTED (A) NOT DETECTED Final    Comment: CRITICAL RESULT CALLED TO, READ BACK BY AND VERIFIED WITH: CARON AMEND,PHARMD @0554  09/17/16 MKELLY,MLT    Methicillin resistance DETECTED (A) NOT  DETECTED Final    Comment: CRITICAL RESULT CALLED TO, READ BACK BY AND VERIFIED WITH: CARON AMEND,PHARMD @0554  09/17/16 MKELLY,MLT    Streptococcus species NOT DETECTED NOT DETECTED Final   Streptococcus agalactiae NOT DETECTED NOT DETECTED Final   Streptococcus pneumoniae NOT DETECTED NOT DETECTED Final   Streptococcus pyogenes NOT DETECTED NOT DETECTED Final   Acinetobacter baumannii NOT DETECTED NOT DETECTED Final   Enterobacteriaceae species NOT DETECTED NOT DETECTED Final   Enterobacter cloacae complex NOT DETECTED NOT DETECTED Final   Escherichia coli NOT DETECTED NOT DETECTED Final   Klebsiella oxytoca NOT DETECTED NOT DETECTED Final   Klebsiella pneumoniae NOT DETECTED NOT DETECTED Final   Proteus species NOT DETECTED NOT DETECTED Final   Serratia marcescens NOT DETECTED NOT DETECTED Final   Haemophilus influenzae NOT DETECTED NOT DETECTED Final   Neisseria meningitidis NOT DETECTED NOT DETECTED Final   Pseudomonas aeruginosa NOT DETECTED NOT DETECTED Final   Candida albicans NOT DETECTED NOT DETECTED Final   Candida glabrata NOT DETECTED NOT DETECTED Final   Candida krusei NOT DETECTED NOT DETECTED Final   Candida parapsilosis NOT DETECTED NOT DETECTED Final   Candida tropicalis NOT DETECTED NOT DETECTED Final  Blood Culture (routine x 2)     Status: None (Preliminary result)   Collection Time: 09/03/2016  7:27 AM  Result Value Ref Range Status   Specimen Description BLOOD RIGHT WRIST  Final   Special Requests BOTTLES DRAWN AEROBIC AND ANAEROBIC  5CC  Final   Culture NO GROWTH 2 DAYS  Final   Report Status PENDING  Incomplete  Urine culture     Status: None   Collection Time: 09/13/2016  7:42 AM  Result Value Ref Range Status   Specimen Description URINE, CATHETERIZED  Final   Special Requests NONE  Final   Culture NO GROWTH  Final   Report Status 09/17/2016 FINAL  Final  MRSA PCR Screening     Status: Abnormal   Collection Time: 09/11/2016  8:11 PM  Result Value Ref Range  Status   MRSA by PCR POSITIVE (A) NEGATIVE Final    Comment:        The GeneXpert MRSA Assay (FDA approved for NASAL specimens only), is one component of a comprehensive MRSA colonization surveillance program. It is not intended to diagnose MRSA infection nor to guide or monitor treatment for MRSA infections. RESULT CALLED TO, READ BACK BY AND VERIFIED WITH: M.YORK,RN AT 0102 09/17/16 BY L.PITT          Radiology Studies: Dg Chest 1 View  Result Date: 09/17/2016 CLINICAL DATA:  Respiratory distress . EXAM: CHEST 1 VIEW COMPARISON:  07/08/2016 . FINDINGS: Mild cardiomegaly with diffuse bilateral pulmonary infiltrates suggesting congestive heart failure with bilateral pulmonary edema. Bilateral pneumonia cannot be excluded. No pleural effusion or pneumothorax. IMPRESSION: Cardiomegaly with diffuse bilateral pulmonary infiltrates. Findings suggest congestive heart failure with bilateral pulmonary edema. Bilateral pneumonia cannot be excluded . Electronically Signed   By: Maisie Fushomas  Register   On: 09/17/2016 10:22   Dg Chest Port 1 View  Result Date: 09/18/2016 CLINICAL DATA:  Acute onset of shortness of breath. Initial encounter. EXAM: PORTABLE CHEST 1 VIEW COMPARISON:  Chest radiograph performed 09/17/2016 FINDINGS: Improving bilateral airspace opacification may reflect pulmonary edema or pneumonia, still worse on the right. No pleural effusion or pneumothorax is seen. The cardiomediastinal silhouette is normal in size. No acute osseous abnormalities are identified. IMPRESSION: Bilateral airspace opacification is improved from the prior study, and may reflect residual pulmonary edema or pneumonia. Electronically Signed   By: Roanna RaiderJeffery  Chang M.D.   On: 09/18/2016 01:03        Scheduled Meds: . chlorhexidine  15 mL Mouth Rinse BID  . Chlorhexidine Gluconate Cloth  6 each Topical Q0600  . mouth rinse  15 mL Mouth Rinse q12n4p  . mupirocin ointment  1 application Nasal BID    Continuous Infusions: . HYDROmorphone 0.5 mg/hr (09/18/16 1332)     LOS: 3 days    Time spent: 25 minutes    Katrinka BlazingAlex U Kadolph, MD [facility] Pager [phone]  If 7PM-7AM, please contact night-coverage [email or web address] Password [email or web address] 09/19/2016, 8:03 AM

## 2016-09-19 NOTE — Progress Notes (Signed)
Daily Progress Note   Patient Name: Alexis Sanders       Date: 09/19/2016 DOB: 01/20/1951  Age: 65 y.o. MRN#: [id number] Attending Physician: Filbert Schilder, MD Primary Care Physician: No primary care provider on file. Admit Date: 08/24/2016  Reason for Consultation/Follow-up: Terminal Care She has been admitted to the hospital since 08/19/2016 for sepsis secondary to UTI, being seen by ID, also diagnosed with MRSA bacteremia and also MRSA PNA. She has ongoing worsening acute kidney injury, also with elevated troponins, hypernatremia.   Subjective:  eyes, open, does not track or gaze meaningfully Appears comfortable On NRB mask Son Fayrene Fearing holding vigil at the bedside   Length of Stay: 3  Current Medications: Scheduled Meds:  . chlorhexidine  15 mL Mouth Rinse BID  . Chlorhexidine Gluconate Cloth  6 each Topical Q0600  . mouth rinse  15 mL Mouth Rinse q12n4p  . mupirocin ointment  1 application Nasal BID    Continuous Infusions: . HYDROmorphone 0.5 mg/hr (09/18/16 1332)    PRN Meds: acetaminophen **OR** acetaminophen, albuterol, flumazenil, haloperidol **OR** haloperidol **OR** haloperidol lactate, hydrALAZINE, HYDROmorphone **AND** HYDROmorphone, midazolam, morphine CONCENTRATE **OR** morphine CONCENTRATE, ondansetron **OR** ondansetron (ZOFRAN) IV  Physical Exam         NAD Shallow breathing S1 S2 Abdomen soft No coolness no mottling of extremities noted Essentially unresponsive  Vital Signs: BP (!) 124/51 (BP Location: Right Leg)   Pulse (!) 111   Temp 97.6 F (36.4 C) (Axillary)   Resp (!) 35   Ht 5\' 3"  (1.6 m)   Wt 64.3 kg (141 lb 12.1 oz)   SpO2 (!) 79%   BMI 25.11 kg/m  SpO2: SpO2: (!) 79 % O2 Device: O2 Device: NRB O2 Flow Rate: O2 Flow Rate (L/min):  15 L/min  Intake/output summary:  Intake/Output Summary (Last 24 hours) at 09/19/16 1549 Last data filed at 09/19/16 1100  Gross per 24 hour  Intake             4.48 ml  Output             1100 ml  Net         -1095.52 ml   LBM: Last BM Date: 09/18/16 Baseline Weight: Weight: 63 kg (139 lb) Most recent weight: Weight: 64.3 kg (141 lb 12.1 oz)  Palliative Assessment/Data:    Flowsheet Rows   Flowsheet Row Most Recent Value  Intake Tab  Referral Department  Hospitalist  Unit at Time of Referral  Intermediate Care Unit  Palliative Care Primary Diagnosis  Other (Comment)  Date Notified  09/17/16  Palliative Care Type  Return patient Palliative Care  Reason for referral  End of Life Care Assistance  Date of Admission  Jan 12, 2016  Date first seen by Palliative Care  09/18/16  # of days IP prior to Palliative referral  1  Clinical Assessment  Palliative Performance Scale Score  10%  Pain Max last 24 hours  6  Pain Min Last 24 hours  4  Dyspnea Max Last 24 Hours  5  Dyspnea Min Last 24 hours  4  Nausea Max Last 24 Hours  0  Nausea Min Last 24 Hours  0  Anxiety Max Last 24 Hours  4  Anxiety Min Last 24 Hours  3  Psychosocial & Spiritual Assessment  Palliative Care Outcomes  Patient/Family meeting held?  Yes  Who was at the meeting?  son Fayrene FearingJames May  Palliative Care Outcomes  Clarified goals of care      Patient Active Problem List   Diagnosis Date Noted  . Dehydration   . Healthcare-associated pneumonia   . MRSA bacteremia   . Hypernatremia   . Candidiasis   . Respiratory distress   . Sepsis (HCC) 06-23-16  . FUO (fever of unknown origin)   . Dysphagia   . Encounter for palliative care   . Goals of care, counseling/discussion   . Aspiration pneumonia of right lower lobe due to vomit (HCC)   . Acute respiratory failure with hypoxia (HCC) 08/20/2016  . SOB (shortness of breath)   . Acute encephalopathy 08/18/2016  . UTI (urinary tract infection) 08/18/2016    . Acute renal failure (HCC) 08/18/2016  . Hypothyroidism 08/18/2016  . Bipolar 1 disorder (HCC) 08/18/2016  . Parkinson's disease (HCC) 08/18/2016  . Diabetes mellitus type 2 in nonobese (HCC) 08/18/2016  . Acute renal failure superimposed on stage 3 chronic kidney disease (HCC) 08/18/2016  . Aspiration pneumonia (HCC) 08/18/2016  . Septic shock (HCC) 08/17/2016    Palliative Care Assessment & Plan   Patient Profile:    Assessment: She has been admitted to the hospital since 01-25-2016 for sepsis secondary to UTI, being seen by ID, also diagnosed with MRSA bacteremia and also MRSA PNA. She has ongoing worsening acute kidney injury, also with elevated troponins, hypernatremia.   Recommendations/Plan:   continue comfort measures, Dilaudid continuous infusion, also on PRN bolus dosages available, used 3 0.5 mg boluses since 7 am today  Anticipate hospital death, prognosis hours to days discussed with son.   Goals of Care and Additional Recommendations:  Limitations on Scope of Treatment: Full Comfort Care  Code Status:    Code Status Orders        Start     Ordered   09/18/16 1126  Do not attempt resuscitation (DNR)  Continuous    Question Answer Comment  In the event of cardiac or respiratory ARREST Do not call a "code blue"   In the event of cardiac or respiratory ARREST Do not perform Intubation, CPR, defibrillation or ACLS   In the event of cardiac or respiratory ARREST Use medication by any route, position, wound care, and other measures to relive pain and suffering. May use oxygen, suction and manual treatment of airway obstruction as needed for comfort.  09/18/16 1126    Code Status History    Date Active Date Inactive Code Status Order ID Comments User Context   09/07/2016 12:07 PM 09/18/2016 11:26 AM DNR [id number]  Haydee SalterPhillip M Hobbs, MD ED   08/18/2016 12:35 AM 09/04/2016  5:17 PM Full Code [id number]  Eduard ClosArshad N Kakrakandy, MD ED       Prognosis:   Hours -  Days  Discharge Planning:  Anticipated Hospital Death  Care plan was discussed with  Patient's son Fayrene FearingJames holding vigil at the bedside    Thank you for allowing the Palliative Medicine Team to assist in the care of this patient.   Time In: 1300 Time Out: 1325 Total Time 25 Prolonged Time Billed  no       Greater than 50%  of this time was spent counseling and coordinating care related to the above assessment and plan.  Rosalin HawkingZeba Miski Feldpausch, MD [phone]  Please contact Palliative Medicine Team phone at [phone] for questions and concerns.

## 2016-09-19 NOTE — Progress Notes (Signed)
Patient ID: Alexis Sanders, female   DOB: 09/25/1951, 65 y.o.   MRN: [id number] Will not follow formally at this time with comfort care only goal.  Will be available if needed

## 2016-09-20 DIAGNOSIS — E860 Dehydration: Secondary | ICD-10-CM

## 2016-09-20 DIAGNOSIS — A4102 Sepsis due to Methicillin resistant Staphylococcus aureus: Principal | ICD-10-CM

## 2016-09-20 NOTE — Progress Notes (Addendum)
Pt appears to have increased work of breathing with minimal relief from dilaudid boluses. MD paged and ordered dilaudid to be titrated up by 0.5mg /hr every 2 hours prn for pain and or dyspnea for a max continuous dose of 10mg /hr.

## 2016-09-20 NOTE — Progress Notes (Signed)
PROGRESS NOTE    Alexis Sanders  ZOX:[id number] DOB: Aug 03, 1951 DOA: 2016/09/26 PCP: No primary care provider on file.    Brief Narrative:  Alexis Sanders is a 65 y.o. female passable history significant for diabetes, low thyroid, Parkinson's, bipolar presents emergency room with chief complaint of altered mental status per her nursing facility. Patient was recently discharged from [facility] diagnosis of pneumonia. Patient has been skilled rehabilitation. Patient's husband states that she was recently spaced restarted on Klonopin, not completely certain. Feels that patient began to have decreased alertness after this about 2 days after discharge. He states after gradual decrease patient did not worsen. He questioned nursing home staff about this but never  Got an answer. The day of admission patient was sent to emergency room for evaluation for being less alert.   ED course: Started on sepsis protocol. Zosyn. Blood cultures drawn. Urine sent for culture. Hospitalist ordered flumazenil and patient responded well.  However, further deteriorated and her oxygen requirement increased during her hospitalization.  Her son and husband were notified and decision to make patient comfort care was made.  Patient transitioned to sole comfort care.  Hospital death anticipated.   Assessment & Plan:   Principal Problem:   Sepsis (HCC) Active Problems:   Acute encephalopathy   UTI (urinary tract infection)   Hypothyroidism   Bipolar 1 disorder (HCC)   Parkinson's disease (HCC)   Diabetes mellitus type 2 in nonobese (HCC)   Acute renal failure superimposed on stage 3 chronic kidney disease (HCC)   Dehydration   Healthcare-associated pneumonia   MRSA bacteremia   Hypernatremia   Candidiasis   Respiratory distress   Sepsis 2/2 UTI Given Vanc and zosyn in the emergency room, will continue vanc and switch zosyn to maxipime Antibiotics stopped per family wishes that patient not attached to  lines Anticipate hospital death  Acidemia secondary to metabolic acidosis Labs canceled  AKI Baseline Cr 1.4, Cr on admit 6.45 4L of normal saline given in the emergency room Nephro consulted but patient transitioned to full comfort care Labs canceled  Troponemia Flattened  Candiuria Not clinically significant per ID  Hypernatremia Family requested all lines be discontinued  Hypertension urgency No longer monitoring BP  Altered mental status Likely multifactorial Patient with sepsis and hyponatremia Comfort Care  Code Status: DNR/ comfort care DVT Prophylaxis: Heparin Family Communication: Husband at bedside Disposition Plan: anticipate hospital death    Consultants:   Nephrology  Infectious Disease  Palliative Care  Procedures:   None  Antimicrobials:   Vancomycin 11/29> 12/1  Zosyn 11/29> 12/1  Cefepime 11/29> 12/1  Diflucan 11/29> 12/1   Subjective: Patient slightly awake- not interactive.  Looks comfortable, eyes do not track.  When examined patient did seem to become more anxious and started tossing and turning in the bed.  Asked patient if she wanted anything to eat or drink but she did not respond. Objective: Vitals:   09/20/16 0110 09/20/16 0120 09/20/16 0130 09/20/16 0140  BP:      Pulse: 99 (!) 106 99 95  Resp: (!) 24 20 (!) 32 (!) 21  Temp:      TempSrc:      SpO2: (!) 81% (!) 81% 90% (!) 87%  Weight:      Height:        Intake/Output Summary (Last 24 hours) at 09/20/16 0826 Last data filed at 09/19/16 1800  Gross per 24 hour  Intake  23.98 ml  Output              150 ml  Net          -126.02 ml   Filed Weights   13-Oct-2016 0706 09/17/16 0332 09/18/16 0500  Weight: 63 kg (139 lb) 67.2 kg (148 lb 2.4 oz) 64.3 kg (141 lb 12.1 oz)    Examination:  General exam: Appears calm and comfortable  Respiratory system: Crackles in middle and lower lung fields. Increased respiratory effort, on 6L  oxymask Cardiovascular system: S1 & S2 heard, RRR. No JVD, murmurs, rubs, gallops or clicks. No pedal edema. Gastrointestinal system: Abdomen is nondistended, soft and nontender. No organomegaly or masses felt. Hypoactive bowel sounds heard. Central nervous system: not alert Extremities: did not follow commands Skin: No rashes, lesions or ulcers Psychiatry: could not assess    Data Reviewed: I have personally reviewed following labs and imaging studies  CBC:  Recent Labs Lab 2016/10/13 0705 09/17/16 0319 09/18/16 0544  WBC 20.1* 11.2* 9.4  NEUTROABS 17.3*  --   --   HGB 13.6 9.7* 9.7*  HCT 44.6 32.5* 32.3*  MCV 90.7 89.8 90.2  PLT 145* 62* 53*   Basic Metabolic Panel:  Recent Labs Lab 13-Oct-2016 1043  09/17/16 0319 09/17/16 1617 09/18/16 0044 09/18/16 0544 09/19/16 0406  NA 161*  < > 159* 161* 159* 159* 163*  K 4.8  < > 4.1 4.4 4.4 4.8 5.1  CL >130*  < > >130* >130* >130* >130* >130*  CO2 20*  < > 19* 18* 17* 17* 14*  GLUCOSE 210*  < > 127* 110* 146* 219* 228*  BUN 114*  < > 101* 90* 84* 84* 93*  CREATININE 5.17*  < > 4.80* 4.43* 4.35* 4.32* 4.79*  CALCIUM 7.5*  < > 8.4* 8.9 9.0 9.1 8.8*  MG 2.8*  --   --   --   --   --   --   PHOS 5.0*  --  4.1  --   --  4.8* 6.3*  < > = values in this interval not displayed. GFR: Estimated Creatinine Clearance: 10.6 mL/min (by C-G formula based on SCr of 4.79 mg/dL (H)). Liver Function Tests:  Recent Labs Lab 2016-10-13 0705 09/17/16 0319 09/18/16 0544 09/19/16 0406  AST 74*  --   --   --   ALT 98*  --   --   --   ALKPHOS 169*  --   --   --   BILITOT 0.7  --   --   --   PROT 7.0  --   --   --   ALBUMIN 3.6 2.4* 2.5* 2.1*   No results for input(s): LIPASE, AMYLASE in the last 168 hours. No results for input(s): AMMONIA in the last 168 hours. Coagulation Profile:  Recent Labs Lab 10-13-16 0705  INR 1.37   Cardiac Enzymes:  Recent Labs Lab 10/13/2016 0705 13-Oct-2016 1043 Oct 13, 2016 1715 2016-10-13 1802  CKTOTAL  --    --  58  --   TROPONINI 0.05* 0.05*  --  0.06*   BNP (last 3 results) No results for input(s): PROBNP in the last 8760 hours. HbA1C: No results for input(s): HGBA1C in the last 72 hours. CBG:  Recent Labs Lab 09/17/16 1748 09/17/16 2013 09/17/16 2311 09/18/16 0435 09/18/16 0752  GLUCAP 106* 105* 119* 183* 147*   Lipid Profile: No results for input(s): CHOL, HDL, LDLCALC, TRIG, CHOLHDL, LDLDIRECT in the last 72 hours. Thyroid Function Tests: No  results for input(s): TSH, T4TOTAL, FREET4, T3FREE, THYROIDAB in the last 72 hours. Anemia Panel: No results for input(s): VITAMINB12, FOLATE, FERRITIN, TIBC, IRON, RETICCTPCT in the last 72 hours. Sepsis Labs:  Recent Labs Lab 10/26/15 1043 10/26/15 1715 10/26/15 2314 09/17/16 0319  LATICACIDVEN 1.9 2.2* 2.6* 1.5    Recent Results (from the past 240 hour(s))  Blood Culture (routine x 2)     Status: Abnormal   Collection Time: 10/26/15  7:15 AM  Result Value Ref Range Status   Specimen Description BLOOD RIGHT HAND  Final   Special Requests BOTTLES DRAWN AEROBIC AND ANAEROBIC  5CC  Final   Culture  Setup Time   Final    GRAM POSITIVE COCCI IN CLUSTERS IN BOTH AEROBIC AND ANAEROBIC BOTTLES CRITICAL RESULT CALLED TO, READ BACK BY AND VERIFIED WITHChristoper Fabian: CARON AMEND, PHARMD @ 210 [phone] 09/17/16 MKELLY,MLT    Culture METHICILLIN RESISTANT STAPHYLOCOCCUS AUREUS (A)  Final   Report Status 09/19/2016 FINAL  Final   Organism ID, Bacteria METHICILLIN RESISTANT STAPHYLOCOCCUS AUREUS  Final      Susceptibility   Methicillin resistant staphylococcus aureus - MIC*    CIPROFLOXACIN >=8 RESISTANT Resistant     ERYTHROMYCIN >=8 RESISTANT Resistant     GENTAMICIN <=0.5 SENSITIVE Sensitive     OXACILLIN >=4 RESISTANT Resistant     TETRACYCLINE <=1 SENSITIVE Sensitive     VANCOMYCIN 1 SENSITIVE Sensitive     TRIMETH/SULFA <=10 SENSITIVE Sensitive     CLINDAMYCIN <=0.25 SENSITIVE Sensitive     RIFAMPIN <=0.5 SENSITIVE Sensitive     Inducible  Clindamycin NEGATIVE Sensitive     * METHICILLIN RESISTANT STAPHYLOCOCCUS AUREUS  Blood Culture ID Panel (Reflexed)     Status: Abnormal   Collection Time: 10/26/15  7:15 AM  Result Value Ref Range Status   Enterococcus species NOT DETECTED NOT DETECTED Final   Listeria monocytogenes NOT DETECTED NOT DETECTED Final   Staphylococcus species DETECTED (A) NOT DETECTED Final    Comment: CRITICAL RESULT CALLED TO, READ BACK BY AND VERIFIED WITH: CARON AMEND,PHARMD @0554  09/17/16 MKELLY,MLT    Staphylococcus aureus DETECTED (A) NOT DETECTED Final    Comment: CRITICAL RESULT CALLED TO, READ BACK BY AND VERIFIED WITH: CARON AMEND,PHARMD @0554  09/17/16 MKELLY,MLT    Methicillin resistance DETECTED (A) NOT DETECTED Final    Comment: CRITICAL RESULT CALLED TO, READ BACK BY AND VERIFIED WITH: CARON AMEND,PHARMD @0554  09/17/16 MKELLY,MLT    Streptococcus species NOT DETECTED NOT DETECTED Final   Streptococcus agalactiae NOT DETECTED NOT DETECTED Final   Streptococcus pneumoniae NOT DETECTED NOT DETECTED Final   Streptococcus pyogenes NOT DETECTED NOT DETECTED Final   Acinetobacter baumannii NOT DETECTED NOT DETECTED Final   Enterobacteriaceae species NOT DETECTED NOT DETECTED Final   Enterobacter cloacae complex NOT DETECTED NOT DETECTED Final   Escherichia coli NOT DETECTED NOT DETECTED Final   Klebsiella oxytoca NOT DETECTED NOT DETECTED Final   Klebsiella pneumoniae NOT DETECTED NOT DETECTED Final   Proteus species NOT DETECTED NOT DETECTED Final   Serratia marcescens NOT DETECTED NOT DETECTED Final   Haemophilus influenzae NOT DETECTED NOT DETECTED Final   Neisseria meningitidis NOT DETECTED NOT DETECTED Final   Pseudomonas aeruginosa NOT DETECTED NOT DETECTED Final   Candida albicans NOT DETECTED NOT DETECTED Final   Candida glabrata NOT DETECTED NOT DETECTED Final   Candida krusei NOT DETECTED NOT DETECTED Final   Candida parapsilosis NOT DETECTED NOT DETECTED Final   Candida  tropicalis NOT DETECTED NOT DETECTED Final  Blood Culture (routine x 2)  Status: None (Preliminary result)   Collection Time: 09/17/2016  7:27 AM  Result Value Ref Range Status   Specimen Description BLOOD RIGHT WRIST  Final   Special Requests BOTTLES DRAWN AEROBIC AND ANAEROBIC  5CC  Final   Culture NO GROWTH 3 DAYS  Final   Report Status PENDING  Incomplete  Urine culture     Status: None   Collection Time: 09/08/2016  7:42 AM  Result Value Ref Range Status   Specimen Description URINE, CATHETERIZED  Final   Special Requests NONE  Final   Culture NO GROWTH  Final   Report Status 09/17/2016 FINAL  Final  MRSA PCR Screening     Status: Abnormal   Collection Time: 09/15/2016  8:11 PM  Result Value Ref Range Status   MRSA by PCR POSITIVE (A) NEGATIVE Final    Comment:        The GeneXpert MRSA Assay (FDA approved for NASAL specimens only), is one component of a comprehensive MRSA colonization surveillance program. It is not intended to diagnose MRSA infection nor to guide or monitor treatment for MRSA infections. RESULT CALLED TO, READ BACK BY AND VERIFIED WITH: M.YORK,RN AT 0102 09/17/16 BY L.PITT          Radiology Studies: No results found.      Scheduled Meds: . chlorhexidine  15 mL Mouth Rinse BID  . Chlorhexidine Gluconate Cloth  6 each Topical Q0600  . mouth rinse  15 mL Mouth Rinse q12n4p  . mupirocin ointment  1 application Nasal BID   Continuous Infusions: . HYDROmorphone 0.5 mg/hr (09/20/16 0246)     LOS: 4 days    Time spent: 30 minutes    Alexis BlazingAlex U Kadolph, MD [facility] Pager [phone]  If 7PM-7AM, please contact night-coverage [email or web address] Password [email or web address] 09/20/2016, 8:26 AM

## 2016-09-21 DIAGNOSIS — J189 Pneumonia, unspecified organism: Secondary | ICD-10-CM

## 2016-09-21 LAB — CULTURE, BLOOD (ROUTINE X 2): CULTURE: NO GROWTH

## 2016-10-19 NOTE — Progress Notes (Signed)
Wasted remaining Dilaudid drip with Tereasa CoopHannah Mills, RN in the room estimated 10mls.  Pronounced death with Tereasa CoopHannah Mills, RN at 1050. Patient passed away peacefully.  Spoke with son on the phone and he reports that he is nearby. Will notify him in person once he is here. Paged MD to notify.

## 2016-10-19 NOTE — Discharge Summary (Signed)
Death Summary  Alexis Sanders AVW:[id number] DOB: 12/23/1950 DOA: 10/17/16  PCP: No primary care provider on file.  Admit date: 10/17/16 Date of Death: 09/19/2016 Time of Death: 10:50 Notification: No primary care provider on file. notified of death of 10/08/2016   History of present illness:  Alexis Sanders is a 66 y.o. female with a history of diabetes, hypothyroidism, Parkinson's, Bipolar disorder. Alexis Sanders presented with complaint of altered mental status from a SNF.  Alexis Sanders did not improve after being found to be septic and started on sepsis protocol. She was started on Zosyn. Blood cultures drawn and ultimately came back positive for MRSA bacteremia.  She was found to be severely volume depleted and hypernatremic and as such nephrology was consulted.  Infectious Disease was consulted for management of antibiotics for her MRSA bacteremia. Urine sent for culture. Hospitalist ordered flumazenil and patient responded well. However, even with treatments of the hypernatremic, HCAP, and bacteremia patient further deteriorated and her oxygen requirement increased during her hospitalization.  Her son and husband both requested patient to be made comfortable and she was transitioned to comfort care only.  Final Diagnoses:  1.   Respiratory Failure 2. MRSA Bacteremia 3. Kidney Failure 4. Hypernatremia   The results of significant diagnostics from this hospitalization (including imaging, microbiology, ancillary and laboratory) are listed below for reference.    Significant Diagnostic Studies: Ct Abdomen Pelvis Wo Contrast  Result Date: 08/27/2016 CLINICAL DATA:  FUO, 66 y.o. female with past medical history of parkinson's disease started on amantadine August 2017, DM2, Bipolar disorder, hypothyroidism who was [facility] with confusion, tremors for 3-4 days prior to th.*comment was truncated* EXAM: CT CHEST, ABDOMEN, AND PELVIS WITHOUT  CONTRAST TECHNIQUE:  Multidetector CT imaging of the chest, abdomen and pelvis was performed without administration of intravenous contrast. CONTRAST:  100 mL Isovue COMPARISON:  None. FINDINGS: CT CHEST FINDINGS Cardiovascular: No significant vascular calcifications. No pericardial fluid. Mediastinum/Nodes: No axillary supraclavicular adenopathy. No mediastinal hilar adenopathy. Feeding tube extends in the esophagus. Lungs/Pleura: There is bilateral patchy airspace disease. Underline ground-glass opacities. Mild consolidation the LEFT lower lobe. No discrete nodularity. Musculoskeletal: No aggressive osseous lesion. CT ABDOMEN AND PELVIS FINDINGS Hepatobiliary: No focal hepatic lesions noncontrast exam. Post cholecystectomy. Pancreas: Pancreas is normal. No ductal dilatation. No pancreatic inflammation. Spleen: Normal spleen Adrenals/urinary tract: Adrenal glands normal. Kidneys, ureters bladder normal. Stomach/Bowel: Feeding tube extends into the duodenum. Small bowel chronic appendix and colon are normal. Vascular/Lymphatic: Abdominal aorta is normal caliber with atherosclerotic calcification. There is no retroperitoneal or periportal lymphadenopathy. No pelvic lymphadenopathy. Reproductive: Post hysterectomy Other: No free fluid. Musculoskeletal: No aggressive osseous lesion. IMPRESSION: Chest Impression: 1. Bilateral patchy airspace densities suggests multi pulmonary infection versus drug reaction. 2. Ground-glass opacities suggests underlying pulmonary edema. 3. Small bilateral pleural effusions. Abdomen / Pelvis Impression: 1. No evidence infection in the abdomen or pelvis. 2. Feeding tube with tip in the duodenum. 3. No bowel obstruction. Electronically Signed   By: Genevive BiStewart  Edmunds M.D.   On: 08/27/2016 16:08   Dg Chest 1 View  Result Date: 09/17/2016 CLINICAL DATA:  Respiratory distress . EXAM: CHEST 1 VIEW COMPARISON:  07/08/2016 . FINDINGS: Mild cardiomegaly with diffuse bilateral pulmonary infiltrates suggesting  congestive heart failure with bilateral pulmonary edema. Bilateral pneumonia cannot be excluded. No pleural effusion or pneumothorax. IMPRESSION: Cardiomegaly with diffuse bilateral pulmonary infiltrates. Findings suggest congestive heart failure with bilateral pulmonary edema. Bilateral pneumonia cannot be excluded . Electronically Signed  ByMaisie Fus  Register   On: 09/17/2016 10:22   Ct Head Wo Contrast  Result Date: Sep 24, 2016 CLINICAL DATA:  Acute encephalopathy. EXAM: CT HEAD WITHOUT CONTRAST TECHNIQUE: Contiguous axial images were obtained from the base of the skull through the vertex without intravenous contrast. COMPARISON:  CT scan of August 18, 2016. FINDINGS: Brain: Mild diffuse cortical atrophy is noted. No mass effect or midline shift is noted. Ventricular size is within normal limits. There is no evidence of mass lesion, hemorrhage or acute infarction. Vascular: Atherosclerosis of carotid siphons is noted. Skull: Normal. Negative for fracture or focal lesion. Sinuses/Orbits: No acute finding. Other: None. IMPRESSION: Mild diffuse cortical atrophy. No acute intracranial abnormality seen. Electronically Signed   By: Lupita Raider, M.D.   On: 09-24-16 12:47   Ct Chest Wo Contrast  Result Date: 08/27/2016 CLINICAL DATA:  FUO, 66 y.o. female with past medical history of parkinson's disease started on amantadine August 2017, DM2, Bipolar disorder, hypothyroidism who was brought to [facility] with confusion, tremors for 3-4 days prior to th.*comment was truncated* EXAM: CT CHEST, ABDOMEN, AND PELVIS WITHOUT  CONTRAST TECHNIQUE: Multidetector CT imaging of the chest, abdomen and pelvis was performed without administration of intravenous contrast. CONTRAST:  100 mL Isovue COMPARISON:  None. FINDINGS: CT CHEST FINDINGS Cardiovascular: No significant vascular calcifications. No pericardial fluid. Mediastinum/Nodes: No axillary supraclavicular adenopathy. No mediastinal hilar adenopathy.  Feeding tube extends in the esophagus. Lungs/Pleura: There is bilateral patchy airspace disease. Underline ground-glass opacities. Mild consolidation the LEFT lower lobe. No discrete nodularity. Musculoskeletal: No aggressive osseous lesion. CT ABDOMEN AND PELVIS FINDINGS Hepatobiliary: No focal hepatic lesions noncontrast exam. Post cholecystectomy. Pancreas: Pancreas is normal. No ductal dilatation. No pancreatic inflammation. Spleen: Normal spleen Adrenals/urinary tract: Adrenal glands normal. Kidneys, ureters bladder normal. Stomach/Bowel: Feeding tube extends into the duodenum. Small bowel chronic appendix and colon are normal. Vascular/Lymphatic: Abdominal aorta is normal caliber with atherosclerotic calcification. There is no retroperitoneal or periportal lymphadenopathy. No pelvic lymphadenopathy. Reproductive: Post hysterectomy Other: No free fluid. Musculoskeletal: No aggressive osseous lesion. IMPRESSION: Chest Impression: 1. Bilateral patchy airspace densities suggests multi pulmonary infection versus drug reaction. 2. Ground-glass opacities suggests underlying pulmonary edema. 3. Small bilateral pleural effusions. Abdomen / Pelvis Impression: 1. No evidence infection in the abdomen or pelvis. 2. Feeding tube with tip in the duodenum. 3. No bowel obstruction. Electronically Signed   By: Genevive Bi M.D.   On: 08/27/2016 16:08   US Renal  Result Date: 09/17/2016 CLINICAL DATA:  Acute onset of renal insufficiency. Initial encounter. EXAM: RENAL / URINARY TRACT ULTRASOUND COMPLETE COMPARISON:  CT of the abdomen and pelvis performed 08/27/2016 FINDINGS: Right Kidney: Length: 8.9 cm. Diffusely increased parenchymal echogenicity and cortical thinning may reflect a combination of chronic renal disease and medical renal disease. No mass or hydronephrosis visualized. Left Kidney: Length: 8.7 cm. Diffusely increased parenchymal echogenicity and cortical thinning may reflect a combination of chronic renal  disease and medical renal disease. No mass or hydronephrosis visualized. Bladder: Mildly distended, with a Foley catheter in place. IMPRESSION: 1. No evidence of hydronephrosis. 2. Diffusely increased renal parenchymal echogenicity likely reflects medical renal disease. 3. Bilateral renal cortical thinning is concerning for chronic renal disease. Electronically Signed   By: Roanna Raider M.D.   On: 09/17/2016 07:06   Dg Chest Port 1 View  Result Date: 09/18/2016 CLINICAL DATA:  Acute onset of shortness of breath. Initial encounter. EXAM: PORTABLE CHEST 1 VIEW COMPARISON:  Chest radiograph performed 09/17/2016 FINDINGS: Improving  bilateral airspace opacification may reflect pulmonary edema or pneumonia, still worse on the right. No pleural effusion or pneumothorax is seen. The cardiomediastinal silhouette is normal in size. No acute osseous abnormalities are identified. IMPRESSION: Bilateral airspace opacification is improved from the prior study, and may reflect residual pulmonary edema or pneumonia. Electronically Signed   By: Roanna RaiderJeffery  Chang M.D.   On: 09/18/2016 01:03   Dg Chest Port 1 View  Result Date: 09/11/2016 CLINICAL DATA:  Shaking.  Fever. EXAM: PORTABLE CHEST 1 VIEW COMPARISON:  CT 08/27/2016. FINDINGS: Mediastinum and hilar structures are normal. Low lung volumes. Mild bibasilar pulmonary infiltrates. Heart size stable. Pulmonary vascularity normal . No pleural effusion or pneumothorax . IMPRESSION: Low lung volumes.  Mild bibasilar pulmonary infiltrates. Electronically Signed   By: Maisie Fushomas  Register   On: 08/26/2016 07:44   Dg Chest Port 1 View  Result Date: 08/26/2016 CLINICAL DATA:  Fever for unknown origin. EXAM: PORTABLE CHEST 1 VIEW COMPARISON:  08/23/2016 FINDINGS: Feeding catheter tip is collimated off the image, likely transpyloric. Cardiomediastinal silhouette is normal. Mediastinal contours appear intact. There is no evidence of pleural effusion or pneumothorax. Mild peribronchial  airspace opacity in the left lower thorax are stable. There is mild pulmonary vascular congestion. Osseous structures are without acute abnormality. Soft tissues are grossly normal. Cholecystectomy clips noted. IMPRESSION: Mild persistent peribronchial airspace opacities in the left lower thorax. Mild pulmonary vascular congestion. Electronically Signed   By: Ted Mcalpineobrinka  Dimitrova M.D.   On: 08/26/2016 13:06   Dg Chest Port 1 View  Result Date: 08/23/2016 CLINICAL DATA:  Acute respiratory failure, diabetes mellitus, Parkinson's EXAM: PORTABLE CHEST 1 VIEW COMPARISON:  Portable exam 0408 hours compared to 08/22/2016 FINDINGS: Tip of endotracheal tube projects 2.8 cm above carina. Feeding tube extends through stomach. LEFT jugular central venous catheter with tip projecting over SVC. Normal heart size, mediastinal contours, and pulmonary vascularity. Slightly improved RIGHT basilar infiltrate. Mild retrocardiac LEFT lower lobe infiltrate suspected, new. Upper lungs clear. No pleural effusion or pneumothorax. IMPRESSION: Improved RIGHT basilar infiltrate with new mild retrocardiac LEFT lower lobe infiltrate. Electronically Signed   By: Ulyses SouthwardMark  Boles M.D.   On: 08/23/2016 07:14   Dg Swallowing Func-speech Pathology  Result Date: 08/28/2016 Objective Swallowing Evaluation: Type of Study: MBS-Modified Barium Swallow Study Patient Details Name: Alexis Sanders MRN: [id number] Date of Birth: 10/07/1951 Today's Date: 08/28/2016 Time: SLP Start Time (ACUTE ONLY): 1027-SLP Stop Time (ACUTE ONLY): 1050 SLP Time Calculation (min) (ACUTE ONLY): 23 min Past Medical History: Past Medical History: Diagnosis Date . Diabetes mellitus without complication (HCC)  . Hypothyroidism  . Parkinson disease [facility](HCC)  Past Surgical History: Past Surgical History: Procedure Laterality Date . ABDOMINAL HYSTERECTOMY   . CHOLECYSTECTOMY   . TONSILLECTOMY   HPI: 66 year old female with a history of DM 2, hypothyroidism, CKD, bipolar disorder, and  parkinsonism presentedwith 3-4 day history of increasing confusion and tremors. The patient was [facility] at [facility] on 08/16/2016. She was discharged from the emergency department with prescriptions for levofloxacin, promethazine, and albuterol. Unfortunately, the patient's symptoms persisted despite taking levofloxacin. As a result, the patient was [facility]. Patient has had a nonproductive cough but no vomiting, diarrhea, dysuria. History was obtained from the patient's husband as the patient was encephalopathic. Workup in the emergency department revealed significant pyuria and chest x-ray showing bilateral consolidations, right greater than left.  Intubated 11/3-11/5.  Subjective: pt alert, soft spoken Assessment / Plan / Recommendation CHL IP CLINICAL  IMPRESSIONS 08/28/2016 Therapy Diagnosis Mild oral phase dysphagia;Mild pharyngeal phase dysphagia Clinical Impression Pt presents with a mild oropharyngeal dysphagia similar in presentation to recent [facility] 08/20/16. Lingual and labial tremors result in reduced bolus cohesion and delayed oral transit of purees with mild lingual residue after the swallow. A straw facilitates oral control. She has a consistent delay in swallow initiation, which when paired with decreased airway closure, results in mild but silent penetration with thin liquids that cannot be cleared despite cued coughing. Trials were kept limited due to pt reports of nausea and with dry heaving. Recommend Dys 1 diet and nectar thick liquids with possible potential for advancement with improvements in overall strength and respiratory function. Pt may be a good candidate for respiratory muscle strength training. Impact on safety and function Mild aspiration risk;Moderate aspiration risk   CHL IP TREATMENT RECOMMENDATION 08/28/2016 Treatment Recommendations Therapy as outlined in treatment plan below   Prognosis 08/28/2016 Prognosis for Safe Diet Advancement  Good Barriers to Reach Goals Cognitive deficits Barriers/Prognosis Comment -- CHL IP DIET RECOMMENDATION 08/28/2016 SLP Diet Recommendations Dysphagia 1 (Puree) solids;Nectar thick liquid Liquid Administration via Straw Medication Administration Crushed with puree Compensations Slow rate;Small sips/bites Postural Changes Remain semi-upright after after feeds/meals (Comment);Seated upright at 90 degrees   CHL IP OTHER RECOMMENDATIONS 08/28/2016 Recommended Consults -- Oral Care Recommendations Oral care BID Other Recommendations Order thickener from pharmacy;Prohibited food (jello, ice cream, thin soups);Remove water pitcher   CHL IP FOLLOW UP RECOMMENDATIONS 08/28/2016 Follow up Recommendations Skilled Nursing facility   [facility] IP FREQUENCY AND DURATION 08/28/2016 Speech Therapy Frequency (ACUTE ONLY) min 2x/week Treatment Duration 2 weeks      CHL IP ORAL PHASE 08/28/2016 Oral Phase Impaired Oral - Pudding Teaspoon -- Oral - Pudding Cup -- Oral - Honey Teaspoon -- Oral - Honey Cup -- Oral - Nectar Teaspoon Decreased bolus cohesion Oral - Nectar Cup -- Oral - Nectar Straw Decreased bolus cohesion Oral - Thin Teaspoon Decreased bolus cohesion Oral - Thin Cup -- Oral - Thin Straw Decreased bolus cohesion Oral - Puree Weak lingual manipulation;Delayed oral transit;Decreased bolus cohesion;Lingual/palatal residue Oral - Mech Soft NT Oral - Regular -- Oral - Multi-Consistency -- Oral - Pill -- Oral Phase - Comment --  CHL IP PHARYNGEAL PHASE 08/28/2016 Pharyngeal Phase Impaired Pharyngeal- Pudding Teaspoon -- Pharyngeal -- Pharyngeal- Pudding Cup -- Pharyngeal -- Pharyngeal- Honey Teaspoon -- Pharyngeal -- Pharyngeal- Honey Cup -- Pharyngeal -- Pharyngeal- Nectar Teaspoon Delayed swallow initiation-vallecula;Reduced tongue base retraction;Pharyngeal residue - valleculae Pharyngeal -- Pharyngeal- Nectar Cup -- Pharyngeal -- Pharyngeal- Nectar Straw Delayed swallow initiation-vallecula;Reduced tongue base  retraction;Pharyngeal residue - valleculae Pharyngeal -- Pharyngeal- Thin Teaspoon Delayed swallow initiation-vallecula;Reduced tongue base retraction;Pharyngeal residue - valleculae Pharyngeal -- Pharyngeal- Thin Cup -- Pharyngeal -- Pharyngeal- Thin Straw Reduced tongue base retraction;Pharyngeal residue - valleculae;Delayed swallow initiation-pyriform sinuses;Penetration/Aspiration before swallow Pharyngeal Material enters airway, remains ABOVE vocal cords and not ejected out Pharyngeal- Puree Delayed swallow initiation-vallecula;Reduced tongue base retraction;Pharyngeal residue - valleculae Pharyngeal -- Pharyngeal- Mechanical Soft NT Pharyngeal -- Pharyngeal- Regular -- Pharyngeal -- Pharyngeal- Multi-consistency -- Pharyngeal -- Pharyngeal- Pill -- Pharyngeal -- Pharyngeal Comment --  CHL IP CERVICAL ESOPHAGEAL PHASE 08/28/2016 Cervical Esophageal Phase WFL Pudding Teaspoon -- Pudding Cup -- Honey Teaspoon -- Honey Cup -- Nectar Teaspoon -- Nectar Cup -- Nectar Straw -- Thin Teaspoon -- Thin Cup -- Thin Straw -- Puree -- Mechanical Soft -- Regular -- Multi-consistency -- Pill -- Cervical Esophageal Comment -- No flowsheet data found. Maxcine Ham 08/28/2016, 1:36 PM  Maxcine Ham, M.A. CCC-SLP [phone]               Microbiology: Recent Results (from the past 240 hour(s))  Blood Culture (routine x 2)     Status: Abnormal   Collection Time: 10-07-16  7:15 AM  Result Value Ref Range Status   Specimen Description BLOOD RIGHT HAND  Final   Special Requests BOTTLES DRAWN AEROBIC AND ANAEROBIC  5CC  Final   Culture  Setup Time   Final    GRAM POSITIVE COCCI IN CLUSTERS IN BOTH AEROBIC AND ANAEROBIC BOTTLES CRITICAL RESULT CALLED TO, READ BACK BY AND VERIFIED WITHChristoper Fabian, PHARMD @ [phone] 09/17/16 MKELLY,MLT    Culture METHICILLIN RESISTANT STAPHYLOCOCCUS AUREUS (A)  Final   Report Status 09/19/2016 FINAL  Final   Organism ID, Bacteria METHICILLIN RESISTANT STAPHYLOCOCCUS AUREUS   Final      Susceptibility   Methicillin resistant staphylococcus aureus - MIC*    CIPROFLOXACIN >=8 RESISTANT Resistant     ERYTHROMYCIN >=8 RESISTANT Resistant     GENTAMICIN <=0.5 SENSITIVE Sensitive     OXACILLIN >=4 RESISTANT Resistant     TETRACYCLINE <=1 SENSITIVE Sensitive     VANCOMYCIN 1 SENSITIVE Sensitive     TRIMETH/SULFA <=10 SENSITIVE Sensitive     CLINDAMYCIN <=0.25 SENSITIVE Sensitive     RIFAMPIN <=0.5 SENSITIVE Sensitive     Inducible Clindamycin NEGATIVE Sensitive     * METHICILLIN RESISTANT STAPHYLOCOCCUS AUREUS  Blood Culture ID Panel (Reflexed)     Status: Abnormal   Collection Time: 07-Oct-2016  7:15 AM  Result Value Ref Range Status   Enterococcus species NOT DETECTED NOT DETECTED Final   Listeria monocytogenes NOT DETECTED NOT DETECTED Final   Staphylococcus species DETECTED (A) NOT DETECTED Final    Comment: CRITICAL RESULT CALLED TO, READ BACK BY AND VERIFIED WITH: CARON AMEND,PHARMD @0554  09/17/16 MKELLY,MLT    Staphylococcus aureus DETECTED (A) NOT DETECTED Final    Comment: CRITICAL RESULT CALLED TO, READ BACK BY AND VERIFIED WITH: CARON AMEND,PHARMD @0554  09/17/16 MKELLY,MLT    Methicillin resistance DETECTED (A) NOT DETECTED Final    Comment: CRITICAL RESULT CALLED TO, READ BACK BY AND VERIFIED WITH: CARON AMEND,PHARMD @0554  09/17/16 MKELLY,MLT    Streptococcus species NOT DETECTED NOT DETECTED Final   Streptococcus agalactiae NOT DETECTED NOT DETECTED Final   Streptococcus pneumoniae NOT DETECTED NOT DETECTED Final   Streptococcus pyogenes NOT DETECTED NOT DETECTED Final   Acinetobacter baumannii NOT DETECTED NOT DETECTED Final   Enterobacteriaceae species NOT DETECTED NOT DETECTED Final   Enterobacter cloacae complex NOT DETECTED NOT DETECTED Final   Escherichia coli NOT DETECTED NOT DETECTED Final   Klebsiella oxytoca NOT DETECTED NOT DETECTED Final   Klebsiella pneumoniae NOT DETECTED NOT DETECTED Final   Proteus species NOT DETECTED NOT  DETECTED Final   Serratia marcescens NOT DETECTED NOT DETECTED Final   Haemophilus influenzae NOT DETECTED NOT DETECTED Final   Neisseria meningitidis NOT DETECTED NOT DETECTED Final   Pseudomonas aeruginosa NOT DETECTED NOT DETECTED Final   Candida albicans NOT DETECTED NOT DETECTED Final   Candida glabrata NOT DETECTED NOT DETECTED Final   Candida krusei NOT DETECTED NOT DETECTED Final   Candida parapsilosis NOT DETECTED NOT DETECTED Final   Candida tropicalis NOT DETECTED NOT DETECTED Final  Blood Culture (routine x 2)     Status: None (Preliminary result)   Collection Time: 2016/10/07  7:27 AM  Result Value Ref Range Status   Specimen Description BLOOD RIGHT WRIST  Final  Special Requests BOTTLES DRAWN AEROBIC AND ANAEROBIC  5CC  Final   Culture NO GROWTH 4 DAYS  Final   Report Status PENDING  Incomplete  Urine culture     Status: None   Collection Time: 08/21/2016  7:42 AM  Result Value Ref Range Status   Specimen Description URINE, CATHETERIZED  Final   Special Requests NONE  Final   Culture NO GROWTH  Final   Report Status 09/17/2016 FINAL  Final  MRSA PCR Screening     Status: Abnormal   Collection Time: 09/12/2016  8:11 PM  Result Value Ref Range Status   MRSA by PCR POSITIVE (A) NEGATIVE Final    Comment:        The GeneXpert MRSA Assay (FDA approved for NASAL specimens only), is one component of a comprehensive MRSA colonization surveillance program. It is not intended to diagnose MRSA infection nor to guide or monitor treatment for MRSA infections. RESULT CALLED TO, READ BACK BY AND VERIFIED WITH: M.YORK,RN AT 0102 09/17/16 BY L.PITT      Labs: Basic Metabolic Panel:  Recent Labs Lab 08/23/2016 1043  09/17/16 0319 09/17/16 1617 09/18/16 0044 09/18/16 0544 09/19/16 0406  NA 161*  < > 159* 161* 159* 159* 163*  K 4.8  < > 4.1 4.4 4.4 4.8 5.1  CL >130*  < > >130* >130* >130* >130* >130*  CO2 20*  < > 19* 18* 17* 17* 14*  GLUCOSE 210*  < > 127* 110* 146* 219*  228*  BUN 114*  < > 101* 90* 84* 84* 93*  CREATININE 5.17*  < > 4.80* 4.43* 4.35* 4.32* 4.79*  CALCIUM 7.5*  < > 8.4* 8.9 9.0 9.1 8.8*  MG 2.8*  --   --   --   --   --   --   PHOS 5.0*  --  4.1  --   --  4.8* 6.3*  < > = values in this interval not displayed. Liver Function Tests:  Recent Labs Lab 09/11/2016 0705 09/17/16 0319 09/18/16 0544 09/19/16 0406  AST 74*  --   --   --   ALT 98*  --   --   --   ALKPHOS 169*  --   --   --   BILITOT 0.7  --   --   --   PROT 7.0  --   --   --   ALBUMIN 3.6 2.4* 2.5* 2.1*   No results for input(s): LIPASE, AMYLASE in the last 168 hours. No results for input(s): AMMONIA in the last 168 hours. CBC:  Recent Labs Lab 09/05/2016 0705 09/17/16 0319 09/18/16 0544  WBC 20.1* 11.2* 9.4  NEUTROABS 17.3*  --   --   HGB 13.6 9.7* 9.7*  HCT 44.6 32.5* 32.3*  MCV 90.7 89.8 90.2  PLT 145* 62* 53*   Cardiac Enzymes:  Recent Labs Lab 09/01/2016 0705 08/25/2016 1043 08/27/2016 1715 09/06/2016 1802  CKTOTAL  --   --  58  --   TROPONINI 0.05* 0.05*  --  0.06*   D-Dimer No results for input(s): DDIMER in the last 72 hours. BNP: Invalid input(s): POCBNP CBG:  Recent Labs Lab 09/17/16 1748 09/17/16 2013 09/17/16 2311 09/18/16 0435 09/18/16 0752  GLUCAP 106* 105* 119* 183* 147*   Anemia work up No results for input(s): VITAMINB12, FOLATE, FERRITIN, TIBC, IRON, RETICCTPCT in the last 72 hours. Urinalysis    Component Value Date/Time   COLORURINE AMBER (A) 09/03/2016 0742   APPEARANCEUR TURBID (A)  09/24/2016 0742   LABSPEC 1.019 09-24-16 0742   PHURINE 5.0 09/24/2016 0742   GLUCOSEU NEGATIVE 09/24/2016 0742   HGBUR MODERATE (A) 24-Sep-2016 0742   BILIRUBINUR SMALL (A) 2016/09/24 0742   KETONESUR 15 (A) 2016/09/24 0742   PROTEINUR 100 (A) 09-24-16 0742   NITRITE NEGATIVE 24-Sep-2016 0742   LEUKOCYTESUR LARGE (A) September 24, 2016 0742   Sepsis Labs Invalid input(s): PROCALCITONIN,  WBC,  LACTICIDVEN     SIGNED:  Katrinka Blazing,  MD  [facility] 10/03/2016, 12:26 PM Pager [phone]  If 7PM-7AM, please contact night-coverage [email or web address] Password [email or web address]

## 2016-10-19 NOTE — Progress Notes (Signed)
Notified son Alexis Sanders when he arrived of the passing away of his mother. Son's father, patient's ex-husband Alexis Sanders, came in with son. They requested that chaplain come in for prayer. Support given.   Patient is a potential eye donor and eyes will be prepped once family is ready. [place] donner requested that eyes just be prepped before sending patient down to the morgue.

## 2016-10-19 NOTE — Progress Notes (Signed)
Met w/ family.  Read the Psalms at bedside.  Will follow, as needed.   - Rev. Chaplain   MDiv ThM 

## 2016-10-19 DEATH — deceased

## 2017-10-21 IMAGING — CT CT CHEST W/O CM
1 of 4 series · 15 of 46 positions shown, 17 images · IV contrast (isovue)
Comparison: None.

CLINICAL DATA: FUO, 65 y.o. female with past medical history of
parkinson's disease started on amantadine May 2016, DM2, Bipolar
disorder, hypothyroidism who was brought to [HOSPITAL] with
confusion, tremors for 3-4 days prior to th...*comment was
truncated*

EXAM:
CT CHEST, ABDOMEN, AND PELVIS WITHOUT  CONTRAST
TECHNIQUE: Multidetector CT imaging of the chest, abdomen and pelvis was
performed without administration of intravenous contrast.
CONTRAST:  100 mL Isovue

[Series 201: cap with, idose (2) · axial · 0.84mm/px · z∈[+68,+618]mm · 15 of 126 slices shown, 17 images]
[im 8/126  soft-tissue]
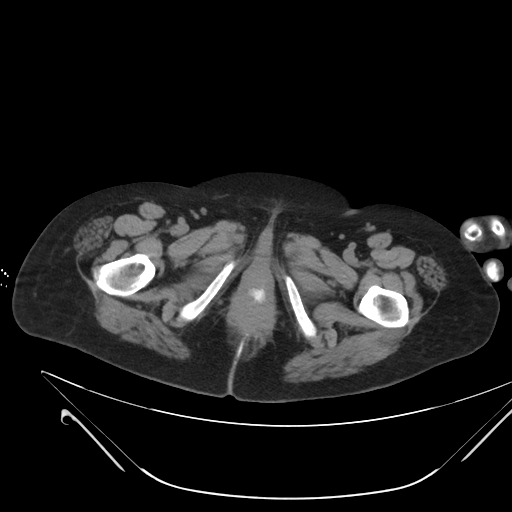
[im 8/126  bone]
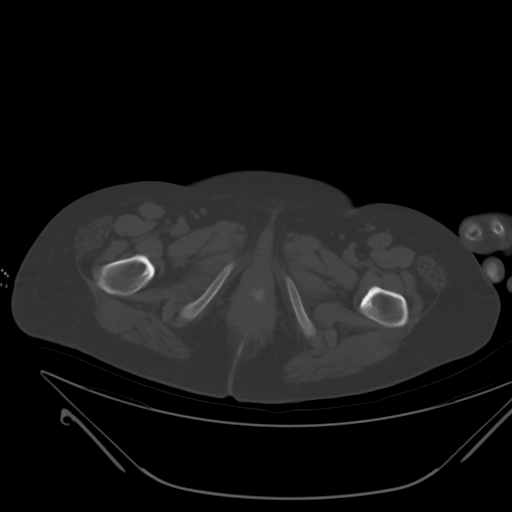
[im 16/126  soft-tissue]
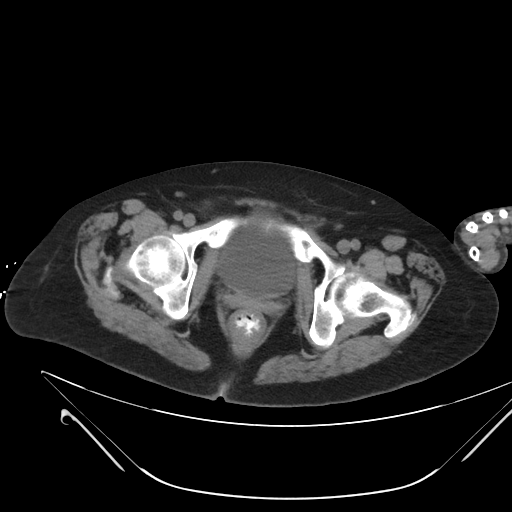
[im 24/126  soft-tissue]
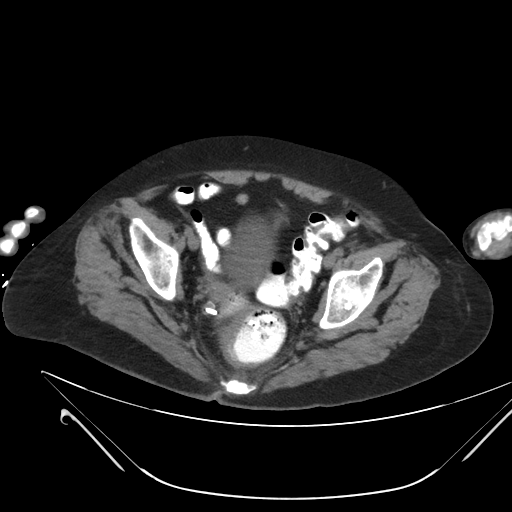
[im 32/126  soft-tissue]
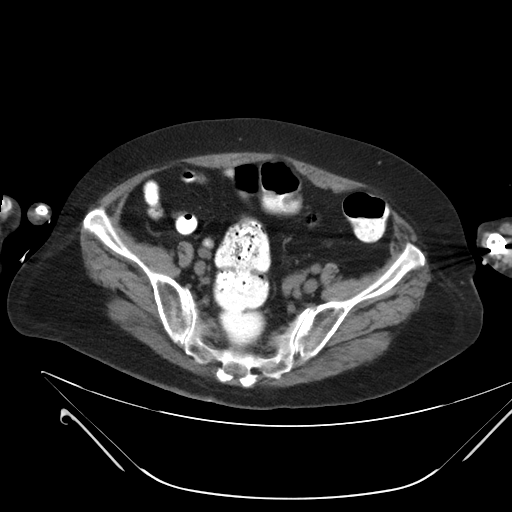
[im 40/126  soft-tissue]
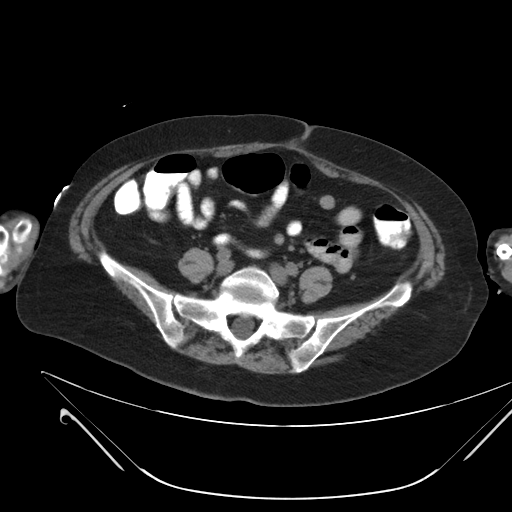
[im 47/126  soft-tissue]
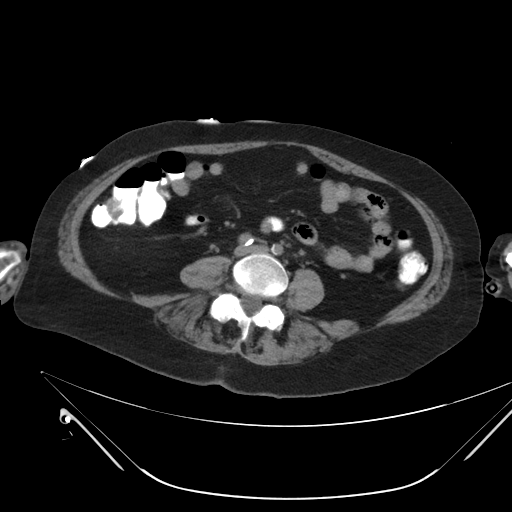
[im 55/126  soft-tissue]
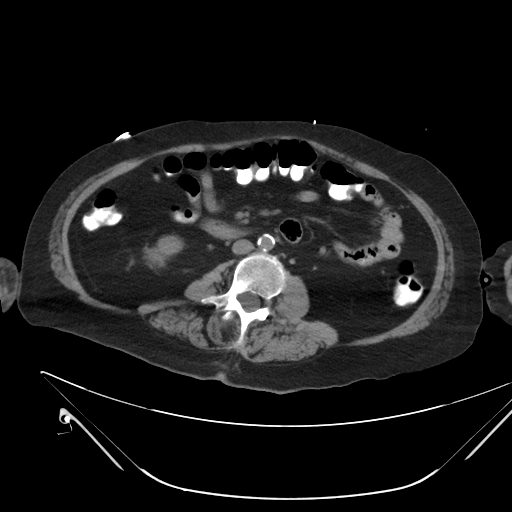
[im 63/126  soft-tissue]
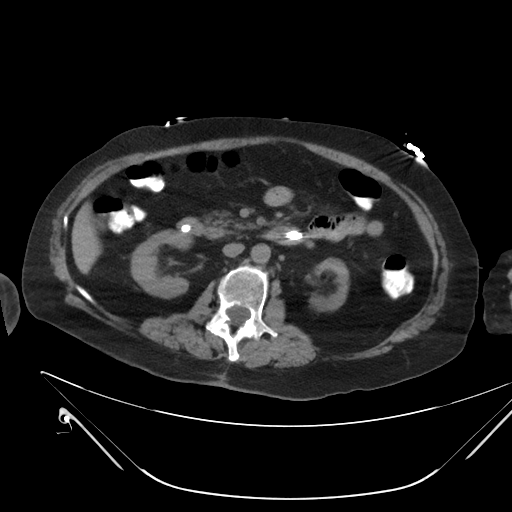
[im 71/126  soft-tissue]
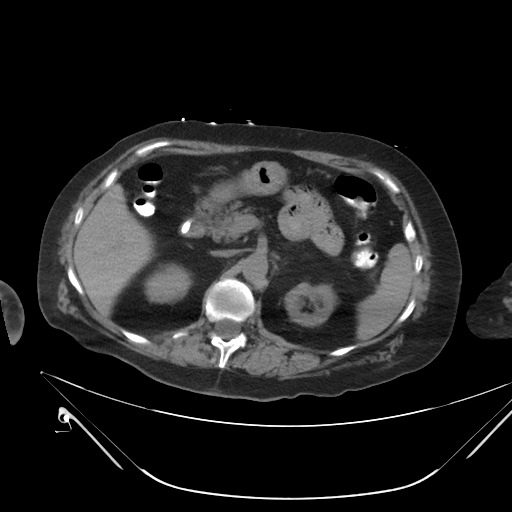
[im 71/126  bone]
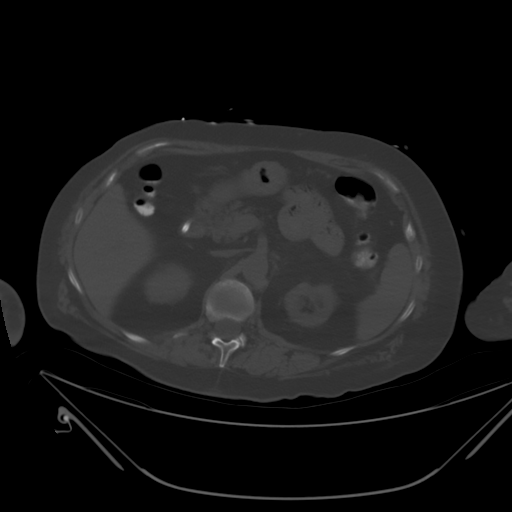
[im 79/126  soft-tissue]
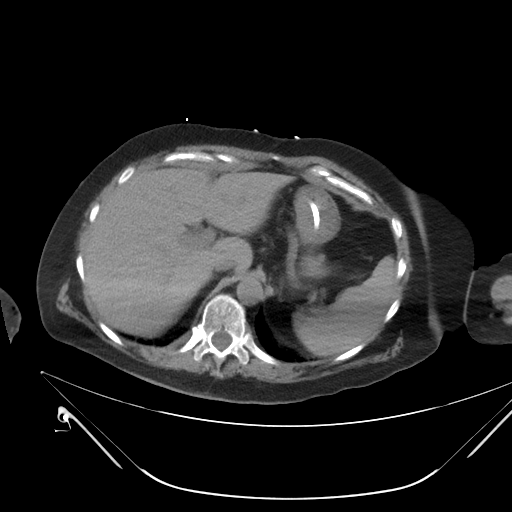
[im 86/126  soft-tissue]
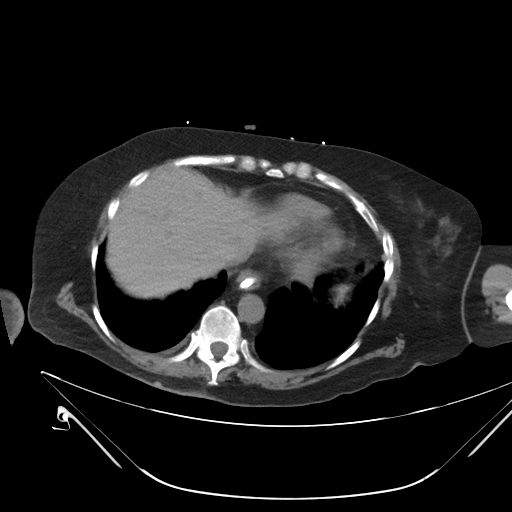
[im 94/126  soft-tissue]
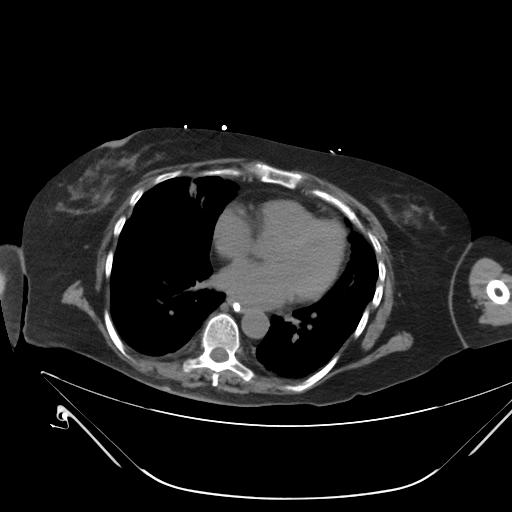
[im 102/126  soft-tissue]
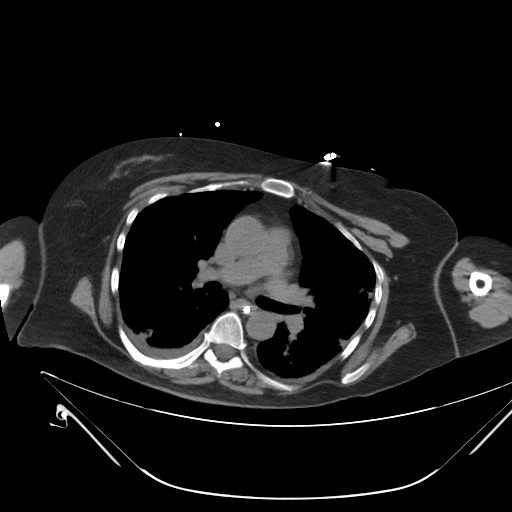
[im 110/126  soft-tissue]
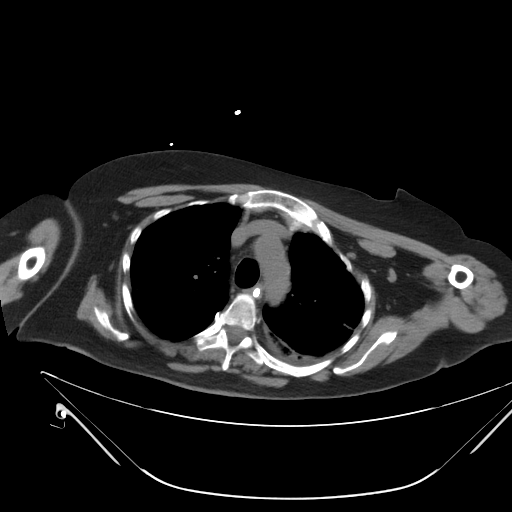
[im 118/126  soft-tissue]
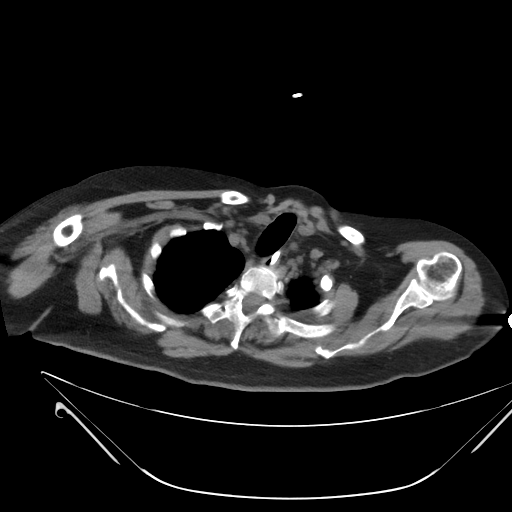

[15 of 46 positions shown; findings below may reference images not displayed]

FINDINGS: CT CHEST FINDINGS

Cardiovascular: No significant vascular calcifications. No
pericardial fluid.

Mediastinum/Nodes: No axillary supraclavicular adenopathy. No
mediastinal hilar adenopathy. Feeding tube extends in the esophagus.

Lungs/Pleura: There is bilateral patchy airspace disease. Underline
ground-glass opacities. Mild consolidation the LEFT lower lobe. No
discrete nodularity.

Musculoskeletal: No aggressive osseous lesion.

CT ABDOMEN AND PELVIS FINDINGS

Hepatobiliary: No focal hepatic lesions noncontrast exam. Post
cholecystectomy.

Pancreas: Pancreas is normal. No ductal dilatation. No pancreatic
inflammation.

Spleen: Normal spleen

Adrenals/urinary tract: Adrenal glands normal. Kidneys, ureters
bladder normal.

Stomach/Bowel: Feeding tube extends into the duodenum. Small bowel
chronic appendix and colon are normal.

Vascular/Lymphatic: Abdominal aorta is normal caliber with
atherosclerotic calcification. There is no retroperitoneal or
periportal lymphadenopathy. No pelvic lymphadenopathy.

Reproductive: Post hysterectomy

Other: No free fluid.

Musculoskeletal: No aggressive osseous lesion.
IMPRESSION: Chest Impression:

1. Bilateral patchy airspace densities suggests multi pulmonary
infection versus drug reaction.
2. Ground-glass opacities suggests underlying pulmonary edema.
3. Small bilateral pleural effusions.

Abdomen / Pelvis Impression:

1. No evidence infection in the abdomen or pelvis.
2. Feeding tube with tip in the duodenum.
3. No bowel obstruction.
